# Patient Record
Sex: Male | Born: 1975 | Race: Black or African American | Hispanic: No | Marital: Single | Smoking: Current every day smoker
Health system: Southern US, Community
[De-identification: ages and names within clinical notes are randomized; demographics above are authoritative.]

## PROBLEM LIST (undated history)

## (undated) DIAGNOSIS — L0291 Cutaneous abscess, unspecified: Secondary | ICD-10-CM

## (undated) DIAGNOSIS — I10 Essential (primary) hypertension: Secondary | ICD-10-CM

## (undated) HISTORY — PX: EYE SURGERY: SHX253

---

## 2003-09-13 ENCOUNTER — Other Ambulatory Visit: Payer: Self-pay

## 2008-05-14 ENCOUNTER — Emergency Department: Payer: Self-pay | Admitting: Emergency Medicine

## 2008-05-15 ENCOUNTER — Emergency Department: Payer: Self-pay | Admitting: Emergency Medicine

## 2008-12-04 DIAGNOSIS — H2010 Chronic iridocyclitis, unspecified eye: Secondary | ICD-10-CM | POA: Insufficient documentation

## 2009-10-29 ENCOUNTER — Emergency Department: Payer: Self-pay | Admitting: Emergency Medicine

## 2010-10-12 DIAGNOSIS — B0051 Herpesviral iridocyclitis: Secondary | ICD-10-CM | POA: Insufficient documentation

## 2010-10-12 DIAGNOSIS — H25049 Posterior subcapsular polar age-related cataract, unspecified eye: Secondary | ICD-10-CM | POA: Insufficient documentation

## 2010-10-12 DIAGNOSIS — H35039 Hypertensive retinopathy, unspecified eye: Secondary | ICD-10-CM | POA: Insufficient documentation

## 2010-10-12 DIAGNOSIS — H21549 Posterior synechiae (iris), unspecified eye: Secondary | ICD-10-CM | POA: Insufficient documentation

## 2010-10-12 DIAGNOSIS — H4040X0 Glaucoma secondary to eye inflammation, unspecified eye, stage unspecified: Secondary | ICD-10-CM | POA: Insufficient documentation

## 2011-09-29 ENCOUNTER — Ambulatory Visit: Payer: Self-pay | Admitting: Urology

## 2011-09-29 LAB — CBC
Platelet: 226 10*3/uL (ref 150–440)
RBC: 4.25 10*6/uL — ABNORMAL LOW (ref 4.40–5.90)
WBC: 10.2 10*3/uL (ref 3.8–10.6)

## 2011-09-29 LAB — BASIC METABOLIC PANEL
Anion Gap: 5 — ABNORMAL LOW (ref 7–16)
BUN: 7 mg/dL (ref 7–18)
Calcium, Total: 8.8 mg/dL (ref 8.5–10.1)
Co2: 29 mmol/L (ref 21–32)
Creatinine: 0.75 mg/dL (ref 0.60–1.30)
EGFR (African American): >60

## 2011-10-05 LAB — CULTURE, BLOOD (SINGLE)

## 2012-01-02 ENCOUNTER — Observation Stay: Payer: Self-pay | Admitting: Surgery

## 2012-01-02 LAB — CBC WITH DIFFERENTIAL/PLATELET
Basophil #: 0.1 10*3/uL (ref 0.0–0.1)
Eosinophil #: 0.1 10*3/uL (ref 0.0–0.7)
Eosinophil %: 0.6 %
HGB: 13.8 g/dL (ref 13.0–18.0)
Lymphocyte %: 19.1 %
MCHC: 33.1 g/dL (ref 32.0–36.0)
MCV: 88 fL (ref 80–100)
Monocyte %: 7.1 %
Neutrophil #: 15.3 10*3/uL — ABNORMAL HIGH (ref 1.4–6.5)
Platelet: 244 10*3/uL (ref 150–440)
RDW: 15.4 % — ABNORMAL HIGH (ref 11.5–14.5)
WBC: 21 10*3/uL — ABNORMAL HIGH (ref 3.8–10.6)

## 2012-01-02 LAB — BASIC METABOLIC PANEL
Calcium, Total: 8.8 mg/dL (ref 8.5–10.1)
Chloride: 106 mmol/L (ref 98–107)
Co2: 28 mmol/L (ref 21–32)
Creatinine: 0.79 mg/dL (ref 0.60–1.30)
EGFR (African American): >60
EGFR (Non-African Amer.): >60
Potassium: 3.7 mmol/L (ref 3.5–5.1)
Sodium: 138 mmol/L (ref 136–145)

## 2013-08-11 ENCOUNTER — Emergency Department: Payer: Self-pay | Admitting: Emergency Medicine

## 2013-08-12 LAB — SEDIMENTATION RATE: Erythrocyte Sed Rate: 25 mm/hr — ABNORMAL HIGH (ref 0–15)

## 2013-08-13 ENCOUNTER — Other Ambulatory Visit: Payer: Self-pay | Admitting: Ophthalmology

## 2013-08-13 LAB — GLUCOSE, RANDOM: GLUCOSE: 96 mg/dL (ref 65–99)

## 2013-08-13 LAB — HEMOGLOBIN A1C: HEMOGLOBIN A1C: 6.3 % (ref 4.2–6.3)

## 2014-05-21 NOTE — H&P (Signed)
Subjective/Chief Complaint 2 days pain on buttock.    History of Present Illness 39 year old male with hx HTN, diabetes, glaucoma and supposed hidradenitis presents with 2 days hx of painful swelling around right buttock.  He has not any BP mediciations in some time.  BP in ER SBP 190's.    Past History see abve.    Past Medical Health Hypertension, Diabetes Mellitus    Primary Physician [place].   Past Med/Surgical Hx:  cataracts:   glaucoma:   gerd:   htn:   drain placed in R eye:   ALLERGIES:  ASA: Hives, Swelling    Other Allergies none     Medications none   Family and Social History:   Family History Non-Contributory    Social History negative tobacco, negative ETOH   Review of Systems:   Subjective/Chief Complaint see above    Medications/Allergies Reviewed Medications/Allergies reviewed   Physical Exam:   GEN thin, temp 97.9 p 114, 195/140    HEENT pale conjunctivae    NECK supple    RESP normal resp effort  clear BS    CARD regular rate    ABD denies tenderness  no hernia  soft  large superficial right sided perianal abscess    LYMPH negative neck    SKIN normal to palpation    NEURO cranial nerves intact    PSYCH A+O to time, place, person   Lab Results:  Routine Chem:  01-Dec-13 08:11    Glucose, Serum  118   BUN 8   Creatinine (comp) 0.79   Sodium, Serum 138   Potassium, Serum 3.7   Chloride, Serum 106   CO2, Serum 28   Calcium (Total), Serum 8.8   Anion Gap  4   Osmolality (calc) 275   eGFR (African American) >60   eGFR (Non-African American) >60 (eGFR values <80mL/min/1.73 m2 may be an indication of chronic kidney disease (CKD). Calculated eGFR is useful in patients with stable renal function. The eGFR calculation will not be reliable in acutely ill patients when serum creatinine is changing rapidly. It is not useful in  patients on dialysis. The eGFR calculation may not be applicable to patients at the low  and high extremes of body sizes, pregnant women, and vegetarians.)  Routine Hem:  01-Dec-13 08:11    WBC (CBC)  21.0   RBC (CBC) 4.74   Hemoglobin (CBC) 13.8   Hematocrit (CBC) 41.7   Platelet Count (CBC) 244   MCV 88   MCH 29.1   MCHC 33.1   RDW  15.4   Neutrophil % 72.7   Lymphocyte % 19.1   Monocyte % 7.1   Eosinophil % 0.6   Basophil % 0.5   Neutrophil #  15.3   Lymphocyte #  4.0   Monocyte #  1.5   Eosinophil # 0.1   Basophil # 0.1 (Result(s) reported on 02 Jan 2012 at 08:24AM.)     Assessment/Admission Diagnosis 39 year old male with large right sided perianal abscess and poorly controlled hypertension.    Plan admit, enalipril and IV hydralazine, I and D latter today once BP better controlled.   Electronic Signatures: Sherri Rad (MD)  (Signed 01-Dec-13 14:53)  Authored: CHIEF COMPLAINT and HISTORY, PAST MEDICAL/SURGIAL HISTORY, ALLERGIES, Other Allergies, HOME MEDICATIONS, OTHER MEDICATIONS, FAMILY AND SOCIAL HISTORY, REVIEW OF SYSTEMS, PHYSICAL EXAM, LABS, ASSESSMENT AND PLAN   Last Updated: 01-Dec-13 14:53 by Sherri Rad (MD)

## 2014-05-21 NOTE — Op Note (Signed)
PATIENT NAME:  Joshua Tate, Joshua Tate MR#:  [id number] DATE OF BIRTH:  02/15/1975  DATE OF PROCEDURE:  09/29/2011  PREOPERATIVE DIAGNOSIS: Left scrotal wall abscess.   POSTOPERATIVE DIAGNOSIS: Left scrotal wall abscess.   PROCEDURE: Incision and drainage of scrotal wall abscess.   SURGEON: Scott C. Lonna CobbStoioff, MD  ASSISTANT: None.   ANESTHESIA: MAC.   INDICATION: 39 year old male presented to the Emergency Department with a six day history of a "boil" in his left scrotum. He has had progressive pain and discomfort but no fever or chills. He has a history of hydradenitis suppurativa and has had prior abscesses drained in the thigh and suprapubic area. Physical examination remarkable for approximately 3 cm area of fluctuance and tenderness in the left lateral scrotum.   DESCRIPTION OF PROCEDURE: Patient was taken to the Operating Room, placed in supine position. His external genitalia were prepped and draped in the usual fashion. IV sedation was obtained by anesthesia. The skin was incised overlying the fluctuant area and a moderate amount of slightly blood-tinged purulent material was drained. Intraoperative cultures were obtained. A hemostat was inserted into the abscess cavity and no loculations were identified. The abscess pocket was extended inferiorly and a second incision was made along the inferior aspect of this pocket. The site was copiously irrigated with GU irrigant. The cavity was packed with plain packing gauze. Dressing of fluffs, scrotal support was applied. Patient was taken to PAC-U in stable condition. There were no complications. EBL was minimal.   ____________________________ Verna CzechScott C. Lonna CobbStoioff, MD scs:cms D: 09/29/2011 22:27:14 ET T: 09/30/2011 09:37:52 ET  JOB#: [id number] cc: Lorin PicketScott C. Lonna CobbStoioff, MD, <Dictator> Riki AltesSCOTT C STOIOFF MD ELECTRONICALLY SIGNED 10/08/2011 12:09

## 2014-05-21 NOTE — Op Note (Signed)
PATIENT NAME:  Joshua Tate, Joshua Tate MR#:  [id number] DATE OF BIRTH:  [birth year]  DATE OF PROCEDURE:  01/02/2012  PREOPERATIVE DIAGNOSIS: Perianal abscess.  POSTOPERATIVE DIAGNOSIS: Perianal abscess, likely secondary to hidradenitis suppurativa.  OPERATION PERFORMED:  1. Rectal and anal examination under anesthesia.  2. Anoscopy.  3. Incision and drainage of large perianal abscess.   SURGEON: Claude MangesWilliam F. Rotunda Worden, MD   ANESTHESIA: General.   PROCEDURE IN DETAIL: The patient was placed in the lithotomy position and prepped and draped in the usual sterile fashion. A digital rectal examination failed to reveal any abnormalities or induration within the anal canal. An anoscopy was performed, and there was no obvious crypt or pus or induration within the anal canal, particularly posteriorly. The patient's abscess cavity was in the posterior midline going toward the natal cleft; however, there was no evidence of pit formation or any evidence of pilonidal disease. There was no induration within the lateral aspects of the ischiorectal space. A small cruciate incision was made in the middle of the indurated skin, and a large amount of dark brown odorless pus was drained. The cavity was irrigated with copious amounts of hydrogen peroxide, and hemostasis was achieved with electrocautery. A full roll of 2-inch Kling was then placed into the cavity with just about an inch left hanging out, and 4 x 4's and mesh panties completed the procedure. The patient tolerated the procedure well. There were no complications.  ____________________________ Claude MangesWilliam F. Kathleen Tamm, MD wfm:cbb D: 01/02/2012 20:42:43 ET T: 01/03/2012 09:36:22 ET JOB#: [id number]  cc: Claude MangesWilliam F. Raeden Schippers, MD, <Dictator> Claude MangesWILLIAM F Esias Mory MD ELECTRONICALLY SIGNED 01/12/2012 7:53

## 2014-05-21 NOTE — H&P (Signed)
    Subjective/Chief Complaint left scrotal pain    History of Present Illness Presents with a 5-6 day h/o a "boil" left scrotum which has progressively enlarged and become more painful. Worse with movement, intensity severe.  Denies fever.  Past h/o skin abscesses thigh and lower abd requiring I&D. Has adult onset diabetes on no meds.    Past Medical Health Hypertension, Diabetes Mellitus   Past Med/Surgical Hx:  cataracts:   glaucoma:   gerd:   htn:   drain placed in R eye:   ALLERGIES:  ASA: Hives, Swelling  HOME MEDICATIONS: Medication Instructions Status  Cosopt ophthalmic solution   once a day  Active  cyclogyl   once a day  Active  predaforte   4 times a day  Active  enalapril 40 mg oral tablet   once a day  Active  nexium   once a day  Active   Family and Social History:   Family History Non-Contributory    Social History positive  tobacco    Place of Living Home   Review of Systems:   Subjective/Chief Complaint scrota pain    Fever/Chills No    Cough No    Sputum No    Abdominal Pain No    Diarrhea No    Constipation No    Nausea/Vomiting No    SOB/DOE No    Chest Pain No    Dysuria No    Tolerating Diet Yes    Medications/Allergies Reviewed Medications/Allergies reviewed   Physical Exam:   GEN well developed, well nourished, mid distress secondary to pain    HEENT pink conjunctivae, moist oral mucosa, Oropharynx clear    NECK supple  No masses    RESP clear BS  no use of accessory muscles    CARD regular rate  no murmur  No LE edema    ABD denies tenderness  no liver/spleen enlargement  soft    EXTR negative edema    SKIN No rashes, skin turgor good    NEURO motor/sensory function intact    PSYCH alert, A+O to time, place, person, good insight    Additional Comments GU- penis nml. Testes descended nml.  Fluctuent, tender left scrotal wall abscess laterally measuring ~ 3X5 cm.  no necrosis     Assessment/Admission Diagnosis  left scrotal wall abscess    Plan Incision and drainage- the procedure was discussed including the need for wound care/ dressing changes.  He desires to proceed   Electronic Signatures: Riki AltesStoioff, Denea Cheaney C (MD)  (Signed 28-Aug-13 12:37)  Authored: CHIEF COMPLAINT and HISTORY, PAST MEDICAL/SURGIAL HISTORY, ALLERGIES, HOME MEDICATIONS, FAMILY AND SOCIAL HISTORY, REVIEW OF SYSTEMS, PHYSICAL EXAM, ASSESSMENT AND PLAN   Last Updated: 28-Aug-13 12:37 by Riki AltesStoioff, Koralynn Greenspan C (MD)

## 2015-09-26 ENCOUNTER — Emergency Department: Payer: Medicaid Other

## 2015-09-26 ENCOUNTER — Emergency Department: Payer: Medicaid Other | Admitting: Anesthesiology

## 2015-09-26 ENCOUNTER — Encounter
Admission: EM | Discharge status: Against Medical Advice | Payer: Self-pay | Source: Home / Self Care | Attending: Emergency Medicine

## 2015-09-26 ENCOUNTER — Encounter: Payer: Self-pay | Admitting: Emergency Medicine

## 2015-09-26 ENCOUNTER — Observation Stay
Admission: EM | Admit: 2015-09-26 | Discharge: 2015-09-26 | Discharge status: Against Medical Advice | Payer: Medicaid Other | Attending: General Surgery | Admitting: General Surgery

## 2015-09-26 DIAGNOSIS — K611 Rectal abscess: Principal | ICD-10-CM | POA: Diagnosis present

## 2015-09-26 DIAGNOSIS — F17290 Nicotine dependence, other tobacco product, uncomplicated: Secondary | ICD-10-CM | POA: Diagnosis not present

## 2015-09-26 DIAGNOSIS — L0291 Cutaneous abscess, unspecified: Secondary | ICD-10-CM | POA: Diagnosis present

## 2015-09-26 DIAGNOSIS — Z79899 Other long term (current) drug therapy: Secondary | ICD-10-CM | POA: Diagnosis not present

## 2015-09-26 DIAGNOSIS — I10 Essential (primary) hypertension: Secondary | ICD-10-CM | POA: Diagnosis not present

## 2015-09-26 HISTORY — DX: Essential (primary) hypertension: I10

## 2015-09-26 HISTORY — DX: Cutaneous abscess, unspecified: L02.91

## 2015-09-26 HISTORY — PX: INCISION AND DRAINAGE ABSCESS: SHX5864

## 2015-09-26 LAB — CBC WITH DIFFERENTIAL/PLATELET
Basophils Absolute: 0.1 10*3/uL (ref 0–0.1)
Basophils Relative: 1 %
EOS ABS: 0.2 10*3/uL (ref 0–0.7)
EOS PCT: 2 %
HCT: 37.5 % — ABNORMAL LOW (ref 40.0–52.0)
Hemoglobin: 12.8 g/dL — ABNORMAL LOW (ref 13.0–18.0)
LYMPHS ABS: 2.6 10*3/uL (ref 1.0–3.6)
Lymphocytes Relative: 19 %
MCH: 30.4 pg (ref 26.0–34.0)
MCHC: 34.2 g/dL (ref 32.0–36.0)
MCV: 88.9 fL (ref 80.0–100.0)
MONOS PCT: 8 %
Monocytes Absolute: 1.2 10*3/uL — ABNORMAL HIGH (ref 0.2–1.0)
Neutro Abs: 9.9 10*3/uL — ABNORMAL HIGH (ref 1.4–6.5)
Neutrophils Relative %: 70 %
PLATELETS: 249 10*3/uL (ref 150–440)
RBC: 4.22 MIL/uL — ABNORMAL LOW (ref 4.40–5.90)
RDW: 15.3 % — ABNORMAL HIGH (ref 11.5–14.5)
WBC: 14 10*3/uL — AB (ref 3.8–10.6)

## 2015-09-26 LAB — BASIC METABOLIC PANEL
Anion gap: 8 (ref 5–15)
BUN: 8 mg/dL (ref 6–20)
CALCIUM: 8.3 mg/dL — AB (ref 8.9–10.3)
CO2: 25 mmol/L (ref 22–32)
CREATININE: 0.57 mg/dL — AB (ref 0.61–1.24)
Chloride: 107 mmol/L (ref 101–111)
GFR calc Af Amer: >60 mL/min (ref >60)
Glucose, Bld: 95 mg/dL (ref 65–99)
Potassium: 3.3 mmol/L — ABNORMAL LOW (ref 3.5–5.1)
SODIUM: 140 mmol/L (ref 135–145)

## 2015-09-26 SURGERY — INCISION AND DRAINAGE, ABSCESS
Anesthesia: General | Site: Rectum | Wound class: Contaminated

## 2015-09-26 MED ORDER — FENTANYL CITRATE (PF) 100 MCG/2ML IJ SOLN
INTRAMUSCULAR | Status: AC
  Filled 2015-09-26: qty 2

## 2015-09-26 MED ORDER — LACTATED RINGERS IV SOLN
INTRAVENOUS | Status: DC | PRN
  Administered 2015-09-26: 19:00:00 via INTRAVENOUS

## 2015-09-26 MED ORDER — ONDANSETRON 4 MG PO TBDP: 4.0000 mg | ORAL_TABLET | Freq: Four times a day (QID) | ORAL | Status: DC | PRN

## 2015-09-26 MED ORDER — VANCOMYCIN HCL 10 G IV SOLR
1250.0000 mg | Freq: Once | INTRAVENOUS | Status: DC
  Filled 2015-09-26: qty 1250

## 2015-09-26 MED ORDER — ZOLPIDEM TARTRATE 5 MG PO TABS
5.0000 mg | ORAL_TABLET | Freq: Every evening | ORAL | Status: DC | PRN
Start: 2015-09-26 — End: 2015-09-27

## 2015-09-26 MED ORDER — LIDOCAINE HCL (CARDIAC) 20 MG/ML IV SOLN
INTRAVENOUS | Status: DC | PRN
  Administered 2015-09-26: 100 mg via INTRAVENOUS

## 2015-09-26 MED ORDER — HYDROCODONE-ACETAMINOPHEN 5-325 MG PO TABS: 1.0000 | ORAL_TABLET | ORAL | Status: DC | PRN

## 2015-09-26 MED ORDER — PROMETHAZINE HCL 25 MG/ML IJ SOLN: 6.2500 mg | INTRAMUSCULAR | Status: DC | PRN

## 2015-09-26 MED ORDER — SODIUM CHLORIDE 0.9 % IV SOLN
1250.0000 mg | Freq: Three times a day (TID) | INTRAVENOUS | Status: DC
  Filled 2015-09-26 (×2): qty 1250

## 2015-09-26 MED ORDER — MEPERIDINE HCL 25 MG/ML IJ SOLN: 6.2500 mg | INTRAMUSCULAR | Status: DC | PRN

## 2015-09-26 MED ORDER — DEXAMETHASONE SODIUM PHOSPHATE 10 MG/ML IJ SOLN
INTRAMUSCULAR | Status: DC | PRN
  Administered 2015-09-26: 10 mg via INTRAVENOUS

## 2015-09-26 MED ORDER — MORPHINE SULFATE (PF) 4 MG/ML IV SOLN: 4.0000 mg | INTRAVENOUS | Status: DC | PRN

## 2015-09-26 MED ORDER — MIDAZOLAM HCL 2 MG/2ML IJ SOLN
INTRAMUSCULAR | Status: DC | PRN
Start: 2015-09-26 — End: 2015-09-26
  Administered 2015-09-26: 2 mg via INTRAVENOUS

## 2015-09-26 MED ORDER — DIPHENHYDRAMINE HCL 50 MG/ML IJ SOLN: 25.0000 mg | Freq: Four times a day (QID) | INTRAMUSCULAR | Status: DC | PRN

## 2015-09-26 MED ORDER — DIATRIZOATE MEGLUMINE & SODIUM 66-10 % PO SOLN: 15.0000 mL | Freq: Once | ORAL | Status: DC

## 2015-09-26 MED ORDER — ONDANSETRON HCL 4 MG/2ML IJ SOLN: 4.0000 mg | Freq: Four times a day (QID) | INTRAMUSCULAR | Status: DC | PRN

## 2015-09-26 MED ORDER — MORPHINE SULFATE (PF) 4 MG/ML IV SOLN
4.0000 mg | Freq: Once | INTRAVENOUS | Status: AC
Start: 2015-09-26 — End: 2015-09-26
  Administered 2015-09-26: 4 mg via INTRAVENOUS
  Filled 2015-09-26: qty 1

## 2015-09-26 MED ORDER — CEFAZOLIN SODIUM 1 G IJ SOLR
INTRAMUSCULAR | Status: DC | PRN
  Administered 2015-09-26: 2 g via INTRAMUSCULAR

## 2015-09-26 MED ORDER — MORPHINE SULFATE (PF) 4 MG/ML IV SOLN
4.0000 mg | Freq: Once | INTRAVENOUS | Status: AC
  Administered 2015-09-26: 4 mg via INTRAVENOUS
  Filled 2015-09-26: qty 1

## 2015-09-26 MED ORDER — PIPERACILLIN-TAZOBACTAM 3.375 G IVPB
3.3750 g | Freq: Three times a day (TID) | INTRAVENOUS | Status: DC
  Filled 2015-09-26 (×3): qty 50

## 2015-09-26 MED ORDER — ONDANSETRON HCL 4 MG/2ML IJ SOLN
INTRAMUSCULAR | Status: DC | PRN
  Administered 2015-09-26: 4 mg via INTRAVENOUS

## 2015-09-26 MED ORDER — FENTANYL CITRATE (PF) 100 MCG/2ML IJ SOLN
INTRAMUSCULAR | Status: DC | PRN
  Administered 2015-09-26: 50 ug via INTRAVENOUS
  Administered 2015-09-26 (×2): 25 ug via INTRAVENOUS
  Administered 2015-09-26: 100 ug via INTRAVENOUS

## 2015-09-26 MED ORDER — IOPAMIDOL (ISOVUE-300) INJECTION 61%
100.0000 mL | Freq: Once | INTRAVENOUS | Status: AC | PRN
  Administered 2015-09-26: 100 mL via INTRAVENOUS

## 2015-09-26 MED ORDER — HYDRALAZINE HCL 20 MG/ML IJ SOLN: 10.0000 mg | INTRAMUSCULAR | Status: DC | PRN

## 2015-09-26 MED ORDER — OXYCODONE HCL 5 MG PO TABS: 5.0000 mg | ORAL_TABLET | Freq: Once | ORAL | Status: DC | PRN

## 2015-09-26 MED ORDER — LACTATED RINGERS IV SOLN: INTRAVENOUS | Status: DC

## 2015-09-26 MED ORDER — FENTANYL CITRATE (PF) 100 MCG/2ML IJ SOLN
25.0000 ug | INTRAMUSCULAR | Status: DC | PRN
  Administered 2015-09-26 (×4): 25 ug via INTRAVENOUS

## 2015-09-26 MED ORDER — SODIUM CHLORIDE 0.9 % IV BOLUS (SEPSIS)
1000.0000 mL | Freq: Once | INTRAVENOUS | Status: AC
  Administered 2015-09-26: 1000 mL via INTRAVENOUS

## 2015-09-26 MED ORDER — PROPOFOL 10 MG/ML IV BOLUS
INTRAVENOUS | Status: DC | PRN
  Administered 2015-09-26: 200 mg via INTRAVENOUS

## 2015-09-26 MED ORDER — DIATRIZOATE MEGLUMINE & SODIUM 66-10 % PO SOLN
30.0000 mL | ORAL | Status: AC
Start: 2015-09-26 — End: 2015-09-26
  Administered 2015-09-26: 30 mL via ORAL

## 2015-09-26 MED ORDER — OXYCODONE HCL 5 MG/5ML PO SOLN: 5.0000 mg | Freq: Once | ORAL | Status: DC | PRN

## 2015-09-26 MED ORDER — DIPHENHYDRAMINE HCL 25 MG PO CAPS: 25.0000 mg | ORAL_CAPSULE | Freq: Four times a day (QID) | ORAL | Status: DC | PRN

## 2015-09-26 SURGICAL SUPPLY — 22 items
BLADE CLIPPER SURG (BLADE) IMPLANT
BLADE SURG 15 STRL LF DISP TIS (BLADE) ×1 IMPLANT
BLADE SURG 15 STRL SS (BLADE) ×2
BRIEF STRETCH MATERNITY 2XLG (MISCELLANEOUS) ×3 IMPLANT
CANISTER SUCT 3000ML (MISCELLANEOUS) ×3 IMPLANT
DRAIN PENROSE 1/4X12 LTX (DRAIN) ×3 IMPLANT
ELECT REM PT RETURN 9FT ADLT (ELECTROSURGICAL) ×3
ELECTRODE REM PT RTRN 9FT ADLT (ELECTROSURGICAL) ×1 IMPLANT
GLOVE BIO SURGEON STRL SZ7 (GLOVE) ×12 IMPLANT
GOWN STRL REUS W/ TWL LRG LVL3 (GOWN DISPOSABLE) ×2 IMPLANT
GOWN STRL REUS W/TWL LRG LVL3 (GOWN DISPOSABLE) ×4
NEEDLE HYPO 22GX1.5 SAFETY (NEEDLE) ×3 IMPLANT
NS IRRIG 1000ML POUR BTL (IV SOLUTION) ×3 IMPLANT
PACK BASIN MINOR ARMC (MISCELLANEOUS) ×3 IMPLANT
PAD ABD DERMACEA PRESS 5X9 (GAUZE/BANDAGES/DRESSINGS) ×3 IMPLANT
SCRUB POVIDONE IODINE 4 OZ (MISCELLANEOUS) IMPLANT
SOL PREP PVP 2OZ (MISCELLANEOUS) ×3
SOLUTION PREP PVP 2OZ (MISCELLANEOUS) ×1 IMPLANT
SUT ETHILON 3-0 FS-10 30 BLK (SUTURE) ×3
SUTURE EHLN 3-0 FS-10 30 BLK (SUTURE) ×1 IMPLANT
SWAB CULTURE AMIES ANAERIB BLU (MISCELLANEOUS) ×3 IMPLANT
SYR BULB EAR ULCER 3OZ GRN STR (SYRINGE) ×3 IMPLANT

## 2015-09-26 NOTE — Anesthesia Preprocedure Evaluation (Signed)
Anesthesia Evaluation  Patient identified by MRN, date of birth, ID band Patient awake    Reviewed: Allergy & Precautions, NPO status , Patient's Chart, lab work & pertinent test results  History of Anesthesia Complications Negative for: history of anesthetic complications  Airway Mallampati: II  TM Distance: >3 FB Neck ROM: Full    Dental  (+) Poor Dentition   Pulmonary neg sleep apnea, neg COPD, Current Smoker,    breath sounds clear to auscultation- rhonchi (-) decreased breath sounds(-) wheezing      Cardiovascular hypertension, (-) CAD and (-) Past MI  Rhythm:Regular Rate:Normal - Systolic murmurs and - Diastolic murmurs    Neuro/Psych negative neurological ROS  negative psych ROS   GI/Hepatic negative GI ROS, Neg liver ROS,   Endo/Other  negative endocrine ROSneg diabetes  Renal/GU negative Renal ROS     Musculoskeletal negative musculoskeletal ROS (+)   Abdominal (+) - obese,   Peds  Hematology negative hematology ROS (+)   Anesthesia Other Findings   Reproductive/Obstetrics                             Anesthesia Physical Anesthesia Plan  ASA: II  Anesthesia Plan: General   Post-op Pain Management:    Induction: Intravenous  Airway Management Planned: LMA  Additional Equipment:   Intra-op Plan:   Post-operative Plan:   Informed Consent: I have reviewed the patients History and Physical, chart, labs and discussed the procedure including the risks, benefits and alternatives for the proposed anesthesia with the patient or authorized representative who has indicated his/her understanding and acceptance.   Dental advisory given  Plan Discussed with: Anesthesiologist and CRNA  Anesthesia Plan Comments:         Anesthesia Quick Evaluation

## 2015-09-26 NOTE — Op Note (Signed)
   Pre-operative Diagnosis: Perirectal abscess  Post-operative Diagnosis: Same  Surgeon: Joshua Frameharles Khyrin Trevathan   Assistants: None  Anesthesia: General LMA anesthesia  ASA Class: 2  Surgeon: Joshua Frameharles Avaleen Brownley, MD FACS  Anesthesia: Gen. with endotracheal tube  Assistant: None  Procedure Details  The patient was seen again in the Holding Room. The benefits, complications, treatment options, and expected outcomes were discussed with the patient. The risks of bleeding, infection, recurrence of symptoms, failure to resolve symptoms, any of which could require further surgery were reviewed with the patient.   The patient was taken to Operating Room, identified as Joshua Tate and the procedure verified.  A Time Out was held and the above information confirmed.  Prior to the induction of general anesthesia, antibiotic prophylaxis was administered. VTE prophylaxis was in place. General endotracheal anesthesia was then administered and tolerated well. After the induction, the perineum was prepped with Betadine and draped in the sterile fashion. The patient was positioned in the high lithotomy position.  The obvious abscess was again palpated for its borders. The most inferior aspect was then incised with a 15 blade scalpel. Purulent material was immediately returned and was cultured. A counterincision was made on the superior aspect. Through these 2 incisions the abscess was decompressed without any difficulty. The entire cavity was then copiously irrigated with normal saline. Due to the size of the abscess the decision was made to place a Penrose drain that traversed the abscess exiting both stab wounds. The Penrose drain was sewn into place with a 3-0 nylon suture.  With the abscess drained patient was returned to a supine position. A sterile dressing of ABD pads and mesh underwear was placed over this. Patient was awoken from general anesthesia and transferred to the PACU in good condition. All counts  were correct at the end of the procedure. There were no immediate complications.  Findings: Large left-sided perirectal abscess   Estimated Blood Loss: 10 mL         Drains: Quarter-inch Penrose drain         Specimens: Culture for abscess          Complications: None                  Condition: Good   Joshua Frameharles Olney Monier, MD, FACS

## 2015-09-26 NOTE — Progress Notes (Signed)
Pt states he is leaving, nursing supervisor in to see pt explained risks, nursing staff explained risks to leaving, bleeding potential, risk of infection, iv removed, AMA form signed.

## 2015-09-26 NOTE — Anesthesia Procedure Notes (Signed)
Procedure Name: LMA Insertion Date/Time: 09/26/2015 7:51 PM Performed by: Junious SilkNOLES, Julieanna Geraci Pre-anesthesia Checklist: Patient identified, Patient being monitored, Timeout performed, Emergency Drugs available and Suction available Patient Re-evaluated:Patient Re-evaluated prior to inductionOxygen Delivery Method: Circle system utilized Preoxygenation: Pre-oxygenation with 100% oxygen Intubation Type: IV induction Ventilation: Mask ventilation without difficulty LMA: LMA inserted LMA Size: 4.5 Tube type: Oral Number of attempts: 1 Placement Confirmation: positive ETCO2 and breath sounds checked- equal and bilateral Tube secured with: Tape Dental Injury: Teeth and Oropharynx as per pre-operative assessment

## 2015-09-26 NOTE — ED Notes (Signed)
Dr. Everlene FarrierPabon at bedside with patient consenting him for surgery.

## 2015-09-26 NOTE — ED Triage Notes (Signed)
Pt states he has hx of abscess to rectal area and gets them every 3 months or so. Pt states sometimes he gets them drained other times he has to have surgery.  Current abscess started about 2 days ago and pt states "this is the worst one yet" Pt unable to sit without pain. Denies any hx of MRSA.  Pt states the pain is 10/10

## 2015-09-26 NOTE — ED Provider Notes (Signed)
[facility] Emergency Department Provider Note    ____________________________________________   I have reviewed the triage vital signs and the nursing notes.   HISTORY  Chief Complaint Abscess   History limited by: Not Limited   HPI Joshua Tate is a 40 y.o. male who presents to the emergency department today because of concerns for groin pain and swelling. The patient states that he started developing an abscess roughly 3 days ago. It is located in the perineum. He states he has a history of abscesses in this area and gets them roughly once every 3 months. He is usually able to treat him at home with warm compresses. He tried that without any relief. He also states that he has been having some pain in his scrotum and some difficulty with urination. He denies any fevers.   Past Medical History:  Diagnosis Date  . Abscess    to rectum  . Hypertension     There are no active problems to display for this patient.   History reviewed. No pertinent surgical history.  Prior to Admission medications   Not on File    Allergies Review of patient's allergies indicates no known allergies.  History reviewed. No pertinent family history.  Social History Social History  Substance Use Topics  . Smoking status: Current Every Day Smoker    Types: Cigars  . Smokeless tobacco: Never Used  . Alcohol use Yes    Review of Systems  Constitutional: Negative for fever. Cardiovascular: Negative for chest pain. Respiratory: Negative for shortness of breath. Gastrointestinal: Negative for abdominal pain, vomiting and diarrhea. Genitourinary: Positive for scrotal pain, perineum swelling Neurological: Negative for headaches, focal weakness or numbness.  10-point ROS otherwise negative.  ____________________________________________   PHYSICAL EXAM:  VITAL SIGNS: ED Triage Vitals  Enc Vitals Group     BP 09/26/15 0737 (!) 164/117     Pulse Rate  09/26/15 0737 97     Resp 09/26/15 0737 (!) 22     Temp 09/26/15 0737 97.6 F (36.4 C)     Temp Source 09/26/15 0737 Oral     SpO2 09/26/15 0737 98 %     Weight 09/26/15 0737 185 lb (83.9 kg)     Height 09/26/15 0737 6' (1.829 m)     Head Circumference --      Peak Flow --      Pain Score 09/26/15 0738 10   Constitutional: Alert and oriented. Well appearing and in no distress. Eyes: Conjunctivae are normal. PERRL. Normal extraocular movements. ENT   Head: Normocephalic and atraumatic.   Nose: No congestion/rhinnorhea.   Mouth/Throat: Mucous membranes are moist.   Neck: No stridor. Hematological/Lymphatic/Immunilogical: No cervical lymphadenopathy. Cardiovascular: Normal rate, regular rhythm.  No murmurs, rubs, or gallops. Respiratory: Normal respiratory effort without tachypnea nor retractions. Breath sounds are clear and equal bilaterally. No wheezes/rales/rhonchi. Gastrointestinal: Soft and nontender. No distention. There is no CVA tenderness. Genitourinary: Deferred Musculoskeletal: Normal range of motion in all extremities. No joint effusions.  No lower extremity tenderness nor edema. Neurologic:  Normal speech and language. No gross focal neurologic deficits are appreciated.  Skin:  Skin is warm, dry and intact. No rash noted. Psychiatric: Mood and affect are normal. Speech and behavior are normal. Patient exhibits appropriate insight and judgment.  ____________________________________________    LABS (pertinent positives/negatives)  Labs Reviewed  CBC WITH DIFFERENTIAL/PLATELET - Abnormal; Notable for the following:       Result Value   WBC 14.0 (*)  RBC 4.22 (*)    Hemoglobin 12.8 (*)    HCT 37.5 (*)    RDW 15.3 (*)    Neutro Abs 9.9 (*)    Monocytes Absolute 1.2 (*)    All other components within normal limits  BASIC METABOLIC PANEL - Abnormal; Notable for the following:    Potassium 3.3 (*)    Creatinine, Ser 0.57 (*)    Calcium 8.3 (*)    All  other components within normal limits     ____________________________________________   EKG  None  ____________________________________________    RADIOLOGY  CT pelvis   IMPRESSION:  Irregular low-density area within the left scrotum concerning for  abscess. Surrounding inflammatory change in the left scrotum and  extending into the left perineum, likely cellulitis.    Cardiomegaly.    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     ____________________________________________   PROCEDURES  Procedures  ____________________________________________   INITIAL IMPRESSION / ASSESSMENT AND PLAN / ED COURSE  Pertinent labs & imaging results that were available during my care of the patient were reviewed by me and considered in my medical decision making (see chart for details).  Patient presents to the emergency department today because of concerns for abscess in the perineum. Does have a large fluctuant mass there. CT scan is consistent with an abscess. Patient will be admitted by surgery.   ____________________________________________   FINAL CLINICAL IMPRESSION(S) / ED DIAGNOSES  Final diagnoses:  Abscess     Note: This dictation was prepared with Dragon dictation. Any transcriptional errors that result from this process are unintentional    Phineas Semen, MD 09/26/15 1247

## 2015-09-26 NOTE — Anesthesia Postprocedure Evaluation (Signed)
Anesthesia Post Note  Patient: Joshua HousekeeperJohn G Tate  Procedure(s) Performed: Procedure(s) (LRB): INCISION AND DRAINAGE perirectal  ABSCESS (N/A)  Patient location during evaluation: PACU Anesthesia Type: General Level of consciousness: awake and alert and oriented Pain management: pain level controlled Vital Signs Assessment: post-procedure vital signs reviewed and stable Respiratory status: spontaneous breathing, nonlabored ventilation and respiratory function stable Cardiovascular status: blood pressure returned to baseline and stable Postop Assessment: no signs of nausea or vomiting Anesthetic complications: no    Last Vitals:  Vitals:   09/26/15 2100 09/26/15 2105  BP: (!) 156/97 (!) 162/104  Pulse: 88 97  Resp: (!) 22 (!) 24  Temp:      Last Pain:  Vitals:   09/26/15 2100  TempSrc:   PainSc: 2                  Lonie Rummell

## 2015-09-26 NOTE — ED Notes (Signed)
RN to room to update patient. Patient was upset that he has been waiting so long to go to surgery. I explained to patient that the surgeon had an emergency come up and that per OR he should be the next patient to be taken. Patient was c/o dry mouth. RN gave patient mouth swabs and mouth moisturizer.

## 2015-09-26 NOTE — Transfer of Care (Signed)
Immediate Anesthesia Transfer of Care Note  Patient: Joshua HousekeeperJohn G Tate  Procedure(s) Performed: Procedure(s): INCISION AND DRAINAGE perirectal  ABSCESS (N/A)  Patient Location: PACU  Anesthesia Type:General  Level of Consciousness: sedated  Airway & Oxygen Therapy: Patient Spontanous Breathing and Patient connected to face mask oxygen  Post-op Assessment: Report given to RN and Post -op Vital signs reviewed and stable  Post vital signs: Reviewed and stable  Last Vitals:  Vitals:   09/26/15 2009 09/26/15 2010  BP:  128/88  Pulse: 95 96  Resp: (!) 26 (!) 26  Temp: 36.8 C     Last Pain:  Vitals:   09/26/15 1600  TempSrc:   PainSc: 4          Complications: No apparent anesthesia complications

## 2015-09-26 NOTE — Progress Notes (Signed)
ANTIBIOTIC CONSULT NOTE - INITIAL  Pharmacy Consult for Vancomycin, Zosyn  Indication: perirectal abscess  No Known Allergies  Patient Measurements: Height: 6' (182.9 cm) Weight: 185 lb (83.9 kg) IBW/kg (Calculated) : 77.6 Adjusted Body Weight: 80.12 kg   Vital Signs: Temp: 98.1 F (36.7 C) (08/25 2119) Temp Source: Oral (08/25 2119) BP: 158/91 (08/25 2119) Pulse Rate: 94 (08/25 2119) Intake/Output from previous day: No intake/output data recorded. Intake/Output from this shift: Total I/O In: 400 [I.V.:400] Out: 10 [Blood:10]  Labs:  Recent Labs  09/26/15 0849  WBC 14.0*  HGB 12.8*  PLT 249  CREATININE 0.57*   Estimated Creatinine Clearance: 133.4 mL/min (by C-G formula based on SCr of 0.8 mg/dL). No results for input(s): VANCOTROUGH, VANCOPEAK, VANCORANDOM, GENTTROUGH, GENTPEAK, GENTRANDOM, TOBRATROUGH, TOBRAPEAK, TOBRARND, AMIKACINPEAK, AMIKACINTROU, AMIKACIN in the last 72 hours.   Microbiology: No results found for this or any previous visit (from the past 720 hour(s)).  Medical History: Past Medical History:  Diagnosis Date  . Abscess    to rectum  . Hypertension     Medications:  Prescriptions Prior to Admission  Medication Sig Dispense Refill Last Dose  . naproxen sodium (ANAPROX) 220 MG tablet Take 220 mg by mouth 2 (two) times daily as needed.   prn at prn   Assessment: CrCl = 110 ml/min Ke = 0.096 hrs-1 T1/2 = 7.2 hrs Vd = 58.7 L   No Pseudomonas risk factors noted.   Goal of Therapy:  Vancomycin trough level 15-20 mcg/ml  Plan:  Expected duration 7 days with resolution of temperature and/or normalization of WBC   Zosyn 3.375 gm IV Q8H EI ordered to start on 8/25 @ 22:00.  Vancomycin 1250 mg IV X 1 ordered to be given on 8/25 @ 23:00. Vancomycin 1250 mg IV Q8H ordered to start on 8/26 @ 0500, 6 hrs after 1st dose (stacked dosing). This pt will reach Css by 8/27 @ 1100. Will draw 1st trough on 8/27 @ 0430, which will be very close to  Css.   Myriam Brandhorst D 09/26/2015,9:54 PM

## 2015-09-26 NOTE — Progress Notes (Signed)
  Reviewed case with Dr. Dahlia Byes. Patient with large perirectal abscess.  Met patient and discussed plan with patient who did not voice any concerns or questions  To OR for drainage.  Clayburn Pert, MD [facility]

## 2015-09-26 NOTE — Brief Op Note (Signed)
09/26/2015  8:03 PM  PATIENT:  Adonis HousekeeperJohn G Schirm  40 y.o. male  PRE-OPERATIVE DIAGNOSIS:  perirectal abscess  POST-OPERATIVE DIAGNOSIS:  perirectal abscess  PROCEDURE:  Procedure(s): INCISION AND DRAINAGE perirectal  ABSCESS (N/A)  SURGEON:  Surgeon(s) and Role:    * Diego Ronnette JuniperF Pabon, MD - Primary    * Ricarda Frameharles Cherica Heiden, MD - Co-Surgeon (only for TAVR, EVAR, and Barostim procedure)  PHYSICIAN ASSISTANT:   ASSISTANTS: none   ANESTHESIA:   general  EBL:  No intake/output data recorded.  BLOOD ADMINISTERED:none  DRAINS: Penrose drain in the left perirectal abscess   LOCAL MEDICATIONS USED:  NONE  SPECIMEN:  Source of Specimen:  culture of abscess fluid  DISPOSITION OF SPECIMEN:  PATHOLOGY  COUNTS:  YES  TOURNIQUET:  * No tourniquets in log *  DICTATION: .Dragon Dictation  PLAN OF CARE: Admit for overnight observation  PATIENT DISPOSITION:  PACU - hemodynamically stable.   Delay start of Pharmacological VTE agent (>24hrs) due to surgical blood loss or risk of bleeding: not applicable

## 2015-09-26 NOTE — Progress Notes (Signed)
  Discussed with the patient in the PACU the findings a large perirectal abscess requiring a drain replaced. Described with him the size of the abscess and that it is my medical opinion that he needs IV antibiotics for this for at least overnight prior to being discharged. Discussed that I would be by to see him later this evening.  Patient refused to stay and signed out AMA while I was evaluating the patient in the emergency department.  Ricarda Frameharles Larina Lieurance, MD [facility] General Surgeon [facility]

## 2015-09-26 NOTE — Consult Note (Signed)
Patient ID: Joshua Tate, male   DOB: 1975-04-23, 40 y.o.   MRN: [id number]  HPI Joshua Tate is a 40 y.o. male asked seen in consultation by Dr. Derrill KayGoodman. He reports severe anorectal pain as well as scrotal pain for the last 3 days and a bulging sensation. He reports some low-grade temperatures. No fevers or chills. No other constitutional symptoms. He reports also that his pain worsens when he moves around. Over the last day or so his pain is severe and constant. He has had multiple perianal abscesses and skin abscesses throughout his life and Dr. Sharlene MottsBerg from our practice did an I&D several years ago. A workup as CT scan was performed and a half personally reviewed there is evidence of perianal abscess extending into the left scrotum was significant inflammatory response. There is no evidence of necrotizing fasciitis or Fournier's gangrene WBC is elevated HPI  Past Medical History:  Diagnosis Date  . Abscess    to rectum  . Hypertension     History reviewed. No pertinent surgical history.  History reviewed. No pertinent family history.  Social History Social History  Substance Use Topics  . Smoking status: Current Every Day Smoker    Types: Cigars  . Smokeless tobacco: Never Used  . Alcohol use Yes    No Known Allergies  No current facility-administered medications for this encounter.    Current Outpatient Prescriptions  Medication Sig Dispense Refill  . naproxen sodium (ANAPROX) 220 MG tablet Take 220 mg by mouth 2 (two) times daily as needed.       Review of Systems A 10 point review of systems was asked and was negative except for the information on the HPI  Physical Exam Blood pressure (!) 175/122, pulse 81, temperature 97.6 F (36.4 C), temperature source Oral, resp. rate 16, height 6' (1.829 m), weight 83.9 kg (185 lb), SpO2 99 %. CONSTITUTIONAL: NAd, non toxic EYES: Pupils are equal, round, and reactive to light, Sclera are non-icteric. EARS, NOSE, MOUTH AND  THROAT: The oropharynx is clear. The oral mucosa is pink and moist. Hearing is intact to voice. LYMPH NODES:  Lymph nodes in the neck are normal. RESPIRATORY:  Lungs are clear. There is normal respiratory effort, with equal breath sounds bilaterally, and without pathologic use of accessory muscles. CARDIOVASCULAR: Heart is regular without murmurs, gallops, or rubs. GI: The abdomen is soft, nontender, and nondistended. There are no palpable masses. There is no hepatosplenomegaly. There are normal bowel sounds in all quadrants. GU and Rectal:  There is a large perianal abscess extending into the left scrotum. There is no evidence of subcutaneous emphysema or necrotizing infection. This is was very tender to palpation. MUSCULOSKELETAL: Normal muscle strength and tone. No cyanosis or edema.   SKIN: Turgor is good and there are no pathologic skin lesions or ulcers. NEUROLOGIC: Motor and sensation is grossly normal. Cranial nerves are grossly intact. PSYCH:  Oriented to person, place and time. Affect is normal.  Data Reviewed  I have personally reviewed the patient's imaging, laboratory findings and medical records.    Assessment/Plan Large perianal abscess extending into the left scrotum in need for I&D. Urology has refer the case to assess since it also involves the perineum and perianal area. No evidence of necrotizing infection. He needs an I&D today and exam under anesthesia. An we will also start antibiotics and IV fluids. Procedure discussed with the patient in detail, risk, benefits and possible complications including but not limited to: Leading, worsening of symptoms,  prolonged pain, prolonged wound care, re-interventions and injury to adjacent structures. He understands and wishes to proceed.   Sterling Big, MD FACS General Surgeon 09/26/2015, 1:02 PM

## 2015-09-27 NOTE — Progress Notes (Signed)
Pt encouraged to stay at least for iv antibiotics, refused.Pt left AMA.

## 2015-09-29 ENCOUNTER — Encounter: Payer: Self-pay | Admitting: Surgery

## 2015-09-29 LAB — AEROBIC CULTURE  (SUPERFICIAL SPECIMEN)

## 2015-09-29 LAB — AEROBIC CULTURE W GRAM STAIN (SUPERFICIAL SPECIMEN): Culture: NO GROWTH

## 2015-10-02 LAB — ANAEROBIC CULTURE

## 2015-11-10 ENCOUNTER — Encounter
Admission: EM | Disposition: A | Discharge status: Home / Self Care | Payer: Self-pay | Source: Home / Self Care | Attending: Internal Medicine

## 2015-11-10 ENCOUNTER — Inpatient Hospital Stay
Admission: EM | Admit: 2015-11-10 | Discharge: 2015-11-12 | DRG: 727 | Disposition: A | Discharge status: Home / Self Care | Payer: Medicaid Other | Attending: Internal Medicine | Admitting: Internal Medicine

## 2015-11-10 ENCOUNTER — Encounter: Payer: Self-pay | Admitting: Emergency Medicine

## 2015-11-10 ENCOUNTER — Observation Stay: Payer: Medicaid Other | Admitting: Anesthesiology

## 2015-11-10 ENCOUNTER — Observation Stay (HOSPITAL_BASED_OUTPATIENT_CLINIC_OR_DEPARTMENT_OTHER)
Admit: 2015-11-10 | Discharge: 2015-11-10 | Disposition: A | Discharge status: Home / Self Care | Payer: Medicaid Other | Attending: Physician Assistant | Admitting: Physician Assistant

## 2015-11-10 ENCOUNTER — Emergency Department: Payer: Medicaid Other

## 2015-11-10 DIAGNOSIS — I110 Hypertensive heart disease with heart failure: Secondary | ICD-10-CM | POA: Diagnosis present

## 2015-11-10 DIAGNOSIS — I5021 Acute systolic (congestive) heart failure: Secondary | ICD-10-CM

## 2015-11-10 DIAGNOSIS — R9431 Abnormal electrocardiogram [ECG] [EKG]: Secondary | ICD-10-CM | POA: Diagnosis present

## 2015-11-10 DIAGNOSIS — I208 Other forms of angina pectoris: Secondary | ICD-10-CM | POA: Diagnosis present

## 2015-11-10 DIAGNOSIS — Z833 Family history of diabetes mellitus: Secondary | ICD-10-CM

## 2015-11-10 DIAGNOSIS — Z7982 Long term (current) use of aspirin: Secondary | ICD-10-CM

## 2015-11-10 DIAGNOSIS — Z9889 Other specified postprocedural states: Secondary | ICD-10-CM

## 2015-11-10 DIAGNOSIS — I513 Intracardiac thrombosis, not elsewhere classified: Secondary | ICD-10-CM | POA: Diagnosis present

## 2015-11-10 DIAGNOSIS — F17290 Nicotine dependence, other tobacco product, uncomplicated: Secondary | ICD-10-CM | POA: Diagnosis present

## 2015-11-10 DIAGNOSIS — I429 Cardiomyopathy, unspecified: Secondary | ICD-10-CM | POA: Diagnosis present

## 2015-11-10 DIAGNOSIS — I10 Essential (primary) hypertension: Secondary | ICD-10-CM

## 2015-11-10 DIAGNOSIS — L732 Hidradenitis suppurativa: Secondary | ICD-10-CM | POA: Diagnosis present

## 2015-11-10 DIAGNOSIS — E1159 Type 2 diabetes mellitus with other circulatory complications: Secondary | ICD-10-CM | POA: Diagnosis not present

## 2015-11-10 DIAGNOSIS — N492 Inflammatory disorders of scrotum: Principal | ICD-10-CM | POA: Diagnosis present

## 2015-11-10 DIAGNOSIS — Z79899 Other long term (current) drug therapy: Secondary | ICD-10-CM

## 2015-11-10 DIAGNOSIS — Z8249 Family history of ischemic heart disease and other diseases of the circulatory system: Secondary | ICD-10-CM | POA: Diagnosis not present

## 2015-11-10 DIAGNOSIS — R748 Abnormal levels of other serum enzymes: Secondary | ICD-10-CM | POA: Diagnosis present

## 2015-11-10 DIAGNOSIS — E119 Type 2 diabetes mellitus without complications: Secondary | ICD-10-CM | POA: Diagnosis present

## 2015-11-10 DIAGNOSIS — L02215 Cutaneous abscess of perineum: Secondary | ICD-10-CM | POA: Diagnosis present

## 2015-11-10 DIAGNOSIS — R351 Nocturia: Secondary | ICD-10-CM | POA: Diagnosis present

## 2015-11-10 DIAGNOSIS — K611 Rectal abscess: Secondary | ICD-10-CM | POA: Diagnosis present

## 2015-11-10 DIAGNOSIS — I5023 Acute on chronic systolic (congestive) heart failure: Secondary | ICD-10-CM | POA: Diagnosis present

## 2015-11-10 DIAGNOSIS — I255 Ischemic cardiomyopathy: Secondary | ICD-10-CM | POA: Diagnosis not present

## 2015-11-10 HISTORY — PX: INCISION AND DRAINAGE ABSCESS: SHX5864

## 2015-11-10 LAB — GLUCOSE, CAPILLARY
GLUCOSE-CAPILLARY: 114 mg/dL — AB (ref 65–99)
Glucose-Capillary: 256 mg/dL — ABNORMAL HIGH (ref 65–99)

## 2015-11-10 LAB — BASIC METABOLIC PANEL
ANION GAP: 7 (ref 5–15)
BUN: 10 mg/dL (ref 6–20)
CALCIUM: 8.9 mg/dL (ref 8.9–10.3)
CO2: 27 mmol/L (ref 22–32)
Chloride: 109 mmol/L (ref 101–111)
Creatinine, Ser: 0.88 mg/dL (ref 0.61–1.24)
Glucose, Bld: 121 mg/dL — ABNORMAL HIGH (ref 65–99)
Potassium: 4 mmol/L (ref 3.5–5.1)
Sodium: 143 mmol/L (ref 135–145)

## 2015-11-10 LAB — CBC WITH DIFFERENTIAL/PLATELET
BASOS ABS: 0.1 10*3/uL (ref 0–0.1)
BASOS PCT: 0 %
Eosinophils Absolute: 0.1 10*3/uL (ref 0–0.7)
Eosinophils Relative: 0 %
HEMATOCRIT: 41 % (ref 40.0–52.0)
HEMOGLOBIN: 13.4 g/dL (ref 13.0–18.0)
Lymphocytes Relative: 13 %
Lymphs Abs: 2.6 10*3/uL (ref 1.0–3.6)
MCH: 29.1 pg (ref 26.0–34.0)
MCHC: 32.8 g/dL (ref 32.0–36.0)
MCV: 88.8 fL (ref 80.0–100.0)
Monocytes Absolute: 1.5 10*3/uL — ABNORMAL HIGH (ref 0.2–1.0)
Monocytes Relative: 7 %
NEUTROS ABS: 16.1 10*3/uL — AB (ref 1.4–6.5)
NEUTROS PCT: 80 %
Platelets: 260 10*3/uL (ref 150–440)
RBC: 4.61 MIL/uL (ref 4.40–5.90)
RDW: 15.4 % — ABNORMAL HIGH (ref 11.5–14.5)
WBC: 20.3 10*3/uL — ABNORMAL HIGH (ref 3.8–10.6)

## 2015-11-10 LAB — TROPONIN I
TROPONIN I: 0.05 ng/mL — AB (ref <0.03)
Troponin I: 0.05 ng/mL (ref <0.03)

## 2015-11-10 LAB — ECHOCARDIOGRAM COMPLETE
Height: 72 in
Weight: 2960 oz

## 2015-11-10 LAB — HEPARIN LEVEL (UNFRACTIONATED): HEPARIN UNFRACTIONATED: 0.24 [IU]/mL — AB (ref 0.30–0.70)

## 2015-11-10 SURGERY — INCISION AND DRAINAGE, ABSCESS
Anesthesia: Choice

## 2015-11-10 MED ORDER — NITROGLYCERIN 2 % TD OINT
TOPICAL_OINTMENT | TRANSDERMAL | Status: AC
  Administered 2015-11-10: 1 [in_us] via TOPICAL
  Filled 2015-11-10: qty 1

## 2015-11-10 MED ORDER — INSULIN ASPART 100 UNIT/ML ~~LOC~~ SOLN
0.0000 [IU] | Freq: Three times a day (TID) | SUBCUTANEOUS | Status: DC
  Administered 2015-11-11 – 2015-11-12 (×2): 2 [IU] via SUBCUTANEOUS
  Filled 2015-11-10 (×2): qty 2

## 2015-11-10 MED ORDER — ASPIRIN 81 MG PO CHEW
CHEWABLE_TABLET | ORAL | Status: AC
  Administered 2015-11-10: 324 mg via ORAL
  Filled 2015-11-10: qty 4

## 2015-11-10 MED ORDER — CLINDAMYCIN PHOSPHATE 600 MG/50ML IV SOLN
600.0000 mg | Freq: Once | INTRAVENOUS | Status: AC
  Administered 2015-11-10: 600 mg via INTRAVENOUS
  Filled 2015-11-10: qty 50

## 2015-11-10 MED ORDER — BUPIVACAINE HCL 0.5 % IJ SOLN
INTRAMUSCULAR | Status: DC | PRN
  Administered 2015-11-10: 5 mL

## 2015-11-10 MED ORDER — DEXAMETHASONE SODIUM PHOSPHATE 10 MG/ML IJ SOLN
INTRAMUSCULAR | Status: DC | PRN
  Administered 2015-11-10: 10 mg via INTRAVENOUS

## 2015-11-10 MED ORDER — ATORVASTATIN CALCIUM 20 MG PO TABS
40.0000 mg | ORAL_TABLET | Freq: Every day | ORAL | Status: DC
  Administered 2015-11-11: 40 mg via ORAL
  Filled 2015-11-10: qty 2

## 2015-11-10 MED ORDER — LACTATED RINGERS IV SOLN
INTRAVENOUS | Status: DC | PRN
  Administered 2015-11-10: 13:00:00 via INTRAVENOUS

## 2015-11-10 MED ORDER — PROPOFOL 10 MG/ML IV BOLUS
INTRAVENOUS | Status: DC | PRN
  Administered 2015-11-10: 30 mg via INTRAVENOUS
  Administered 2015-11-10: 170 mg via INTRAVENOUS
  Administered 2015-11-10: 50 mg via INTRAVENOUS

## 2015-11-10 MED ORDER — NITROGLYCERIN 2 % TD OINT
1.0000 [in_us] | TOPICAL_OINTMENT | Freq: Four times a day (QID) | TRANSDERMAL | Status: DC
  Administered 2015-11-10: 1 [in_us] via TOPICAL

## 2015-11-10 MED ORDER — ASPIRIN 81 MG PO CHEW
81.0000 mg | CHEWABLE_TABLET | Freq: Every day | ORAL | Status: DC
  Administered 2015-11-10 – 2015-11-12 (×3): 81 mg via ORAL
  Filled 2015-11-10 (×3): qty 1

## 2015-11-10 MED ORDER — HEPARIN BOLUS VIA INFUSION
4000.0000 [IU] | Freq: Once | INTRAVENOUS | Status: DC
  Filled 2015-11-10: qty 4000

## 2015-11-10 MED ORDER — PIPERACILLIN-TAZOBACTAM 3.375 G IVPB
3.3750 g | Freq: Three times a day (TID) | INTRAVENOUS | Status: DC
  Administered 2015-11-10 – 2015-11-12 (×5): 3.375 g via INTRAVENOUS
  Filled 2015-11-10 (×5): qty 50

## 2015-11-10 MED ORDER — PNEUMOCOCCAL VAC POLYVALENT 25 MCG/0.5ML IJ INJ: 0.5000 mL | INJECTION | INTRAMUSCULAR | Status: DC

## 2015-11-10 MED ORDER — SODIUM CHLORIDE 0.9 % IV SOLN
INTRAVENOUS | Status: DC
  Administered 2015-11-10: 17:00:00 via INTRAVENOUS

## 2015-11-10 MED ORDER — HEPARIN SODIUM (PORCINE) 5000 UNIT/ML IJ SOLN
INTRAMUSCULAR | Status: AC
  Filled 2015-11-10: qty 1

## 2015-11-10 MED ORDER — ONDANSETRON HCL 4 MG/2ML IJ SOLN
INTRAMUSCULAR | Status: DC | PRN
  Administered 2015-11-10: 4 mg via INTRAVENOUS

## 2015-11-10 MED ORDER — LIDOCAINE HCL (CARDIAC) 20 MG/ML IV SOLN
INTRAVENOUS | Status: DC | PRN
  Administered 2015-11-10: 60 mg via INTRAVENOUS

## 2015-11-10 MED ORDER — OXYCODONE-ACETAMINOPHEN 5-325 MG PO TABS
1.0000 | ORAL_TABLET | ORAL | Status: DC | PRN
  Administered 2015-11-11 – 2015-11-12 (×5): 2 via ORAL
  Filled 2015-11-10 (×5): qty 2

## 2015-11-10 MED ORDER — IOPAMIDOL (ISOVUE-300) INJECTION 61%
100.0000 mL | Freq: Once | INTRAVENOUS | Status: AC | PRN
  Administered 2015-11-10: 100 mL via INTRAVENOUS
  Filled 2015-11-10: qty 100

## 2015-11-10 MED ORDER — DIPHENHYDRAMINE HCL 50 MG/ML IJ SOLN
12.5000 mg | Freq: Four times a day (QID) | INTRAMUSCULAR | Status: DC | PRN
  Filled 2015-11-10: qty 1

## 2015-11-10 MED ORDER — LABETALOL HCL 5 MG/ML IV SOLN
INTRAVENOUS | Status: AC
  Administered 2015-11-10: 5 mg via INTRAVENOUS
  Filled 2015-11-10: qty 4

## 2015-11-10 MED ORDER — LABETALOL HCL 5 MG/ML IV SOLN
5.0000 mg | INTRAVENOUS | Status: DC | PRN
Start: 2015-11-10 — End: 2015-11-10
  Administered 2015-11-10 (×4): 5 mg via INTRAVENOUS

## 2015-11-10 MED ORDER — ACETAMINOPHEN 325 MG PO TABS: 650.0000 mg | ORAL_TABLET | ORAL | Status: DC | PRN

## 2015-11-10 MED ORDER — ASPIRIN 81 MG PO CHEW
324.0000 mg | CHEWABLE_TABLET | Freq: Once | ORAL | Status: AC
  Administered 2015-11-10: 324 mg via ORAL

## 2015-11-10 MED ORDER — HEPARIN BOLUS VIA INFUSION
1250.0000 [IU] | Freq: Once | INTRAVENOUS | Status: AC
  Administered 2015-11-11: 1250 [IU] via INTRAVENOUS
  Filled 2015-11-10: qty 1250

## 2015-11-10 MED ORDER — INSULIN ASPART 100 UNIT/ML ~~LOC~~ SOLN
0.0000 [IU] | Freq: Every day | SUBCUTANEOUS | Status: DC
  Administered 2015-11-10: 3 [IU] via SUBCUTANEOUS
  Filled 2015-11-10: qty 3

## 2015-11-10 MED ORDER — OXYCODONE-ACETAMINOPHEN 5-325 MG PO TABS
2.0000 | ORAL_TABLET | Freq: Once | ORAL | Status: AC
  Administered 2015-11-10: 2 via ORAL
  Filled 2015-11-10: qty 2

## 2015-11-10 MED ORDER — DIPHENHYDRAMINE HCL 12.5 MG/5ML PO ELIX
12.5000 mg | ORAL_SOLUTION | Freq: Four times a day (QID) | ORAL | Status: DC | PRN
  Filled 2015-11-10: qty 5

## 2015-11-10 MED ORDER — SODIUM CHLORIDE FLUSH 0.9 % IV SOLN
INTRAVENOUS | Status: AC
  Filled 2015-11-10: qty 3

## 2015-11-10 MED ORDER — MORPHINE SULFATE (PF) 2 MG/ML IV SOLN
2.0000 mg | INTRAVENOUS | Status: DC | PRN
  Administered 2015-11-12 (×2): 4 mg via INTRAVENOUS
  Filled 2015-11-10 (×2): qty 2

## 2015-11-10 MED ORDER — MIDAZOLAM HCL 2 MG/2ML IJ SOLN
INTRAMUSCULAR | Status: DC | PRN
  Administered 2015-11-10: 2 mg via INTRAVENOUS

## 2015-11-10 MED ORDER — CARVEDILOL 3.125 MG PO TABS
3.1250 mg | ORAL_TABLET | Freq: Two times a day (BID) | ORAL | Status: DC
  Administered 2015-11-11 – 2015-11-12 (×3): 3.125 mg via ORAL
  Filled 2015-11-10 (×3): qty 1

## 2015-11-10 MED ORDER — FENTANYL CITRATE (PF) 100 MCG/2ML IJ SOLN
INTRAMUSCULAR | Status: DC | PRN
  Administered 2015-11-10 (×4): 50 ug via INTRAVENOUS

## 2015-11-10 MED ORDER — LIDOCAINE-EPINEPHRINE (PF) 1 %-1:200000 IJ SOLN: 10.0000 mL | Freq: Once | INTRAMUSCULAR | Status: DC

## 2015-11-10 MED ORDER — INFLUENZA VAC SPLIT QUAD 0.5 ML IM SUSY: 0.5000 mL | PREFILLED_SYRINGE | INTRAMUSCULAR | Status: DC

## 2015-11-10 MED ORDER — FENTANYL CITRATE (PF) 100 MCG/2ML IJ SOLN: 25.0000 ug | INTRAMUSCULAR | Status: DC | PRN

## 2015-11-10 MED ORDER — ONDANSETRON HCL 4 MG/2ML IJ SOLN
4.0000 mg | INTRAMUSCULAR | Status: DC | PRN
  Administered 2015-11-11 – 2015-11-12 (×2): 4 mg via INTRAVENOUS
  Filled 2015-11-10 (×2): qty 2

## 2015-11-10 MED ORDER — HEPARIN SODIUM (PORCINE) 5000 UNIT/ML IJ SOLN: 5000.0000 [IU] | Freq: Three times a day (TID) | INTRAMUSCULAR | Status: DC

## 2015-11-10 MED ORDER — HYDRALAZINE HCL 20 MG/ML IJ SOLN
10.0000 mg | Freq: Three times a day (TID) | INTRAMUSCULAR | Status: DC | PRN
  Administered 2015-11-12 (×2): 10 mg via INTRAVENOUS
  Filled 2015-11-10: qty 1
  Filled 2015-11-10: qty 0.5
  Filled 2015-11-10: qty 1

## 2015-11-10 MED ORDER — DOCUSATE SODIUM 100 MG PO CAPS
100.0000 mg | ORAL_CAPSULE | Freq: Two times a day (BID) | ORAL | Status: DC
  Administered 2015-11-10 – 2015-11-12 (×4): 100 mg via ORAL
  Filled 2015-11-10 (×4): qty 1

## 2015-11-10 MED ORDER — HEPARIN (PORCINE) IN NACL 100-0.45 UNIT/ML-% IJ SOLN
1550.0000 [IU]/h | INTRAMUSCULAR | Status: DC
  Administered 2015-11-10: 1400 [IU]/h via INTRAVENOUS
  Administered 2015-11-11 (×2): 1550 [IU]/h via INTRAVENOUS
  Filled 2015-11-10 (×6): qty 250

## 2015-11-10 MED ORDER — CLINDAMYCIN PHOSPHATE 600 MG/50ML IV SOLN
600.0000 mg | Freq: Three times a day (TID) | INTRAVENOUS | Status: DC
  Administered 2015-11-10 – 2015-11-12 (×6): 600 mg via INTRAVENOUS
  Filled 2015-11-10 (×10): qty 50

## 2015-11-10 MED ORDER — LIDOCAINE HCL 1 % IJ SOLN
INTRAMUSCULAR | Status: DC | PRN
  Administered 2015-11-10: 5 mL

## 2015-11-10 MED ORDER — ONDANSETRON HCL 4 MG/2ML IJ SOLN: 4.0000 mg | Freq: Once | INTRAMUSCULAR | Status: DC | PRN

## 2015-11-10 SURGICAL SUPPLY — 37 items
CANISTER SUCT 1200ML W/VALVE (MISCELLANEOUS) ×3 IMPLANT
CHLORAPREP W/TINT 26ML (MISCELLANEOUS) ×3 IMPLANT
DRAIN PENROSE 1/4X12 LTX (DRAIN) ×6 IMPLANT
DRAPE LAPAROTOMY 77X122 PED (DRAPES) ×3 IMPLANT
ELECT REM PT RETURN 9FT ADLT (ELECTROSURGICAL) ×3
ELECTRODE REM PT RTRN 9FT ADLT (ELECTROSURGICAL) ×1 IMPLANT
GAUZE FLUFF 18X24 1PLY STRL (GAUZE/BANDAGES/DRESSINGS) IMPLANT
GAUZE SPONGE 4X4 12PLY STRL (GAUZE/BANDAGES/DRESSINGS) ×3 IMPLANT
GAUZE STRETCH 2X75IN STRL (MISCELLANEOUS) ×3 IMPLANT
GLOVE BIO SURGEON STRL SZ 6.5 (GLOVE) ×2 IMPLANT
GLOVE BIO SURGEON STRL SZ7 (GLOVE) ×3 IMPLANT
GLOVE BIO SURGEONS STRL SZ 6.5 (GLOVE) ×1
GOWN STRL REUS W/ TWL LRG LVL3 (GOWN DISPOSABLE) ×2 IMPLANT
GOWN STRL REUS W/TWL LRG LVL3 (GOWN DISPOSABLE) ×4
KIT RM TURNOVER STRD PROC AR (KITS) ×3 IMPLANT
LABEL OR SOLS (LABEL) IMPLANT
LIQUID BAND (GAUZE/BANDAGES/DRESSINGS) ×3 IMPLANT
NEEDLE HYPO 25X1 1.5 SAFETY (NEEDLE) ×3 IMPLANT
NS IRRIG 500ML POUR BTL (IV SOLUTION) ×3 IMPLANT
PACK BASIN MINOR ARMC (MISCELLANEOUS) ×3 IMPLANT
PAD ABD DERMACEA PRESS 5X9 (GAUZE/BANDAGES/DRESSINGS) ×3 IMPLANT
PREP PVP WINGED SPONGE (MISCELLANEOUS) ×3 IMPLANT
SOL PREP PVP 2OZ (MISCELLANEOUS)
SOLUTION PREP PVP 2OZ (MISCELLANEOUS) IMPLANT
SUPPORETR ATHLETIC LG (MISCELLANEOUS) IMPLANT
SUPPORTER ATHLETIC LG (MISCELLANEOUS)
SUT ETHILON 3-0 FS-10 30 BLK (SUTURE) ×3
SUT SILK 0 (SUTURE) ×2
SUT SILK 0 30XBRD TIE 6 (SUTURE) ×1 IMPLANT
SUT VIC AB 3-0 SH 27 (SUTURE)
SUT VIC AB 3-0 SH 27X BRD (SUTURE) IMPLANT
SUT VIC AB 4-0 SH 27 (SUTURE)
SUT VIC AB 4-0 SH 27XANBCTRL (SUTURE) IMPLANT
SUTURE EHLN 3-0 FS-10 30 BLK (SUTURE) ×1 IMPLANT
SWAB DUAL CULTURE TRANS RED ST (MISCELLANEOUS) ×3 IMPLANT
SYR BULB IRRIG 60ML STRL (SYRINGE) ×3 IMPLANT
SYRINGE 10CC LL (SYRINGE) ×3 IMPLANT

## 2015-11-10 NOTE — ED Notes (Signed)
See triage note  States he developed a possible abscess area at groin and to lower back last Thursday

## 2015-11-10 NOTE — Consult Note (Addendum)
Urology Consult  I have been asked to see the patient by Dr. Alphonzo Lemmings, for evaluation and management of scrotal abscess .  Chief Complaint: scrotal abcsess  History of Present Illness: Joshua Tate is a 40 y.o. year old with a history of recurrent perirectal and scrotal abscesses who presents to the emergency room today with drainage from his scrotum and perineum. He's had involving scrotal and perirectal tenderness for the last 3 days. This morning, we'll having a bowel movement, his abscess near his rectum began to drain spontaneously.  CT scan shows inflammatory changes involving the left perineum and a fluid collection within the right hemiscrotum although he endorses pain only left hemiscrotum.    He denies any fevers or chills. No nausea or vomiting.  He has been voiding well but notes that he does occasionally have some dysuria.  He last underwent incision and drainage with Dr. Tonita Cong on 09/26/15 but left AMA after refusing to be admitted for IV antibiotics due to childcare issues. His last scrotal I&D was approximately 2 years ago.  He is a "borderline" diabetic.  He reports that he had an A1c of 12 in the past but improved to 5ish.     Past Medical History:  Diagnosis Date  . Abscess    to rectum  . Hypertension     Past Surgical History:  Procedure Laterality Date  . EYE SURGERY    . INCISION AND DRAINAGE ABSCESS N/A 09/26/2015   Procedure: INCISION AND DRAINAGE perirectal  ABSCESS;  Surgeon: Leafy Ro, MD;  Location: [facility] ORS;  Service: General;  Laterality: N/A;    Home Medications:  No outpatient prescriptions have been marked as taking for the 11/10/15 encounter [facility] Encounter).    Allergies: No Known Allergies  No family history on file.  Social History:  reports that he has been smoking Cigars.  He has never used smokeless tobacco. He reports that he drinks alcohol. He reports that he uses drugs, including Marijuana, about 7 times per  week.  ROS: A complete review of systems was performed.  All systems are negative except for pertinent findings as noted.  Physical Exam:  Vital signs in last 24 hours: Temp:  [97.4 F (36.3 C)] 97.4 F (36.3 C) (10/09 0733) Pulse Rate:  [58] 58 (10/09 0733) Resp:  [18] 18 (10/09 0733) BP: (155)/(110) 155/110 (10/09 0733) SpO2:  [100 %] 100 % (10/09 0733) Weight:  [185 lb (83.9 kg)] 185 lb (83.9 kg) (10/09 0731) Constitutional:  Alert and oriented, No acute distress.  Accompanied by friend and 80-year-old son. Somewhat malodorous. HEENT: [place] AT, moist mucus membranes.  Trachea midline, no masses Cardiovascular: Regular rate and rhythm, no clubbing, cyanosis, or edema. Respiratory: Normal respiratory effort, lungs clear bilaterally GI: Abdomen is soft, nontender, nondistended, no abdominal masses GU: Indurated left hemiscrotum with scrotal wall thickening, at least 2 small punctate openings within the wall of the left hemiscrotum draining cloudy bloody fluid, foul smelling. Extensive induration and left perineum with drainage in the perirectal area. Skin: Evidence of scarring within the inguinal areas bilaterally consistent with hidradenitis. Lymph: Palpable bilateral inguinal adenopathy. Neurologic: Grossly intact, no focal deficits, moving all 4 extremities Psychiatric: Normal mood and affect   Laboratory Data:   Recent Labs  11/10/15 0849  WBC 20.3*  HGB 13.4  HCT 41.0    Recent Labs  11/10/15 0849  NA 143  K 4.0  CL 109  CO2 27  GLUCOSE 121*  BUN 10  CREATININE 0.88  CALCIUM 8.9   No results for input(s): LABPT, INR in the last 72 hours. No results for input(s): LABURIN in the last 72 hours. Results for orders placed or performed during the hospital encounter of 09/26/15  Anaerobic culture     Status: None   Collection Time: 09/26/15  7:45 PM  Result Value Ref Range Status   Specimen Description ABSCESS PERIRECTAL  Final   Special Requests NONE  Final    Culture   Final    FEW PEPTOSTREPTOCOCCUS SPECIES FEW FUSOBACTERIUM NECROPHORUM BETA LACTAMASE NEGATIVE Performed at [facility]    Report Status 10/02/2015 FINAL  Final  Aerobic Culture (superficial specimen)     Status: None   Collection Time: 09/26/15  7:45 PM  Result Value Ref Range Status   Specimen Description ABSCESS PERIRECTAL  Final   Special Requests NONE  Final   Gram Stain   Final    MODERATE WBC PRESENT,BOTH PMN AND MONONUCLEAR FEW GRAM POSITIVE COCCI IN PAIRS    Culture   Final    NO GROWTH 2 DAYS Performed at [facility]    Report Status 09/29/2015 FINAL  Final     Radiologic Imaging: Ct Pelvis W Contrast  Result Date: 11/10/2015 CLINICAL DATA:  Scrotal and perineal abscess. EXAM: CT PELVIS WITH CONTRAST TECHNIQUE: Multidetector CT imaging of the pelvis was performed using the standard protocol following the bolus administration of intravenous contrast. CONTRAST:  ISOVUE-300 IOPAMIDOL (ISOVUE-300) INJECTION 61% COMPARISON:  CT scan of September 26, 2015. FINDINGS: Urinary Tract: Urinary bladder is decompressed. Visualized portions of lower poles of both kidneys appear normal. No definite ureteral dilatation is noted. Bowel:  There is no evidence of bowel obstruction. Vascular/Lymphatic: Atherosclerosis of abdominal aorta is noted. 3 cm infrarenal abdominal aortic aneurysm is noted. Bilateral inguinal adenopathy is noted with 3.2 x 1.5 cm lymph node seen in right inguinal region which is enlarged compared to prior exam. Reproductive:  Prostate gland appears normal. Other: Large amount of inflammatory changes are noted in the left perineal region, although no large defined fluid collection is seen in this area. The abscess noted on prior exam appears to have significantly improved. However, inflammatory changes are seen involving the scrotum, with 2.3 cm rounded low density seen in the right scrotum concerning for possible abscess. Musculoskeletal: No  significant osseous abnormality is noted. IMPRESSION: 3 cm infrarenal abdominal aortic aneurysm. Recommend followup by ultrasound in 3 years. This recommendation follows ACR consensus guidelines: White Paper of the ACR Incidental Findings Committee II on Vascular Findings. J Am Coll Radiol 2013; 10:789-794. Bilateral inguinal adenopathy is noted, right greater than left, which most likely is inflammatory in origin. Extensive inflammatory changes are seen in the left perineal region, although the abscess noted on prior exam appears to be significantly improved. This is consistent with extensive cellulitis. There is noted an increased amount of inflammatory changes involving the scrotum compared to prior exam, with 2.3 cm low density seen in right scrotum concerning for possible abscess. This is new since prior exam. Electronically Signed   By: Lupita Raider, M.D.   On: 11/10/2015 09:59   CT scan personally reviewed today  Impression/Assessment:  40 year old male with recurrent scrotal, perineal, perirectal abscesses who presents with drainage from his left hemiscrotum consistent with recurrent abscess as well as drainage from the perirectal lesion. It appears that these 2 areas may be communicating. Given the somewhat extensive nature of the abscess, I have recommended proceeding to the operating  room for I&D, scrotal expiration under anesthesia. Will contact my general surgery colleagues to have them evaluate the patient at the time of the procedure.  Risk and benefits were discussed.  We'll plan on admitting the patient postoperatively for IV antibiotics.  Plan:  -Consent obtained -Nothing by mouth -To OR for scrotal I and D -IV abx -intraop culture -UA/UCx  11/10/2015, 11:06 AM  Vanna ScotlandAshley Katara Griner,  MD

## 2015-11-10 NOTE — Anesthesia Preprocedure Evaluation (Signed)
Anesthesia Evaluation  Patient identified by MRN, date of birth, ID band Patient awake    Reviewed: Allergy & Precautions, H&P , NPO status , Patient's Chart, lab work & pertinent test results, reviewed documented beta blocker date and time   Airway Mallampati: II  TM Distance: >3 FB Neck ROM: full    Dental  (+) Teeth Intact   Pulmonary neg pulmonary ROS, Current Smoker,    Pulmonary exam normal        Cardiovascular Exercise Tolerance: Good hypertension, negative cardio ROS Normal cardiovascular exam Rate:Normal     Neuro/Psych negative neurological ROS  negative psych ROS   GI/Hepatic negative GI ROS, Neg liver ROS,   Endo/Other  negative endocrine ROS  Renal/GU negative Renal ROS  negative genitourinary   Musculoskeletal   Abdominal   Peds  Hematology negative hematology ROS (+)   Anesthesia Other Findings   Reproductive/Obstetrics negative OB ROS                             Anesthesia Physical Anesthesia Plan  ASA: II and emergent  Anesthesia Plan: General LMA   Post-op Pain Management:    Induction:   Airway Management Planned:   Additional Equipment:   Intra-op Plan:   Post-operative Plan:   Informed Consent: I have reviewed the patients History and Physical, chart, labs and discussed the procedure including the risks, benefits and alternatives for the proposed anesthesia with the patient or authorized representative who has indicated his/her understanding and acceptance.     Plan Discussed with: CRNA  Anesthesia Plan Comments:         Anesthesia Quick Evaluation

## 2015-11-10 NOTE — Op Note (Signed)
  Procedure Date:  11/10/2015  Pre-operative Diagnosis:  Perineal and left scrotal abscess  Post-operative Diagnosis: Perineal and left scrotal abscess  Procedure:  Incision and drainage of perineal abscess  Surgeon:  Howie IllJose Luis Twyla Dais, MD  Anesthesia:  General endotracheal  Estimated Blood Loss:  5 ml  Specimens:  None  Complications:  None  Indications for Procedure:  This is a 40 y.o. male who presented to the ED with drainage from a scrotal abscess.  On exam, he was noted to also have a perineal abscess.  Urology consulted General Surgery intra-op for evaluation of the patient's perineal region and management.  Description of Procedure: The patient had already been placed in lithotomy position and was draped and prepped.  He was noted to have purulent drainage from a previous perineal wound and an area of induration inferior and posterior to it.  An incision was made in both regions revealing a small amount of purulent fluid.  This cavity tracked up toward the previous wound.  A Kelly forcep was used to go through this track and come out through the previous wound. The cavity was irrigated and a 1/4 inch penrose drain was placed through it and secured with three 0 silk ties.  The wound was irrigated and bleeding controlled with cautery.  Please refer to Dr. Delana MeyerBrandon's dictation for details on the rest of the procedure.   Howie IllJose Luis Tarisa Paola, MD

## 2015-11-10 NOTE — ED Notes (Signed)
Apple juice provided to family members.

## 2015-11-10 NOTE — ED Triage Notes (Signed)
Several abscesses to groin area for a while.

## 2015-11-10 NOTE — ED Notes (Signed)
Dr Apolinar JunesBrandon in with pt   Family remains at bedside

## 2015-11-10 NOTE — Anesthesia Procedure Notes (Signed)
Procedure Name: LMA Insertion Date/Time: 11/10/2015 12:42 PM Performed by: Michaele OfferSAVAGE, Hortence Charter Pre-anesthesia Checklist: Patient identified, Emergency Drugs available, Suction available, Patient being monitored and Timeout performed Patient Re-evaluated:Patient Re-evaluated prior to inductionOxygen Delivery Method: Circle system utilized Preoxygenation: Pre-oxygenation with 100% oxygen Intubation Type: IV induction Ventilation: Mask ventilation without difficulty LMA: LMA inserted LMA Size: 4.0 Number of attempts: 1 Placement Confirmation: positive ETCO2 and breath sounds checked- equal and bilateral Tube secured with: Tape Dental Injury: Teeth and Oropharynx as per pre-operative assessment

## 2015-11-10 NOTE — ED Provider Notes (Signed)
[facility] Emergency Department Provider Note  ____________________________________________  Time seen: Approximately 8:22 AM  I have reviewed the triage vital signs and the nursing notes.   HISTORY  Chief Complaint Abscess    HPI Joshua Tate is a 40 y.o. male, NAD, presents to the emergency department with five-day history of groin abscess.  Was seen in this emergency department in late August 2017 for similar abscesses in which he was admitted to the general surgery department. Patient states he had incision and drainage completed but he left AGAINST MEDICAL ADVICE soon after as he had no child care. States he was not on any antibiotics since that time. Patient reports that the first abscess to appear is "below my rectum" and began draining "blood with pus" this morning after a bowel movement. He reports a second abscess "behind my scrotum" that began 3 days ago and is the most painful.  Patient reports pain with walking and movements of the legs that put pressure on the swollen areas.  He states that he has been unable to sleep due the pain.  Patient states he has been taking 3 Advil at a time without relief of pain.  Patient also reports trying warm compresses and warm showers without relief of pain.  Patient reports a long history of multiple abscesses.   He denies fevers, chills, body aches. Has had no dysuria, hematuria. Denies paresthesias nor loss of bowel or bladder control. Has had no chest pain, shortness of breath   Past Medical History:  Diagnosis Date  . Abscess    to rectum  . Diabetes mellitus (HCC)   . Hypertension     Patient Active Problem List   Diagnosis Date Noted  . Scrotal abscess 11/10/2015  . Abnormal EKG 11/10/2015  . Essential hypertension 11/10/2015  . Diabetes mellitus (HCC) 11/10/2015  . Cardiomyopathy (HCC) 11/10/2015  . 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 Perirectal abscess 09/26/2015    Past Surgical History:   Procedure Laterality Date  . EYE SURGERY    . INCISION AND DRAINAGE ABSCESS N/A 09/26/2015   Procedure: INCISION AND DRAINAGE perirectal  ABSCESS;  Surgeon: Leafy Ro, MD;  Location: [facility] ORS;  Service: General;  Laterality: N/A;    Prior to Admission medications   Medication Sig Start Date End Date Taking? Authorizing Provider  naproxen sodium (ANAPROX) 220 MG tablet Take 220 mg by mouth 2 (two) times daily as needed.    Historical Provider, MD    Allergies Review of patient's allergies indicates no known allergies.  Family History  Problem Relation Age of Onset  . CAD Mother     a. MI age 23  . Hypertension Mother   . Diabetes Mellitus II Mother     Social History Social History  Substance Use Topics  . Smoking status: Current Every Day Smoker    Types: Cigars  . Smokeless tobacco: Never Used  . Alcohol use Yes     Comment: socially     Review of Systems  Constitutional: No fever/chills Cardiovascular: No chest pain. Respiratory: No shortness of breath.  Gastrointestinal: No abdominal pain.  No nausea, vomiting.  No diarrhea.  Musculoskeletal: Negative for general myalgias.  Skin: Draining skin sore about the perineum. Painful, swollen, red skin sore about the scrotum. Neurological: Negative for numbness, weakness, tingling. No saddle paresthesias nor loss of bowel or bladder control. 10-point ROS otherwise negative.  ____________________________________________   PHYSICAL EXAM:  VITAL SIGNS: ED Triage Vitals  Enc  Vitals Group     BP 11/10/15 0733 (!) 155/110     Pulse Rate 11/10/15 0733 (!) 58     Resp 11/10/15 0733 18     Temp 11/10/15 0733 97.4 F (36.3 C)     Temp Source 11/10/15 0733 Oral     SpO2 11/10/15 0733 100 %     Weight 11/10/15 0731 185 lb (83.9 kg)     Height 11/10/15 0734 6' (1.829 m)     Head Circumference --      Peak Flow --      Pain Score 11/10/15 0732 10     Pain Loc --      Pain Edu? --      Excl. in GC? --       Constitutional: Alert and oriented. Well appearing and in no acute distress. Eyes: Conjunctivae are normal. Head: Atraumatic. Hematological/Lymphatic/Immunilogical: No cervical lymphadenopathy. Cardiovascular: Good peripheral circulation. Respiratory: Normal respiratory effort without tachypnea or retractions.  Genitourinary: Significant swelling with fluctuance skin sore noted about the left scrotal region with multiple large, open pores consistent with hidradenitis. Open skin sore noted about her region with purulent drainage. Musculoskeletal: Full range of motion of bilateral upper and lower extremities without pain or difficulty. Neurologic:  Normal speech and language. No gross focal neurologic deficits are appreciated.  Skin:  Skin is warm, dry. No rash noted. Psychiatric: Mood and affect are normal. Speech and behavior are normal. Patient exhibits appropriate insight and judgement.   ____________________________________________   LABS (all labs ordered are listed, but only abnormal results are displayed)  Labs Reviewed  BASIC METABOLIC PANEL - Abnormal; Notable for the following:       Result Value   Glucose, Bld 121 (*)    All other components within normal limits  CBC WITH DIFFERENTIAL/PLATELET - Abnormal; Notable for the following:    WBC 20.3 (*)    RDW 15.4 (*)    Neutro Abs 16.1 (*)    Monocytes Absolute 1.5 (*)    All other components within normal limits  TROPONIN I - Abnormal; Notable for the following:    Troponin I 0.05 (*)    All other components within normal limits  GLUCOSE, CAPILLARY - Abnormal; Notable for the following:    Glucose-Capillary 114 (*)    All other components within normal limits  AEROBIC/ANAEROBIC CULTURE (SURGICAL/DEEP WOUND)  URINE CULTURE  TROPONIN I  TROPONIN I  URINALYSIS COMPLETEWITH MICROSCOPIC ([facility] ONLY)  HEMOGLOBIN A1C  CBC  COMPREHENSIVE METABOLIC PANEL  LIPID PANEL  HEMOGLOBIN A1C    ____________________________________________  EKG  None ____________________________________________  RADIOLOGY I, Ernestene KielJami L Hagler, personally viewed and evaluated these images (plain radiographs) as part of my medical decision making, as well as reviewing the written report by the radiologist.  Ct Pelvis W Contrast  Result Date: 11/10/2015 CLINICAL DATA:  Scrotal and perineal abscess. EXAM: CT PELVIS WITH CONTRAST TECHNIQUE: Multidetector CT imaging of the pelvis was performed using the standard protocol following the bolus administration of intravenous contrast. CONTRAST:  100mL ISOVUE-300 IOPAMIDOL (ISOVUE-300) INJECTION 61% COMPARISON:  CT scan of September 26, 2015. FINDINGS: Urinary Tract: Urinary bladder is decompressed. Visualized portions of lower poles of both kidneys appear normal. No definite ureteral dilatation is noted. Bowel:  There is no evidence of bowel obstruction. Vascular/Lymphatic: Atherosclerosis of abdominal aorta is noted. 3 cm infrarenal abdominal aortic aneurysm is noted. Bilateral inguinal adenopathy is noted with 3.2 x 1.5 cm lymph node seen in right inguinal region which is enlarged  compared to prior exam. Reproductive:  Prostate gland appears normal. Other: Large amount of inflammatory changes are noted in the left perineal region, although no large defined fluid collection is seen in this area. The abscess noted on prior exam appears to have significantly improved. However, inflammatory changes are seen involving the scrotum, with 2.3 cm rounded low density seen in the right scrotum concerning for possible abscess. Musculoskeletal: No significant osseous abnormality is noted. IMPRESSION: 3 cm infrarenal abdominal aortic aneurysm. Recommend followup by ultrasound in 3 years. This recommendation follows ACR consensus guidelines: White Paper of the ACR Incidental Findings Committee II on Vascular Findings. J Am Coll Radiol 2013; 10:789-794. Bilateral inguinal adenopathy is  noted, right greater than left, which most likely is inflammatory in origin. Extensive inflammatory changes are seen in the left perineal region, although the abscess noted on prior exam appears to be significantly improved. This is consistent with extensive cellulitis. There is noted an increased amount of inflammatory changes involving the scrotum compared to prior exam, with 2.3 cm low density seen in right scrotum concerning for possible abscess. This is new since prior exam. Electronically Signed   By: Lupita Raider, M.D.   On: 11/10/2015 09:59    ____________________________________________    PROCEDURES  Procedure(s) performed: None   Procedures   Medications  0.9 %  sodium chloride infusion (not administered)  clindamycin (CLEOCIN) IVPB 600 mg (not administered)  oxyCODONE-acetaminophen (PERCOCET/ROXICET) 5-325 MG per tablet 1-2 tablet (not administered)  acetaminophen (TYLENOL) tablet 650 mg (not administered)  morphine 2 MG/ML injection 2-4 mg (not administered)  diphenhydrAMINE (BENADRYL) injection 12.5 mg (not administered)    Or  diphenhydrAMINE (BENADRYL) 12.5 MG/5ML elixir 12.5 mg (not administered)  docusate sodium (COLACE) capsule 100 mg (not administered)  ondansetron (ZOFRAN) injection 4 mg (not administered)  piperacillin-tazobactam (ZOSYN) IVPB 3.375 g (not administered)  hydrALAZINE (APRESOLINE) injection 10 mg (not administered)  heparin ADULT infusion 100 units/mL (25000 units/216mL sodium chloride 0.45%) (1,400 Units/hr Intravenous New Bag/Given 11/10/15 1703)  sodium chloride flush 0.9 % injection (not administered)  insulin aspart (novoLOG) injection 0-9 Units (0 Units Subcutaneous Not Given 11/10/15 1709)  oxyCODONE-acetaminophen (PERCOCET/ROXICET) 5-325 MG per tablet 2 tablet (2 tablets Oral Given 11/10/15 0850)  iopamidol (ISOVUE-300) 61 % injection 100 mL (100 mLs Intravenous Contrast Given 11/10/15 0935)  clindamycin (CLEOCIN) IVPB 600 mg (0 mg  Intravenous Stopped 11/10/15 1220)  aspirin chewable tablet 324 mg (324 mg Oral Given 11/10/15 1425)     ____________________________________________   INITIAL IMPRESSION / ASSESSMENT AND PLAN / ED COURSE  Pertinent labs & imaging results that were available during my care of the patient were reviewed by me and considered in my medical decision making (see chart for details).  Clinical Course  Comment By Time  I spoke with Dr. Aleen Campi, general surgery on call, to discuss if patient would need a repeat CT Pelvis as one was completed on 09/26/15. He suggests to repeat the scan to compare to the August scan and call him with results.  Hope Pigeon, PA-C 10/09 1610  I spoke with Dr. Aleen Campi in regards to the patient's CT scan. He states that since the abscess is located more in the right scrotal region versus perineal that it would be best to consult with urology. If urology is unable to consult with the patient he may be contacted again and could see the patient in between his current surgical cases. Hope Pigeon, PA-C 10/09 1003  I spoke with Dr. Mena Goes  with urology. He will come to the emergency department to I&D scrotal abscess within the next 2 hours. He has requested lidocaine 1% with epinephrine to use for the incision and drainage. Hope Pigeon, PA-C 10/09 1040  Dr. Apolinar Junes with urology assessed the patient and deemed that he will need to be admitted for surgical incision and drainage.  Hope Pigeon, PA-C 10/09 1215    Patient's diagnosis is consistent with Abscess of scrotum. Patient was admitted to the urology department to have surgical incision and drainage of the scrotal abscess.    ____________________________________________  FINAL CLINICAL IMPRESSION(S) / ED DIAGNOSES  Final diagnoses:  Abscess of scrotum      NEW MEDICATIONS STARTED DURING THIS VISIT:  Current Discharge Medication List           Hope Pigeon, PA-C 11/10/15 1711    Jeanmarie Plant,  MD 11/11/15 [phone]

## 2015-11-10 NOTE — Consult Note (Signed)
Cardiology Consultation Note  Patient ID: Joshua Tate, MRN: [id number], DOB/AGE: [birth year] 41 y.o. Admit date: 11/10/2015   Date of Consult: 11/10/2015 Primary Physician: No PCP Per Patient Primary Cardiologist: New to [facility] Requesting Physician: Dr. Pernell Dupre, MD  Chief Complaint: Scrotal abscess Reason for Consult: Post-operative EKG changes  HPI: 40 y.o. male with h/o recurrent perirectal and scrotal abscesses, HTN, and DM who presented to [facility] ED today for evaluation of drainage from his scrotum and perineum x 3 days. During the post-operative course there was noted EKG changes along the anterolateral leads. He was asymptomatic. Cardiology to evaluate.   He apparently has seen a cardiologist in his teenage years 2/2 murmur. At that time he reports undergoing a treadmill stress test that he self reports as normal. This was 20+ years ago he reports. He has not seen a cardiologist since then. He smokes Black and Mild cigars daily as well as smokes marijuana. He rarely drinks etoh.   He presented to the ED with a 3 day history of scrotal drainage. CT showed inflammatory changes involving the left perineum and a fluid collection within the right hemiscrotum . He underwent successful scrotal I&D. While in the PACU there was noted ST elevation on telemetry. Stat 12-lead EKG showed nonspecific anterolateral st/t changes with inferior TWI. He was completely asymptomatic in the post-operative course, denying any chest pain, SOB, or palpitations. There was no nausea, vomiting, or diaphoresis.   Upon talking with Dr. Pernell Dupre, MD I was able to find out the patient had noted some increased fatigue for a couple of weeks prior to this hospital visit along with some mild chest heaviness, felt to be in the setting of known rheumatologic illness.   Stat echo showed an EF of <20% with diffuse hypokinesis and possible . Stat troponin came back at 0.05. Patient currently resting comfortably in the PACU without  complaints.     Past Medical History:  Diagnosis Date  . Abscess    to rectum  . Diabetes mellitus (HCC)   . Hypertension       Most Recent Cardiac Studies: none   Surgical History:  Past Surgical History:  Procedure Laterality Date  . EYE SURGERY    . INCISION AND DRAINAGE ABSCESS N/A 09/26/2015   Procedure: INCISION AND DRAINAGE perirectal  ABSCESS;  Surgeon: Leafy Ro, MD;  Location: [facility] ORS;  Service: General;  Laterality: N/A;     Home Meds: Prior to Admission medications   Medication Sig Start Date End Date Taking? Authorizing Provider  naproxen sodium (ANAPROX) 220 MG tablet Take 220 mg by mouth 2 (two) times daily as needed.    Historical Provider, MD    Inpatient Medications:  . [MAR Hold] lidocaine-EPINEPHrine  10 mL Infiltration Once  . nitroGLYCERIN  1 inch Topical Q6H      Allergies: No Known Allergies  Social History   Social History  . Marital status: Single    Spouse name: N/A  . Number of children: N/A  . Years of education: N/A   Occupational History  . Not on file.   Social History Main Topics  . Smoking status: Current Every Day Smoker    Types: Cigars  . Smokeless tobacco: Never Used  . Alcohol use Yes     Comment: socially  . Drug use:     Frequency: 7.0 times per week    Types: Marijuana  . Sexual activity: Not on file   Other Topics Concern  . Not on file  Social History Narrative  . No narrative on file     Family History  Problem Relation Age of Onset  . CAD Mother     a. MI age 41  . Hypertension Mother   . Diabetes Mellitus II Mother      Review of Systems: Review of Systems  Constitutional: Positive for fever and malaise/fatigue. Negative for chills, diaphoresis and weight loss.  HENT: Negative for congestion.   Eyes: Negative for discharge and redness.  Respiratory: Negative for cough, sputum production, shortness of breath and wheezing.   Cardiovascular: Negative for chest pain, palpitations, orthopnea,  claudication, leg swelling and PND.  Gastrointestinal: Negative for abdominal pain, heartburn, nausea and vomiting.  Musculoskeletal: Negative for falls and myalgias.  Skin: Negative for rash.  Neurological: Negative for dizziness, tingling, tremors, sensory change, speech change, focal weakness, loss of consciousness and weakness.  Endo/Heme/Allergies: Does not bruise/bleed easily.  Psychiatric/Behavioral: Positive for substance abuse. The patient is not nervous/anxious.   All other systems reviewed and are negative.   Labs: No results for input(s): CKTOTAL, CKMB, TROPONINI in the last 72 hours. Lab Results  Component Value Date   WBC 20.3 (H) 11/10/2015   HGB 13.4 11/10/2015   HCT 41.0 11/10/2015   MCV 88.8 11/10/2015   PLT 260 11/10/2015     Recent Labs Lab 11/10/15 0849  NA 143  K 4.0  CL 109  CO2 27  BUN 10  CREATININE 0.88  CALCIUM 8.9  GLUCOSE 121*   No results found for: CHOL, HDL, LDLCALC, TRIG No results found for: DDIMER  Radiology/Studies:  Ct Pelvis W Contrast  Result Date: 11/10/2015 CLINICAL DATA:  Scrotal and perineal abscess. EXAM: CT PELVIS WITH CONTRAST TECHNIQUE: Multidetector CT imaging of the pelvis was performed using the standard protocol following the bolus administration of intravenous contrast. CONTRAST:  100mL ISOVUE-300 IOPAMIDOL (ISOVUE-300) INJECTION 61% COMPARISON:  CT scan of September 26, 2015. FINDINGS: Urinary Tract: Urinary bladder is decompressed. Visualized portions of lower poles of both kidneys appear normal. No definite ureteral dilatation is noted. Bowel:  There is no evidence of bowel obstruction. Vascular/Lymphatic: Atherosclerosis of abdominal aorta is noted. 3 cm infrarenal abdominal aortic aneurysm is noted. Bilateral inguinal adenopathy is noted with 3.2 x 1.5 cm lymph node seen in right inguinal region which is enlarged compared to prior exam. Reproductive:  Prostate gland appears normal. Other: Large amount of inflammatory changes  are noted in the left perineal region, although no large defined fluid collection is seen in this area. The abscess noted on prior exam appears to have significantly improved. However, inflammatory changes are seen involving the scrotum, with 2.3 cm rounded low density seen in the right scrotum concerning for possible abscess. Musculoskeletal: No significant osseous abnormality is noted. IMPRESSION: 3 cm infrarenal abdominal aortic aneurysm. Recommend followup by ultrasound in 3 years. This recommendation follows ACR consensus guidelines: White Paper of the ACR Incidental Findings Committee II on Vascular Findings. J Am Coll Radiol 2013; 10:789-794. Bilateral inguinal adenopathy is noted, right greater than left, which most likely is inflammatory in origin. Extensive inflammatory changes are seen in the left perineal region, although the abscess noted on prior exam appears to be significantly improved. This is consistent with extensive cellulitis. There is noted an increased amount of inflammatory changes involving the scrotum compared to prior exam, with 2.3 cm low density seen in right scrotum concerning for possible abscess. This is new since prior exam. Electronically Signed   By: Lupita RaiderJames  Green Jr, M.D.  On: 11/10/2015 09:59    EKG: Interpreted by me showed: NSR, 84 bpm, nonspecific anterolateral st elevation possibly consistent with LVH/early repolarization,TWI leads I, II, aVL Telemetry: Interpreted by me showed: NSR, 70's bpm  Weights: Filed Weights   11/10/15 0731  Weight: 185 lb (83.9 kg)     Physical Exam: Blood pressure (!) 138/101, pulse 77, temperature 99 F (37.2 C), resp. rate 20, height 6' (1.829 m), weight 185 lb (83.9 kg), SpO2 99 %. Body mass index is 25.09 kg/m. General: Well developed, well nourished, in no acute distress. Head: Normocephalic, atraumatic, sclera non-icteric, no xanthomas, nares are without discharge.  Neck: Negative for carotid bruits. JVD not  elevated. Lungs: Clear bilaterally to auscultation without wheezes, rales, or rhonchi. Breathing is unlabored. Heart: RRR with S1 S2. II/VI systolic murmur, no rubs, or gallops appreciated. Abdomen: Soft, non-tender, non-distended with normoactive bowel sounds. No hepatomegaly. No rebound/guarding. No obvious abdominal masses. Msk:  Strength and tone appear normal for age. Extremities: No clubbing or cyanosis. No edema. Distal pedal pulses are 2+ and equal bilaterally. Neuro: Alert when spoken to and oriented X 3. No facial asymmetry. No focal deficit. Moves all extremities spontaneously. Psych:  Responds to questions appropriately with a normal affect.    Assessment and Plan:  Principal Problem:   Scrotal abscess Active Problems:   Abnormal EKG   Cardiomyopathy (HCC)   Essential hypertension   Diabetes mellitus (HCC)    1. Abnormal EKG/cardiomyopathy/elevated troponin: -Stat echo with preliminary read of 20-25% with AK of the mid-apicalanterior and anterolateral myocardium and possible LV thrombus -Stat troponin 0.05-->continue to cycle -Cardiomyopathy felt to be likely chronic -Discussed with surgery, ok for heparin gtt given the above -Currently without chest pain or SOB -Nitro paste in place, continue -Has received ASA 324 mg prior to cardiology arriving -Continue ASA -Labetalol as below -Case discussed with Dr. Kirke Corin MD over the phone -Dr. Jari Sportsman review of echo as above -Cannot rule out possible prior NICM that is just now being found on imaging  -Add fasting lipid panel and A1C for further risk stratification  -Case discussed with IM and patient to be admitted under IM service with Urology and Cardiology consulted   2. Accelerated HTN: -PRN labetalol  -Add prn hydralazine   3. Scrotal abscess: -Per Urology   4. DM: -Per IM  5. Possible LV thrombus: -Likely in the setting of his cardiomyopathy -Heparin gtt as above   Signed, Eula Listen, PA-C [facility] Pager: [phone] 11/10/2015, 3:15 PM

## 2015-11-10 NOTE — Progress Notes (Signed)
  Echocardiogram 2D Echocardiogram has been performed.  Joshua SavoyCasey N Merica Tate 11/10/2015, 3:29 PM

## 2015-11-10 NOTE — Transfer of Care (Signed)
Immediate Anesthesia Transfer of Care Note  Patient: Joshua Tate  Procedure(s) Performed: Procedure(s): INCISION AND DRAINAGE ABSCESS (N/A)  Patient Location: PACU  Anesthesia Type:General  Level of Consciousness: awake, alert , oriented and patient cooperative  Airway & Oxygen Therapy: Patient Spontanous Breathing and Patient connected to face mask oxygen  Post-op Assessment: Report given to RN, Post -op Vital signs reviewed and stable and Patient moving all extremities X 4  Post vital signs: Reviewed and stable  Last Vitals:  Vitals:   11/10/15 1129 11/10/15 1335  BP: (!) 150/98 116/84  Pulse: 88 93  Resp: 20 20  Temp:  37.2 C    Last Pain:  Vitals:   11/10/15 1219  TempSrc:   PainSc: 6          Complications: No apparent anesthesia complications

## 2015-11-10 NOTE — H&P (Signed)
[facility] [place] [facility]   PATIENT NAME: Joshua Tate    MR#:  [id number]  DATE OF BIRTH:  [birth year]  DATE OF ADMISSION:  11/10/2015  PRIMARY CARE PHYSICIAN: No PCP Per Patient   REQUESTING/REFERRING PHYSICIAN: Vanna ScotlandAshley Brandon  CHIEF COMPLAINT:   Chief Complaint  Patient presents with  . Abscess    HISTORY OF PRESENT ILLNESS: Joshua Tate  is a 40 y.o. male with a known history of  Recurrent scrotal abscesses, hypertension diabetes. Patient was seen in the ER due to recurrent scrotal abscess. He was taken to the operating room by urology with incision and drainage of the scrotal abscess with scrotal exploration. Patient tolerated the procedure without any complications. He was subsequently noticed to have EKG changes postoperatively therefore a stat cardiology consult was obtained. Cardiology saw the patient and they also did a echocardiogram of the heart. An echocardiogram of the heart showed EF less than 20% with a cardiac thrombus. Therefore they recommended admitting the patient for further evaluation and treatment. Patient currently still sedated from this procedure and anesthesia. Able to answer some questions. He currently denies any chest pain or shortness of breath.      PAST MEDICAL HISTORY:   Past Medical History:  Diagnosis Date  . Abscess    to rectum  . Diabetes mellitus (HCC)   . Hypertension     PAST SURGICAL HISTORY: Past Surgical History:  Procedure Laterality Date  . EYE SURGERY    . INCISION AND DRAINAGE ABSCESS N/A 09/26/2015   Procedure: INCISION AND DRAINAGE perirectal  ABSCESS;  Surgeon: Leafy Roiego F Pabon, MD;  Location: [facility] ORS;  Service: General;  Laterality: N/A;    SOCIAL HISTORY:  Social History  Substance Use Topics  . Smoking status: Current Every Day Smoker    Types: Cigars  . Smokeless tobacco: Never Used  . Alcohol use Yes     Comment: socially    FAMILY HISTORY:  Family History  Problem Relation Age  of Onset  . CAD Mother     a. MI age 41  . Hypertension Mother   . Diabetes Mellitus II Mother     DRUG ALLERGIES: No Known Allergies  REVIEW OF SYSTEMS:  Limited due to patient recovering from postop anesthesia  MEDICATIONS AT HOME:  Prior to Admission medications   Medication Sig Start Date End Date Taking? Authorizing Provider  naproxen sodium (ANAPROX) 220 MG tablet Take 220 mg by mouth 2 (two) times daily as needed.    Historical Provider, MD      PHYSICAL EXAMINATION:   VITAL SIGNS: Blood pressure 107/69, pulse 77, temperature 98.5 F (36.9 C), resp. rate 20, height 6' (1.829 m), weight 185 lb (83.9 kg), SpO2 98 %.  GENERAL:  40 y.o.-year-old patient lying in the bed with no acute distress.  EYES: Pupils equal, round, reactive to light and accommodation. No scleral icterus.  HEENT: Head atraumatic, normocephalic. Oropharynx and nasopharynx clear.  NECK:  Supple, no jugular venous distention. No thyroid enlargement, no tenderness.  LUNGS: Normal breath sounds bilaterally, no wheezing, rales,rhonchi or crepitation. No use of accessory muscles of respiration.  CARDIOVASCULAR: S1, S2 normal. No murmurs, rubs, or gallops.  ABDOMEN: Soft, nontender, nondistended. Bowel sounds present. No organomegaly or mass.  EXTREMITIES: No pedal edema, cyanosis, or clubbing.  NEUROLOGIC: Patient currently drowsy needed exam PSYCHIATRIC: The patient drowsy SKIN: No obvious rash, lesion, or ulcer.   LABORATORY PANEL:   CBC  Recent Labs Lab 11/10/15 0849  WBC 20.3*  HGB 13.4  HCT 41.0  PLT 260  MCV 88.8  MCH 29.1  MCHC 32.8  RDW 15.4*  LYMPHSABS 2.6  MONOABS 1.5*  EOSABS 0.1  BASOSABS 0.1   ------------------------------------------------------------------------------------------------------------------  Chemistries   Recent Labs Lab 11/10/15 0849  NA 143  K 4.0  CL 109  CO2 27  GLUCOSE 121*  BUN 10  CREATININE 0.88  CALCIUM 8.9    ------------------------------------------------------------------------------------------------------------------ estimated creatinine clearance is 121.3 mL/min (by C-G formula based on SCr of 0.88 mg/dL). ------------------------------------------------------------------------------------------------------------------ No results for input(s): TSH, T4TOTAL, T3FREE, THYROIDAB in the last 72 hours.  Invalid input(s): FREET3   Coagulation profile No results for input(s): INR, PROTIME in the last 168 hours. ------------------------------------------------------------------------------------------------------------------- No results for input(s): DDIMER in the last 72 hours. -------------------------------------------------------------------------------------------------------------------  Cardiac Enzymes  Recent Labs Lab 11/10/15 1441  TROPONINI 0.05*   ------------------------------------------------------------------------------------------------------------------ Invalid input(s): POCBNP  ---------------------------------------------------------------------------------------------------------------  Urinalysis No results found for: COLORURINE, APPEARANCEUR, LABSPEC, PHURINE, GLUCOSEU, HGBUR, BILIRUBINUR, KETONESUR, PROTEINUR, UROBILINOGEN, NITRITE, LEUKOCYTESUR   RADIOLOGY: Ct Pelvis W Contrast  Result Date: 11/10/2015 CLINICAL DATA:  Scrotal and perineal abscess. EXAM: CT PELVIS WITH CONTRAST TECHNIQUE: Multidetector CT imaging of the pelvis was performed using the standard protocol following the bolus administration of intravenous contrast. CONTRAST:  ISOVUE-300 IOPAMIDOL (ISOVUE-300) INJECTION 61% COMPARISON:  CT scan of September 26, 2015. FINDINGS: Urinary Tract: Urinary bladder is decompressed. Visualized portions of lower poles of both kidneys appear normal. No definite ureteral dilatation is noted. Bowel:  There is no evidence of bowel obstruction. Vascular/Lymphatic:  Atherosclerosis of abdominal aorta is noted. 3 cm infrarenal abdominal aortic aneurysm is noted. Bilateral inguinal adenopathy is noted with 3.2 x 1.5 cm lymph node seen in right inguinal region which is enlarged compared to prior exam. Reproductive:  Prostate gland appears normal. Other: Large amount of inflammatory changes are noted in the left perineal region, although no large defined fluid collection is seen in this area. The abscess noted on prior exam appears to have significantly improved. However, inflammatory changes are seen involving the scrotum, with 2.3 cm rounded low density seen in the right scrotum concerning for possible abscess. Musculoskeletal: No significant osseous abnormality is noted. IMPRESSION: 3 cm infrarenal abdominal aortic aneurysm. Recommend followup by ultrasound in 3 years. This recommendation follows ACR consensus guidelines: White Paper of the ACR Incidental Findings Committee II on Vascular Findings. J Am Coll Radiol 2013; 10:789-794. Bilateral inguinal adenopathy is noted, right greater than left, which most likely is inflammatory in origin. Extensive inflammatory changes are seen in the left perineal region, although the abscess noted on prior exam appears to be significantly improved. This is consistent with extensive cellulitis. There is noted an increased amount of inflammatory changes involving the scrotum compared to prior exam, with 2.3 cm low density seen in right scrotum concerning for possible abscess. This is new since prior exam. Electronically Signed   By: Lupita Raider, M.D.   On: 11/10/2015 09:59    EKG: Orders placed or performed during the hospital encounter of 11/10/15  . EKG 12-Lead  . EKG 12-Lead    IMPRESSION AND PLAN: Patient is a 40 year old noted to have postop EKG changes subsequently noticed to have new onset systolic dysfunction with a cardiac thrombus  1. Cardiac thrombus suspect due to his low EF Cardiology has Arty started him on  heparin Aspirin daily  2. 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 Accelerated hypertension continue when necessary labetalol and  hydralazine  4. Scrotal abscess therapy per urology antibiotics per urology  5. Diabetes type 2 Place him on sliding scale insulin and check a hemoglobin A1c  6. Miscellaneous heparin for DVT prophylaxis All the records are reviewed and case discussed with ED provider. Management plans discussed with the patient, family and they are in agreement.  CODE STATUS: Code Status History    Date Active Date Inactive Code Status Order ID Comments User Context   09/26/2015  9:42 PM 09/27/2015  5:40 AM Full Code [id number]  Ricarda Frame, MD Inpatient       TOTAL TIME TAKING CARE OF THIS PATIENT:55 minutes.    Auburn Bilberry M.D on 11/10/2015 at 4:40 PM  Between 7am to 6pm - Pager - [phone]  After 6pm go to [email or web address] - password [email or web address] [facility]  [facility]  Office  [phone]  CC: Primary care physician; No PCP Per Patient

## 2015-11-10 NOTE — Progress Notes (Signed)
ANTICOAGULATION CONSULT NOTE - Initial Consult  Pharmacy Consult for Heparin Drip Indication: Cardiac Thrombus/DVT prophylaxis  No Known Allergies  Patient Measurements: Height: 6' (182.9 cm) Weight: 185 lb (83.9 kg) IBW/kg (Calculated) : 77.6 Heparin Dosing Weight: 83.9 kg  Vital Signs: Temp: 97 F (36.1 C) (10/09 2000) Temp Source: Axillary (10/09 2000) BP: 135/94 (10/09 2300) Pulse Rate: 71 (10/09 2300)  Labs:  Recent Labs  11/10/15 0849 11/10/15 1441 11/10/15 2027 11/10/15 2257  HGB 13.4  --   --   --   HCT 41.0  --   --   --   PLT 260  --   --   --   HEPARINUNFRC  --   --   --  0.24*  CREATININE 0.88  --   --   --   TROPONINI  --  0.05* 0.05*  --     Estimated Creatinine Clearance: 121.3 mL/min (by C-G formula based on SCr of 0.88 mg/dL).   Medical History: Past Medical History:  Diagnosis Date  . Abscess    to rectum  . Diabetes mellitus (HCC)   . Hypertension     Medications:  Scheduled:  . aspirin  81 mg Oral Daily  . [START ON 11/11/2015] atorvastatin  40 mg Oral q1800  . [START ON 11/11/2015] carvedilol  3.125 mg Oral BID WC  . clindamycin (CLEOCIN) IV  600 mg Intravenous Q8H  . docusate sodium  100 mg Oral BID  . [START ON 11/11/2015] Influenza vac split quadrivalent PF  0.5 mL Intramuscular Tomorrow-1000  . insulin aspart  0-5 Units Subcutaneous QHS  . insulin aspart  0-9 Units Subcutaneous TID WC  . piperacillin-tazobactam (ZOSYN)  IV  3.375 g Intravenous Q8H  . [START ON 11/11/2015] pneumococcal 23 valent vaccine  0.5 mL Intramuscular Tomorrow-1000  . sodium chloride flush       Infusions:  . sodium chloride 75 mL/hr at 11/10/15 2300  . heparin 1,400 Units/hr (11/10/15 2300)    Assessment: 40 yo male, LV Thrombus detected and stat troponin came back at 0.05 during procedure for scrotal I&D.  Pharmacy consulted to begin heparin.   Goal of Therapy:  Heparin level 0.3-0.7 units/ml Monitor platelets by anticoagulation protocol: Yes    Plan:  First level results slightly low at 0.24. Will bolus 1250 units then increase to 1550 units/hr. Will recheck level in 6 hours.  Olene FlossMelissa D Tavien Chestnut, PharmD Clinical Pharmacist 11/10/2015,11:39 PM

## 2015-11-10 NOTE — Op Note (Signed)
Date of procedure: 11/10/15  Preoperative diagnosis:  1. Scrotal abscess 2. Perineal abscess 3. Probable hidradenitis   Postoperative diagnosis:  1. Same as above   Procedure: 1. Scrotal exploration 2. I&D of scrotal abscess  Surgeon: Vanna ScotlandAshley Gaetana Kawahara, MD  Anesthesia: General  Complications: None  Intraoperative findings: Purulent drainage with multiple sinus tracts involving the left hemiscrotum, left inguinal area, left perineum and left perirectal area  EBL: Minimal  Specimens: Wound culture, left hemiscrotum  Drains: Penrose drain 2  Indication: Joshua Tate is a 40 y.o. patient with multiple recurrent perineal and scrotal abscesses who returns to the emergency room today with a recurrence.  After reviewing the management options for treatment, he elected to proceed with the above surgical procedure(s). We have discussed the potential benefits and risks of the procedure, side effects of the proposed treatment, the likelihood of the patient achieving the goals of the procedure, and any potential problems that might occur during the procedure or recuperation. Informed consent has been obtained.  Description of procedure:  The patient was taken to the operating room and general anesthesia was induced.  The patient was placed in the dorsal lithotomy position, prepped and draped in the usual sterile fashion, and preoperative antibiotics were administered. A preoperative time-out was performed.   Upon evaluation, the patient was noted to have multiple draining sinus tracts involving left hemiscrotum, left inguinal area, left perineum, and an area of induration adjacent to his rectum on the left side. Intraoperatively, general surgery was called and Dr. Aleen CampiPiscoya came in to evaluate the patient as well. At this point time, please see his operative note for additional details but an incision within the perineal abscess and a tract connecting to the left perirectal abscess was created,  the tract tunneled and a Penrose drain placed. This was washed out as well.  At this point in time, a transverse incision was made over the left hemiscrotum and area of fluctuation where purulent drainage was seen. There is an area just superior and medial to this is a tract draining purulent fluid as well as well as a tract beneath and one in the left inguinal area. Upon opening the abscess cavity, finger dissection was performed posterior and inferiorly towards the perineum where a tract between the 2 was able to be defined. At this point time, the Penrose drain was connected to through the left scrotal incision to the perineal incision and tied in place using 3 silk ties. There continued to be persistent induration in the left inguinal with scarring and multiple tracts consistent with likely chronic hidradenitis. Local anesthetic was injected using 1% lidocaine and quarter percent Marcaine into the scrotal incision and a portion of the inguinal incision. A small amount of packing was placed within the scrotal incision as well. The patient was then cleaned and dried, and dressing of 4 x 4's and ABDs pad was applied. Mesh panties were also applied. He was then reversed from anesthesia and taken to the PACU in stable condition.  Plan: Patient will be admitted for IV antibiotics. We'll follow-up wound culture.    Vanna ScotlandAshley Gordy Goar, M.D.

## 2015-11-10 NOTE — Progress Notes (Signed)
ANTICOAGULATION CONSULT NOTE - Initial Consult  Pharmacy Consult for Heparin Drip Indication: Cardiac Thrombus/DVT prophylaxis  No Known Allergies  Patient Measurements: Height: 6' (182.9 cm) Weight: 185 lb (83.9 kg) IBW/kg (Calculated) : 77.6 Heparin Dosing Weight: 83.9 kg  Vital Signs: Temp: 97.7 F (36.5 C) (10/09 1652) Temp Source: Oral (10/09 1652) BP: 119/77 (10/09 1700) Pulse Rate: 74 (10/09 1700)  Labs:  Recent Labs  11/10/15 0849 11/10/15 1441  HGB 13.4  --   HCT 41.0  --   PLT 260  --   CREATININE 0.88  --   TROPONINI  --  0.05*    Estimated Creatinine Clearance: 121.3 mL/min (by C-G formula based on SCr of 0.88 mg/dL).   Medical History: Past Medical History:  Diagnosis Date  . Abscess    to rectum  . Diabetes mellitus (HCC)   . Hypertension     Medications:  Scheduled:  . clindamycin (CLEOCIN) IV  600 mg Intravenous Q8H  . docusate sodium  100 mg Oral BID  . insulin aspart  0-9 Units Subcutaneous TID WC  . piperacillin-tazobactam (ZOSYN)  IV  3.375 g Intravenous Q8H  . sodium chloride flush       Infusions:  . sodium chloride 125 mL/hr at 11/10/15 1721  . heparin 1,400 Units/hr (11/10/15 1703)    Assessment: 40 yo male, LV Thrombus detected and stat troponin came back at 0.05 during procedure for scrotal I&D.  Pharmacy consulted to begin heparin.   Goal of Therapy:  Heparin level 0.3-0.7 units/ml Monitor platelets by anticoagulation protocol: Yes   Plan:  Give 4000 units bolus x 1 Start heparin infusion at 1400 units/hr Check anti-Xa level in 6 hours and daily while on heparin Continue to monitor H&H and platelets   HL ordered for 10/9 at 23:00.   Stormy CardKatsoudas,Joseh Sjogren K, RPh Clinical Pharmacist 11/10/2015,6:02 PM

## 2015-11-11 ENCOUNTER — Encounter: Payer: Self-pay | Admitting: Urology

## 2015-11-11 DIAGNOSIS — I255 Ischemic cardiomyopathy: Secondary | ICD-10-CM

## 2015-11-11 DIAGNOSIS — I10 Essential (primary) hypertension: Secondary | ICD-10-CM

## 2015-11-11 DIAGNOSIS — E1159 Type 2 diabetes mellitus with other circulatory complications: Secondary | ICD-10-CM

## 2015-11-11 DIAGNOSIS — N492 Inflammatory disorders of scrotum: Principal | ICD-10-CM

## 2015-11-11 LAB — COMPREHENSIVE METABOLIC PANEL
ALK PHOS: 66 U/L (ref 38–126)
ALT: 8 U/L — ABNORMAL LOW (ref 17–63)
ANION GAP: 6 (ref 5–15)
AST: 15 U/L (ref 15–41)
Albumin: 3 g/dL — ABNORMAL LOW (ref 3.5–5.0)
BUN: 13 mg/dL (ref 6–20)
CALCIUM: 8.5 mg/dL — AB (ref 8.9–10.3)
CHLORIDE: 108 mmol/L (ref 101–111)
CO2: 25 mmol/L (ref 22–32)
Creatinine, Ser: 0.82 mg/dL (ref 0.61–1.24)
GFR calc non Af Amer: >60 mL/min (ref >60)
Glucose, Bld: 179 mg/dL — ABNORMAL HIGH (ref 65–99)
Potassium: 4 mmol/L (ref 3.5–5.1)
SODIUM: 139 mmol/L (ref 135–145)
Total Bilirubin: 0.5 mg/dL (ref 0.3–1.2)
Total Protein: 7.3 g/dL (ref 6.5–8.1)

## 2015-11-11 LAB — CBC
HCT: 36 % — ABNORMAL LOW (ref 40.0–52.0)
Hemoglobin: 12.1 g/dL — ABNORMAL LOW (ref 13.0–18.0)
MCH: 29.8 pg (ref 26.0–34.0)
MCHC: 33.6 g/dL (ref 32.0–36.0)
MCV: 88.8 fL (ref 80.0–100.0)
PLATELETS: 234 10*3/uL (ref 150–440)
RBC: 4.05 MIL/uL — ABNORMAL LOW (ref 4.40–5.90)
RDW: 15.8 % — ABNORMAL HIGH (ref 11.5–14.5)
WBC: 17.8 10*3/uL — ABNORMAL HIGH (ref 3.8–10.6)

## 2015-11-11 LAB — GLUCOSE, CAPILLARY
GLUCOSE-CAPILLARY: 95 mg/dL (ref 65–99)
Glucose-Capillary: 102 mg/dL — ABNORMAL HIGH (ref 65–99)
Glucose-Capillary: 167 mg/dL — ABNORMAL HIGH (ref 65–99)
Glucose-Capillary: 111 mg/dL — ABNORMAL HIGH (ref 65–99)

## 2015-11-11 LAB — LIPID PANEL
CHOLESTEROL: 104 mg/dL (ref 0–200)
HDL: 42 mg/dL (ref >40)
LDL CALC: 54 mg/dL (ref 0–99)
TRIGLYCERIDES: 38 mg/dL (ref <150)
Total CHOL/HDL Ratio: 2.5 RATIO
VLDL: 8 mg/dL (ref 0–40)

## 2015-11-11 LAB — HEPARIN LEVEL (UNFRACTIONATED)
Heparin Unfractionated: 0.38 IU/mL (ref 0.30–0.70)
Heparin Unfractionated: 0.36 IU/mL (ref 0.30–0.70)

## 2015-11-11 LAB — TROPONIN I
TROPONIN I: 0.04 ng/mL — AB (ref <0.03)
TROPONIN I: 0.04 ng/mL — AB (ref <0.03)

## 2015-11-11 LAB — MRSA PCR SCREENING: MRSA by PCR: NEGATIVE

## 2015-11-11 MED ORDER — LOSARTAN POTASSIUM 25 MG PO TABS
25.0000 mg | ORAL_TABLET | Freq: Every day | ORAL | Status: DC
  Administered 2015-11-11 – 2015-11-12 (×2): 25 mg via ORAL
  Filled 2015-11-11: qty 1
  Filled 2015-11-11: qty 2

## 2015-11-11 MED ORDER — PNEUMOCOCCAL VAC POLYVALENT 25 MCG/0.5ML IJ INJ: 0.5000 mL | INJECTION | INTRAMUSCULAR | Status: DC

## 2015-11-11 MED ORDER — INFLUENZA VAC SPLIT QUAD 0.5 ML IM SUSY: 0.5000 mL | PREFILLED_SYRINGE | INTRAMUSCULAR | Status: DC

## 2015-11-11 MED ORDER — NICOTINE 21 MG/24HR TD PT24
21.0000 mg | MEDICATED_PATCH | Freq: Every day | TRANSDERMAL | Status: DC
  Administered 2015-11-11 – 2015-11-12 (×2): 21 mg via TRANSDERMAL
  Filled 2015-11-11 (×2): qty 1

## 2015-11-11 NOTE — Progress Notes (Signed)
Disposed of patient's old guaze pads and ABD pad in perineal region. Also disposed of old underwear. Applied new diaper for absorption purposes and a new ABD pad. Old material significantly soiled with blood. Patient tolerated well. Jari FavreSteven M [facility]

## 2015-11-11 NOTE — Progress Notes (Signed)
Patient: Joshua Tate / Admit Date: 11/10/2015 / Date of Encounter: 11/11/2015, 8:23 AM   Subjective: Feels well as morning, No significant perineal at rest in bed No significant arrhythmia overnight, no chest pain or shortness of breath Echocardiogram results reviewed, cardiomyopathy noted Again he reiterates shortness of  8-10 months Used to be athlete, now difficulty walking long distances Some chest pain with heavy exertion such as having sex  Review of Systems: Review of Systems  Constitutional: Negative.   Respiratory:       Shortness of breath on exertion  Cardiovascular: Negative.   Gastrointestinal: Negative.   Musculoskeletal: Negative.   Neurological: Negative.   Psychiatric/Behavioral: Negative.   All other systems reviewed and are negative.    Objective: Telemetry:  Physical Exam: Blood pressure 125/81, pulse 73, temperature 98.3 F (36.8 C), temperature source Oral, resp. rate 19, height 6' (1.829 m), weight 185 lb (83.9 kg), SpO2 98 %. Body mass index is 25.09 kg/m. General: Well developed, well nourished, in no acute distress. Head: Normocephalic, atraumatic, sclera non-icteric, no xanthomas, nares are without discharge. Neck: Negative for carotid bruits. JVP not elevated. Lungs: Clear bilaterally to auscultation without wheezes, rales, or rhonchi. Breathing is unlabored. Heart: RRR S1 S2 without murmurs, rubs, or gallops.  Abdomen: Soft, non-tender, non-distended with normoactive bowel sounds. No rebound/guarding. Extremities: No clubbing or cyanosis. No edema. Distal pedal pulses are 2+ and equal bilaterally. Neuro: Alert and oriented X 3. Moves all extremities spontaneously. Psych:  Responds to questions appropriately with a normal affect.   Intake/Output Summary (Last 24 hours) at 11/11/15 0823 Last data filed at 11/11/15 0500  Gross per 24 hour  Intake          2255.55 ml  Output              425 ml  Net          1830.55 ml    Inpatient  Medications:  . aspirin  81 mg Oral Daily  . atorvastatin  40 mg Oral q1800  . carvedilol  3.125 mg Oral BID WC  . clindamycin (CLEOCIN) IV  600 mg Intravenous Q8H  . docusate sodium  100 mg Oral BID  . Influenza vac split quadrivalent PF  0.5 mL Intramuscular Tomorrow-1000  . insulin aspart  0-5 Units Subcutaneous QHS  . insulin aspart  0-9 Units Subcutaneous TID WC  . piperacillin-tazobactam (ZOSYN)  IV  3.375 g Intravenous Q8H  . pneumococcal 23 valent vaccine  0.5 mL Intramuscular Tomorrow-1000   Infusions:  . sodium chloride 75 mL/hr at 11/11/15 0500  . heparin 1,550 Units/hr (11/11/15 0500)    Labs:  Recent Labs  11/10/15 0849 11/11/15 0235  NA 143 139  K 4.0 4.0  CL 109 108  CO2 27 25  GLUCOSE 121* 179*  BUN 10 13  CREATININE 0.88 0.82  CALCIUM 8.9 8.5*    Recent Labs  11/11/15 0235  AST 15  ALT 8*  ALKPHOS 66  BILITOT 0.5  PROT 7.3  ALBUMIN 3.0*    Recent Labs  11/10/15 0849 11/11/15 0235  WBC 20.3* 17.8*  NEUTROABS 16.1*  --   HGB 13.4 12.1*  HCT 41.0 36.0*  MCV 88.8 88.8  PLT 260 234    Recent Labs  11/10/15 1441 11/10/15 2027 11/11/15 0235  TROPONINI 0.05* 0.05* 0.04*   Invalid input(s): POCBNP No results for input(s): HGBA1C in the last 72 hours.   Weights: Filed Weights   11/10/15 0731  Weight: 185  lb (83.9 kg)     Radiology/Studies:  Ct Pelvis W Contrast  Result Date: 11/10/2015 CLINICAL DATA:   IMPRESSION: 3 cm infrarenal abdominal aortic aneurysm. Recommend followup by ultrasound in 3 years. This recommendation follows ACR consensus guidelines: White Paper of the ACR Incidental Findings Committee II on Vascular Findings. J Am Coll Radiol 2013; 10:789-794. Bilateral inguinal adenopathy is noted, right greater than left, which most likely is inflammatory in origin. Extensive inflammatory changes are seen in the left perineal region, although the abscess noted on prior exam appears to be significantly improved. This is  consistent with extensive cellulitis. There is noted an increased amount of inflammatory changes involving the scrotum compared to prior exam, with 2.3 cm low density seen in right scrotum concerning for possible abscess. This is new since prior exam. Electronically Signed   By: Lupita RaiderJames  Green Jr, M.D.   On: 11/10/2015 09:59     Assessment and Plan  40 y.o. male  1. Abnormal EKG/cardiomyopathy/elevated troponin: Ejection fraction20-25% with AK of the mid-apicalanterior and anterolateral myocardium and possible LV thrombus -Cardiac enzymes negative Stable angina symptoms Long discussion with patient concerning  Cardiac catheterization, risk and benefit, timing of  Procedure He has indicated that he would like prior to catheterization, he would like to do the procedure in the near future He does not want a procedure that  Will delay his discharge as mother is taking care of 3 children, wife is working -Will need to make sure that at the time of his  Catheterization he would be Acceptable to start him on Plavix with no interruptions -Continue ASA, statin, Carvedilol, losartan Repeat troponin this morning  I have reviewed the risks, indications, and alternatives to cardiac catheterization, possible angioplasty, and stenting with the patient. Risks include but are not limited to bleeding, infection, vascular injury, stroke, myocardial infection, arrhythmia, kidney injury, radiation-related injury in the case of prolonged fluoroscopy use, emergency cardiac surgery, and death. The patient understands the risks of serious complication is 1-2 in 1000 with diagnostic cardiac cath and 1-2% or less with angioplasty/stenting.   2. Accelerated HTN: Medications as above, blood press  3. Scrotal abscess: -Per Urology  We will need to determine when he  Cardiac catheterization, possible PCI,start of Plavix  4. DM: -Per IM  5. Possible LV thrombus: -Likely in the setting of his cardiomyopathy -Heparin  gtt as above   Total encounter time more than 35 minutes  Greater than 50% was spent in counseling and coordination of care with the patient   Signed, Dossie Arbourim Rocklyn Mayberry, MD, Ph.D. [facility] 11/11/2015, 8:23 AM

## 2015-11-11 NOTE — Progress Notes (Signed)
[facility] [place] at [facility]   PATIENT NAME: Joshua JunesJohn Tate    MRN#:  [id number]  DATE OF BIRTH:  1975-05-14  SUBJECTIVE:  Hospital Day: 1 day Joshua Tate is a 40 y.o. male presenting with Abscess .   Overnight events: No overnight events Interval Events: No complaints his morning  REVIEW OF SYSTEMS:  CONSTITUTIONAL: No fever, fatigue or weakness.  EYES: No blurred or double vision.  EARS, NOSE, AND THROAT: No tinnitus or ear pain.  RESPIRATORY: No cough, shortness of breath, wheezing or hemoptysis.  CARDIOVASCULAR: No chest pain, orthopnea, edema.  GASTROINTESTINAL: No nausea, vomiting, diarrhea or abdominal pain.  GENITOURINARY: No dysuria, hematuria.  ENDOCRINE: No polyuria, nocturia,  HEMATOLOGY: No anemia, easy bruising or bleeding SKIN: No rash or lesion. MUSCULOSKELETAL: No joint pain or arthritis.   NEUROLOGIC: No tingling, numbness, weakness.  PSYCHIATRY: No anxiety or depression.   DRUG ALLERGIES:  No Known Allergies  VITALS:  Blood pressure (!) 142/94, pulse 86, temperature 98.2 F (36.8 C), temperature source Oral, resp. rate 20, height 6' (1.829 m), weight 83.9 kg (185 lb), SpO2 94 %.  PHYSICAL EXAMINATION:  VITAL SIGNS: Vitals:   11/11/15 1200 11/11/15 1252  BP: (!) 143/111 (!) 142/94  Pulse: 90 86  Resp: (!) 29 20  Temp:  98.2 F (36.8 C)   GENERAL:40 y.o.male currently in no acute distress.  HEAD: Normocephalic, atraumatic.  EYES: Pupils equal, round, reactive to light. Extraocular muscles intact. No scleral icterus.  MOUTH: Moist mucosal membrane. Dentition intact. No abscess noted.  EAR, NOSE, THROAT: Clear without exudates. No external lesions.  NECK: Supple. No thyromegaly. No nodules. No JVD.  PULMONARY: Clear to ascultation, without wheeze rails or rhonci. No use of accessory muscles, Good respiratory effort. good air entry bilaterally CHEST: Nontender to palpation.  CARDIOVASCULAR: S1 and S2. Regular rate and  rhythm. No murmurs, rubs, or gallops. No edema. Pedal pulses 2+ bilaterally.  GASTROINTESTINAL: Soft, nontender, nondistended. No masses. Positive bowel sounds. No hepatosplenomegaly.  MUSCULOSKELETAL: No swelling, clubbing, or edema. Range of motion full in all extremities.  NEUROLOGIC: Cranial nerves II through XII are intact. No gross focal neurological deficits. Sensation intact. Reflexes intact.  SKIN: No ulceration, lesions, rashes, or cyanosis. Skin warm and dry. Turgor intact.  PSYCHIATRIC: Mood, affect within normal limits. The patient is awake, alert and oriented x 3. Insight, judgment intact.      LABORATORY PANEL:   CBC  Recent Labs Lab 11/11/15 0235  WBC 17.8*  HGB 12.1*  HCT 36.0*  PLT 234   ------------------------------------------------------------------------------------------------------------------  Chemistries   Recent Labs Lab 11/11/15 0235  NA 139  K 4.0  CL 108  CO2 25  GLUCOSE 179*  BUN 13  CREATININE 0.82  CALCIUM 8.5*  AST 15  ALT 8*  ALKPHOS 66  BILITOT 0.5   ------------------------------------------------------------------------------------------------------------------  Cardiac Enzymes  Recent Labs Lab 11/11/15 1215  TROPONINI 0.04*   ------------------------------------------------------------------------------------------------------------------  RADIOLOGY:  Ct Pelvis W Contrast  Result Date: 11/10/2015 CLINICAL DATA:  Scrotal and perineal abscess. EXAM: CT PELVIS WITH CONTRAST TECHNIQUE: Multidetector CT imaging of the pelvis was performed using the standard protocol following the bolus administration of intravenous contrast. CONTRAST:  100mL ISOVUE-300 IOPAMIDOL (ISOVUE-300) INJECTION 61% COMPARISON:  CT scan of September 26, 2015. FINDINGS: Urinary Tract: Urinary bladder is decompressed. Visualized portions of lower poles of both kidneys appear normal. No definite ureteral dilatation is noted. Bowel:  There is no evidence of  bowel obstruction. Vascular/Lymphatic: Atherosclerosis of abdominal aorta  is noted. 3 cm infrarenal abdominal aortic aneurysm is noted. Bilateral inguinal adenopathy is noted with 3.2 x 1.5 cm lymph node seen in right inguinal region which is enlarged compared to prior exam. Reproductive:  Prostate gland appears normal. Other: Large amount of inflammatory changes are noted in the left perineal region, although no large defined fluid collection is seen in this area. The abscess noted on prior exam appears to have significantly improved. However, inflammatory changes are seen involving the scrotum, with 2.3 cm rounded low density seen in the right scrotum concerning for possible abscess. Musculoskeletal: No significant osseous abnormality is noted. IMPRESSION: 3 cm infrarenal abdominal aortic aneurysm. Recommend followup by ultrasound in 3 years. This recommendation follows ACR consensus guidelines: White Paper of the ACR Incidental Findings Committee II on Vascular Findings. J Am Coll Radiol 2013; 10:789-794. Bilateral inguinal adenopathy is noted, right greater than left, which most likely is inflammatory in origin. Extensive inflammatory changes are seen in the left perineal region, although the abscess noted on prior exam appears to be significantly improved. This is consistent with extensive cellulitis. There is noted an increased amount of inflammatory changes involving the scrotum compared to prior exam, with 2.3 cm low density seen in right scrotum concerning for possible abscess. This is new since prior exam. Electronically Signed   By: Lupita Raider, M.D.   On: 11/10/2015 09:59    EKG:   Orders placed or performed during the hospital encounter of 11/10/15  . EKG 12-Lead  . EKG 12-Lead    ASSESSMENT AND PLAN:   Joshua Tate is a 40 y.o. male presenting with Abscess . Admitted 11/10/2015 : Day #: 1 day 1. Scrotal abscess status post I&D currently on doxycycline and clindamycin per urology follow  culture data and adjust antibiotics accordingly I don't see the benefit for both of these at this time and would recommend downgrading 2. Intramural thrombus: Heparin 3. Acute on chronic systolic congestive heart failure cardiology involved recommending ischemic workup 4. Type 2 diabetes insulin sliding scale hemoglobin A1c pending  Miscellaneous: Transfer out of intensive care unit, plan for likely discharge tomorrow pending culture data-patient is refusing cardiac catheterization at this time  All the records are reviewed and case discussed with Care Management/Social Workerr. Management plans discussed with the patient, family and they are in agreement.  CODE STATUS: full TOTAL TIME TAKING CARE OF THIS PATIENT: 28 minutes.   POSSIBLE D/C IN 1-2DAYS, DEPENDING ON CLINICAL CONDITION.   Hower,  Mardi Mainland.D on 11/11/2015 at 2:25 PM  Between 7am to 6pm - Pager - [phone]  After 6pm: House Pager: - [phone]  [facility]  Office  [phone]  CC: Primary care physician; No PCP Per Patient

## 2015-11-11 NOTE — Progress Notes (Signed)
ANTICOAGULATION CONSULT NOTE - Initial Consult  Pharmacy Consult for Heparin Drip Indication: Cardiac Thrombus/DVT prophylaxis  No Known Allergies  Patient Measurements: Height: 6' (182.9 cm) Weight: 185 lb (83.9 kg) IBW/kg (Calculated) : 77.6 Heparin Dosing Weight: 83.9 kg  Vital Signs: Temp: 98.3 F (36.8 C) (10/10 0400) Temp Source: Oral (10/10 0400) BP: 125/81 (10/10 0500) Pulse Rate: 73 (10/10 0500)  Labs:  Recent Labs  11/10/15 0849 11/10/15 1441 11/10/15 2027 11/10/15 2257 11/11/15 0235 11/11/15 0625  HGB 13.4  --   --   --  12.1*  --   HCT 41.0  --   --   --  36.0*  --   PLT 260  --   --   --  234  --   HEPARINUNFRC  --   --   --  0.24*  --  0.38  CREATININE 0.88  --   --   --  0.82  --   TROPONINI  --  0.05* 0.05*  --  0.04*  --     Estimated Creatinine Clearance: 130.1 mL/min (by C-G formula based on SCr of 0.82 mg/dL).   Medical History: Past Medical History:  Diagnosis Date  . Abscess    to rectum  . Diabetes mellitus (HCC)   . Hypertension     Medications:  Scheduled:  . aspirin  81 mg Oral Daily  . atorvastatin  40 mg Oral q1800  . carvedilol  3.125 mg Oral BID WC  . clindamycin (CLEOCIN) IV  600 mg Intravenous Q8H  . docusate sodium  100 mg Oral BID  . Influenza vac split quadrivalent PF  0.5 mL Intramuscular Tomorrow-1000  . insulin aspart  0-5 Units Subcutaneous QHS  . insulin aspart  0-9 Units Subcutaneous TID WC  . piperacillin-tazobactam (ZOSYN)  IV  3.375 g Intravenous Q8H  . pneumococcal 23 valent vaccine  0.5 mL Intramuscular Tomorrow-1000   Infusions:  . sodium chloride 75 mL/hr at 11/11/15 0500  . heparin 1,550 Units/hr (11/11/15 0500)    Assessment: 40 yo male, LV Thrombus detected and stat troponin came back at 0.05 during procedure for scrotal I&D.  Pharmacy consulted to begin heparin.   Goal of Therapy:  Heparin level 0.3-0.7 units/ml Monitor platelets by anticoagulation protocol: Yes   Plan:  Heparin level  =0.38. This is the first therapeutic level. Continue drip at 1550 units/hr and check a confirmation level in 6 hours.  Olene FlossMelissa D Daanish Copes, PharmD Clinical Pharmacist 11/11/2015,6:55 AM

## 2015-11-11 NOTE — Progress Notes (Signed)
ANTICOAGULATION CONSULT NOTE - Initial Consult  Pharmacy Consult for Heparin Drip Indication: Cardiac Thrombus/DVT prophylaxis  No Known Allergies  Patient Measurements: Height: 6' (182.9 cm) Weight: 185 lb (83.9 kg) IBW/kg (Calculated) : 77.6 Heparin Dosing Weight: 83.9 kg  Vital Signs: Temp: 98.2 F (36.8 C) (10/10 1252) Temp Source: Oral (10/10 1252) BP: 142/94 (10/10 1252) Pulse Rate: 86 (10/10 1252)  Labs:  Recent Labs  11/10/15 0849  11/10/15 2027 11/10/15 2257 11/11/15 0235 11/11/15 0625 11/11/15 1215  HGB 13.4  --   --   --  12.1*  --   --   HCT 41.0  --   --   --  36.0*  --   --   PLT 260  --   --   --  234  --   --   HEPARINUNFRC  --   --   --  0.24*  --  0.38 0.36  CREATININE 0.88  --   --   --  0.82  --   --   TROPONINI  --   < > 0.05*  --  0.04*  --  0.04*  < > = values in this interval not displayed.  Estimated Creatinine Clearance: 130.1 mL/min (by C-G formula based on SCr of 0.82 mg/dL).   Medical History: Past Medical History:  Diagnosis Date  . Abscess    to rectum  . Diabetes mellitus (HCC)   . Hypertension     Medications:  Scheduled:  . aspirin  81 mg Oral Daily  . atorvastatin  40 mg Oral q1800  . carvedilol  3.125 mg Oral BID WC  . clindamycin (CLEOCIN) IV  600 mg Intravenous Q8H  . docusate sodium  100 mg Oral BID  . [START ON 11/12/2015] Influenza vac split quadrivalent PF  0.5 mL Intramuscular Tomorrow-1000  . insulin aspart  0-5 Units Subcutaneous QHS  . insulin aspart  0-9 Units Subcutaneous TID WC  . losartan  25 mg Oral Daily  . nicotine  21 mg Transdermal Daily  . piperacillin-tazobactam (ZOSYN)  IV  3.375 g Intravenous Q8H  . [START ON 11/12/2015] pneumococcal 23 valent vaccine  0.5 mL Intramuscular Tomorrow-1000   Infusions:  . heparin 1,550 Units/hr (11/11/15 1200)    Assessment: 40 yo male, LV Thrombus detected and stat troponin came back at 0.05 during procedure for scrotal I&D.  Pharmacy consulted to begin  heparin.   Goal of Therapy:  Heparin level 0.3-0.7 units/ml Monitor platelets by anticoagulation protocol: Yes   Plan:  10/10 0625 Heparin level =0.38. First therapeutic level.   10/10 1215 Heparin Level= 0.36. This is second therapeutic level. Continue drip at 1550 units/hr and recheck HL wiith am labs.   Gardner CandleSheema M Davida Falconi, PharmD Clinical Pharmacist 11/11/2015,1:25 PM

## 2015-11-11 NOTE — Progress Notes (Signed)
Urology Consult Follow Up  Subjective: Patient is resting comfortably. He states he had scrotal pain during the night.  Staff stated he requested two Percocet tablets.  Staff also states they had changed the dressing twice due to drainage.   WBC count down to 17.8 to 20.3.  Afebrile VSS, but increased respirations during the night.  Cultures are pending.   Anti-infectives: Anti-infectives    Start     Dose/Rate Route Frequency Ordered Stop   11/10/15 1900  clindamycin (CLEOCIN) IVPB 600 mg     600 mg 100 mL/hr over 30 Minutes Intravenous Every 8 hours 11/10/15 1650     11/10/15 1800  piperacillin-tazobactam (ZOSYN) IVPB 3.375 g     3.375 g 12.5 mL/hr over 240 Minutes Intravenous Every 8 hours 11/10/15 1650     11/10/15 1045  clindamycin (CLEOCIN) IVPB 600 mg     600 mg 100 mL/hr over 30 Minutes Intravenous  Once 11/10/15 1045 11/10/15 1220      Current Facility-Administered Medications  Medication Dose Route Frequency Provider Last Rate Last Dose  . 0.9 %  sodium chloride infusion   Intravenous Continuous Iran Ouch, MD 75 mL/hr at 11/11/15 0500    . acetaminophen (TYLENOL) tablet 650 mg  650 mg Oral Q4H PRN Vanna Scotland, MD      . aspirin chewable tablet 81 mg  81 mg Oral Daily Iran Ouch, MD   81 mg at 11/10/15 2050  . atorvastatin (LIPITOR) tablet 40 mg  40 mg Oral q1800 Iran Ouch, MD      . carvedilol (COREG) tablet 3.125 mg  3.125 mg Oral BID WC Iran Ouch, MD      . clindamycin (CLEOCIN) IVPB 600 mg  600 mg Intravenous Q8H Vanna Scotland, MD   600 mg at 11/11/15 0229  . diphenhydrAMINE (BENADRYL) injection 12.5 mg  12.5 mg Intravenous Q6H PRN Vanna Scotland, MD       Or  . diphenhydrAMINE (BENADRYL) 12.5 MG/5ML elixir 12.5 mg  12.5 mg Oral Q6H PRN Vanna Scotland, MD      . docusate sodium (COLACE) capsule 100 mg  100 mg Oral BID Vanna Scotland, MD   100 mg at 11/10/15 2050  . heparin ADULT infusion 100 units/mL (25000 units/231mL sodium chloride 0.45%)   1,550 Units/hr Intravenous Continuous Olene Floss, RPH 15.5 mL/hr at 11/11/15 0500 1,550 Units/hr at 11/11/15 0500  . hydrALAZINE (APRESOLINE) injection 10 mg  10 mg Intravenous Q8H PRN Sondra Barges, PA-C      . Influenza vac split quadrivalent PF (FLUARIX) injection 0.5 mL  0.5 mL Intramuscular Tomorrow-1000 Auburn Bilberry, MD      . insulin aspart (novoLOG) injection 0-5 Units  0-5 Units Subcutaneous QHS Shaune Pollack, MD   3 Units at 11/10/15 2050  . insulin aspart (novoLOG) injection 0-9 Units  0-9 Units Subcutaneous TID WC Auburn Bilberry, MD      . morphine 2 MG/ML injection 2-4 mg  2-4 mg Intravenous Q2H PRN Vanna Scotland, MD      . ondansetron [facility]) injection 4 mg  4 mg Intravenous Q4H PRN Vanna Scotland, MD      . oxyCODONE-acetaminophen (PERCOCET/ROXICET) 5-325 MG per tablet 1-2 tablet  1-2 tablet Oral Q4H PRN Vanna Scotland, MD   2 tablet at 11/11/15 0228  . piperacillin-tazobactam (ZOSYN) IVPB 3.375 g  3.375 g Intravenous Q8H Vanna Scotland, MD   3.375 g at 11/11/15 0229  . pneumococcal 23 valent vaccine (PNU-IMMUNE) injection 0.5 mL  0.5 mL Intramuscular Tomorrow-1000 Auburn BilberryShreyang Patel, MD         Objective: Vital signs in last 24 hours: Temp:  [97 F (36.1 C)-99 F (37.2 C)] 98.3 F (36.8 C) (10/10 0400) Pulse Rate:  [71-98] 73 (10/10 0500) Resp:  [13-24] 19 (10/10 0500) BP: (107-158)/(69-110) 125/81 (10/10 0500) SpO2:  [93 %-100 %] 98 % (10/10 0500)  Intake/Output from previous day: 10/09 0701 - 10/10 0700 In: 2255.6 [P.O.:240; I.V.:1815.6; IV Piggyback:200] Out: 425 [Urine:400; Blood:25] Intake/Output this shift: No intake/output data recorded.   Physical Exam Constitutional: Well nourished. Alert and oriented, No acute distress. HEENT: [place] AT, moist mucus membranes. Trachea midline, no masses. Cardiovascular: No clubbing, cyanosis, or edema. Respiratory: Normal respiratory effort, no increased work of breathing. GI: Abdomen is soft, non tender, non distended,  no abdominal masses. Liver and spleen not palpable.  No hernias appreciated.  Stool sample for occult testing is not indicated.   GU: No CVA tenderness.  No bladder fullness or masses. 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   Scrotal and perineal wounds with Penrose drain in place.  4 x 4 dressing with scant blood, but dressing recently changed.  Abscess cavity with good granulation tissue.  No fluctuance, erythema or crepitus.  . Skin: No rashes, bruises or suspicious lesions. Lymph: No cervical or inguinal adenopathy. Neurologic: Grossly intact, no focal deficits, moving all 4 extremities. Psychiatric: Normal mood and affect.  Lab Results:   Recent Labs  11/10/15 0849 11/11/15 0235  WBC 20.3* 17.8*  HGB 13.4 12.1*  HCT 41.0 36.0*  PLT 260 234   BMET  Recent Labs  11/10/15 0849 11/11/15 0235  NA 143 139  K 4.0 4.0  CL 109 108  CO2 27 25  GLUCOSE 121* 179*  BUN 10 13  CREATININE 0.88 0.82  CALCIUM 8.9 8.5*   PT/INR No results for input(s): LABPROT, INR in the last 72 hours. ABG No results for input(s): PHART, HCO3 in the last 72 hours.  Invalid input(s): PCO2, PO2  Studies/Results: Ct Pelvis W Contrast  Result Date: 11/10/2015 CLINICAL DATA:  Scrotal and perineal abscess. EXAM: CT PELVIS WITH CONTRAST TECHNIQUE: Multidetector CT imaging of the pelvis was performed using the standard protocol following the bolus administration of intravenous contrast. CONTRAST:  100mL ISOVUE-300 IOPAMIDOL (ISOVUE-300) INJECTION 61% COMPARISON:  CT scan of September 26, 2015. FINDINGS: Urinary Tract: Urinary bladder is decompressed. Visualized portions of lower poles of both kidneys appear normal. No definite ureteral dilatation is noted. Bowel:  There is no evidence of bowel obstruction. Vascular/Lymphatic: Atherosclerosis of abdominal aorta is noted. 3 cm infrarenal abdominal aortic aneurysm is noted. Bilateral inguinal adenopathy is noted with 3.2 x 1.5 cm lymph node seen in right  inguinal region which is enlarged compared to prior exam. Reproductive:  Prostate gland appears normal. Other: Large amount of inflammatory changes are noted in the left perineal region, although no large defined fluid collection is seen in this area. The abscess noted on prior exam appears to have significantly improved. However, inflammatory changes are seen involving the scrotum, with 2.3 cm rounded low density seen in the right scrotum concerning for possible abscess. Musculoskeletal: No significant osseous abnormality is noted. IMPRESSION: 3 cm infrarenal abdominal aortic aneurysm. Recommend followup by ultrasound in 3 years. This recommendation follows ACR consensus guidelines: White Paper of the ACR Incidental Findings Committee II on Vascular Findings. J Am Coll Radiol 2013; 10:789-794. Bilateral inguinal adenopathy is noted, right greater than left, which most likely is inflammatory in  origin. Extensive inflammatory changes are seen in the left perineal region, although the abscess noted on prior exam appears to be significantly improved. This is consistent with extensive cellulitis. There is noted an increased amount of inflammatory changes involving the scrotum compared to prior exam, with 2.3 cm low density seen in right scrotum concerning for possible abscess. This is new since prior exam. Electronically Signed   By: Lupita Raider, M.D.   On: 11/10/2015 09:59     s/p Procedure(s): INCISION AND DRAINAGE ABSCESS 40 y/o DM who underwent I & D of scrotal, perineal and perirectal abscess on 11/10/2015 who found to have cardiac thrombus with an EF of 20% and systolic dysfunction admitted to ICU for IV antibiotics and anticoagulation therapy.    1. Scrotal, perineal and perirectal abscess  - WBC's have declined from 20.3 to 17.8  - continue current antibiotic regimen- will adjust if necessary when culture results are available  - continue dressing changes   2. Cardiac thrombus  - cardiology  consult appreciated  3. Type 2 DM  - followed by medicine  - encouraged the patient to obtain a local PCP to help manage his diabetes    LOS: 1 day    [facility] 11/11/2015   I have seen and examined the patient, labs/ imaging reviewed and discussed his management with Michiel Cowboy.  I reviewed the PA's note and agree with the documented findings and plan of care.

## 2015-11-11 NOTE — Anesthesia Postprocedure Evaluation (Signed)
Anesthesia Post Note  Patient: Joshua Tate  Procedure(s) Performed: Procedure(s) (LRB): INCISION AND DRAINAGE ABSCESS (N/A)  Patient location during evaluation: PACU Anesthesia Type: General Level of consciousness: awake and alert Pain management: pain level controlled Vital Signs Assessment: post-procedure vital signs reviewed and stable Respiratory status: spontaneous breathing, nonlabored ventilation, respiratory function stable and patient connected to nasal cannula oxygen Cardiovascular status: blood pressure returned to baseline and stable Postop Assessment: no signs of nausea or vomiting Anesthetic complications: no    Last Vitals:  Vitals:   11/11/15 0400 11/11/15 0500  BP: 114/81 125/81  Pulse: 81 73  Resp: (!) 22 19  Temp: 36.8 C     Last Pain:  Vitals:   11/11/15 0400  TempSrc: Oral  PainSc:                  Yevette EdwardsJames G Nilay Mangrum

## 2015-11-11 NOTE — Care Management (Signed)
After I/D of scrotal abscess, patient found to have abnormal ekg findings which prompted cardiology consult.  Patient found to have EF < 20% on echo and severe cardiomyopathy.  Also found to have a cardiac thrombus and currently is on a heparin drip.  Placed in icu stepdown for closer monitoring.  Patient is asymptomatic.  Has medicaid and independent prior to this episode of illness.

## 2015-11-12 LAB — CBC
HEMATOCRIT: 34.9 % — AB (ref 40.0–52.0)
Hemoglobin: 11.8 g/dL — ABNORMAL LOW (ref 13.0–18.0)
MCH: 30.1 pg (ref 26.0–34.0)
MCHC: 33.8 g/dL (ref 32.0–36.0)
MCV: 89 fL (ref 80.0–100.0)
Platelets: 256 10*3/uL (ref 150–440)
RBC: 3.92 MIL/uL — ABNORMAL LOW (ref 4.40–5.90)
RDW: 15.5 % — AB (ref 11.5–14.5)
WBC: 14.2 10*3/uL — AB (ref 3.8–10.6)

## 2015-11-12 LAB — GLUCOSE, CAPILLARY
GLUCOSE-CAPILLARY: 179 mg/dL — AB (ref 65–99)
Glucose-Capillary: 87 mg/dL (ref 65–99)

## 2015-11-12 LAB — AEROBIC/ANAEROBIC CULTURE (SURGICAL/DEEP WOUND)

## 2015-11-12 LAB — AEROBIC/ANAEROBIC CULTURE W GRAM STAIN (SURGICAL/DEEP WOUND)

## 2015-11-12 LAB — HEMOGLOBIN A1C
Hgb A1c MFr Bld: 5.7 % — ABNORMAL HIGH (ref 4.8–5.6)
MEAN PLASMA GLUCOSE: 117 mg/dL

## 2015-11-12 LAB — HEPARIN LEVEL (UNFRACTIONATED): Heparin Unfractionated: 0.36 IU/mL (ref 0.30–0.70)

## 2015-11-12 MED ORDER — OXYCODONE-ACETAMINOPHEN 5-325 MG PO TABS: 1.0000 | ORAL_TABLET | ORAL | 0 refills | Status: DC | PRN

## 2015-11-12 MED ORDER — CARVEDILOL 3.125 MG PO TABS: 3.1250 mg | ORAL_TABLET | Freq: Two times a day (BID) | ORAL | 0 refills | Status: DC

## 2015-11-12 MED ORDER — PANTOPRAZOLE SODIUM 40 MG PO TBEC
40.0000 mg | DELAYED_RELEASE_TABLET | Freq: Every day | ORAL | Status: DC
  Administered 2015-11-12: 40 mg via ORAL
  Filled 2015-11-12: qty 1

## 2015-11-12 MED ORDER — CLINDAMYCIN HCL 300 MG PO CAPS: 300.0000 mg | ORAL_CAPSULE | Freq: Three times a day (TID) | ORAL | 0 refills | Status: AC

## 2015-11-12 MED ORDER — LOSARTAN POTASSIUM 25 MG PO TABS: 25.0000 mg | ORAL_TABLET | Freq: Every day | ORAL | 0 refills | Status: DC

## 2015-11-12 MED ORDER — ATORVASTATIN CALCIUM 40 MG PO TABS: 40.0000 mg | ORAL_TABLET | Freq: Every day | ORAL | 0 refills | Status: DC

## 2015-11-12 MED ORDER — CALCIUM CARBONATE ANTACID 500 MG PO CHEW
1.0000 | CHEWABLE_TABLET | Freq: Four times a day (QID) | ORAL | Status: DC | PRN
Start: 2015-11-12 — End: 2015-11-12

## 2015-11-12 MED ORDER — ASPIRIN 81 MG PO CHEW: 81.0000 mg | CHEWABLE_TABLET | Freq: Every day | ORAL | 0 refills | Status: DC

## 2015-11-12 NOTE — Clinical Social Work Note (Signed)
MSW was informed that patient has been trying to find a ride home and he has been unsuccessful.  MSW spoke to patient and asked him if he was able to afford a cab, he said no, MSW also asked patient if he has ever used Medicaid transportation services, and patient did not know that Medicaid can sometimes provide transportation services.  MSW gave patient phone number to set up Medicaid transportation for future appointments and provided patient a cab voucher in order to get a ride home today.  MSW to sign of, please reconsult if other social work needs arise.  Ervin KnackEric R. Hassan Rowannterhaus, MSW [phone]  Mon-Fri 8a-4:30p 11/12/2015 3:36 PM

## 2015-11-12 NOTE — Progress Notes (Signed)
ANTICOAGULATION CONSULT NOTE - Initial Consult  Pharmacy Consult for Heparin Drip Indication: Cardiac Thrombus/DVT prophylaxis  No Known Allergies  Patient Measurements: Height: 6' (182.9 cm) Weight: 185 lb (83.9 kg) IBW/kg (Calculated) : 77.6 Heparin Dosing Weight: 83.9 kg  Vital Signs: Temp: 97.9 F (36.6 C) (10/10 2001) Temp Source: Oral (10/10 2001) BP: 150/90 (10/11 0032) Pulse Rate: 89 (10/11 0032)  Labs:  Recent Labs  11/10/15 0849  11/10/15 2027  11/11/15 0235 11/11/15 0625 11/11/15 1215 11/12/15 0408  HGB 13.4  --   --   --  12.1*  --   --   --   HCT 41.0  --   --   --  36.0*  --   --   --   PLT 260  --   --   --  234  --   --   --   HEPARINUNFRC  --   --   --   < >  --  0.38 0.36 0.36  CREATININE 0.88  --   --   --  0.82  --   --   --   TROPONINI  --   < > 0.05*  --  0.04*  --  0.04*  --   < > = values in this interval not displayed.  Estimated Creatinine Clearance: 130.1 mL/min (by C-G formula based on SCr of 0.82 mg/dL).   Medical History: Past Medical History:  Diagnosis Date  . Abscess    to rectum  . Diabetes mellitus (HCC)   . Hypertension     Medications:  Scheduled:  . aspirin  81 mg Oral Daily  . atorvastatin  40 mg Oral q1800  . carvedilol  3.125 mg Oral BID WC  . clindamycin (CLEOCIN) IV  600 mg Intravenous Q8H  . docusate sodium  100 mg Oral BID  . Influenza vac split quadrivalent PF  0.5 mL Intramuscular Tomorrow-1000  . insulin aspart  0-5 Units Subcutaneous QHS  . insulin aspart  0-9 Units Subcutaneous TID WC  . losartan  25 mg Oral Daily  . nicotine  21 mg Transdermal Daily  . piperacillin-tazobactam (ZOSYN)  IV  3.375 g Intravenous Q8H  . pneumococcal 23 valent vaccine  0.5 mL Intramuscular Tomorrow-1000   Infusions:  . heparin 1,550 Units/hr (11/11/15 2359)    Assessment: 40 yo male, LV Thrombus detected and stat troponin came back at 0.05 during procedure for scrotal I&D.  Pharmacy consulted to begin heparin.   Goal  of Therapy:  Heparin level 0.3-0.7 units/ml Monitor platelets by anticoagulation protocol: Yes   Plan:  10/10 0625 Heparin level =0.38. First therapeutic level.   10/10 1215 Heparin Level= 0.36. This is second therapeutic level. Continue drip at 1550 units/hr and recheck HL wiith am labs.   10/11 0500 HL=0.36 Continue drip at 1550 and recheck tomorrow AM.  Olene FlossMelissa D Maccia, PharmD Clinical Pharmacist 11/12/2015,5:05 AM

## 2015-11-12 NOTE — Care Management (Signed)
left communication for cardiology to see if CM will need to anticipate a zoll vest.

## 2015-11-12 NOTE — Progress Notes (Addendum)
Pt. Discharged to home via wc & taxi cab. Discharge instructions and medication regimen reviewed at bedside with patient. Pt. verbalizes understanding of instructions and medication regimen. Prescription for percocet included with d/c papers, all other prescriptions sent to pharmacy- pt aware. TELE and IV discontinued per policy.

## 2015-11-12 NOTE — Discharge Summary (Signed)
[employer] [facility] - [place] at [facility]   PATIENT NAME: Joshua Tate    MR#:  [id number]  DATE OF BIRTH:  1975-03-30  DATE OF ADMISSION:  11/10/2015 ADMITTING PHYSICIAN: Vanna Scotland, MD  DATE OF DISCHARGE: 11/12/15  PRIMARY CARE PHYSICIAN: No PCP Per Patient    ADMISSION DIAGNOSIS:  Abscess of scrotum [N49.2]  DISCHARGE DIAGNOSIS:  Principal Problem:   Abscess of scrotum Active Problems:   Abnormal EKG   Essential hypertension   Diabetes mellitus (HCC)   Cardiomyopathy (HCC)   Mural thrombus of cardiac apex   SECONDARY DIAGNOSIS:   Past Medical History:  Diagnosis Date  . Abscess    to rectum  . Diabetes mellitus (HCC)   . Hypertension     HOSPITAL COURSE:  Joshua Heffern  is a 40 y.o. male admitted 11/10/2015 with chief complaint Abscess . Please see H&P performed by Vanna Scotland, MD for further information. Patient presented to the hospital for drainage of scrotal abscess, he had an I&D performed 09/26/15 but left AMA afterwards. Noted abnormal ST changes which prompted cardiology evaluation.  He was found to  Have EF 20% with apical dyskinesis and hypokineses mid to distal anterolateral and anterior walls As well as possible small LV thrombus. Placed on heparin, cardiology recommended catheterization but patient wishes to defer this for outpatient. He remains afebrile with improved white count on day of discharge  DISCHARGE CONDITIONS:   stable  CONSULTS OBTAINED:  Treatment Team:  Henrene Dodge, MD Leafy Ro, MD Jerilee Field, MD Iran Ouch, MD  DRUG ALLERGIES:  No Known Allergies  DISCHARGE MEDICATIONS:   Current Discharge Medication List    START taking these medications   Details  aspirin 81 MG chewable tablet Chew 1 tablet (81 mg total) by mouth daily. Qty: 30 tablet, Refills: 0    atorvastatin (LIPITOR) 40 MG tablet Take 1 tablet (40 mg total) by mouth daily at 6 PM. Qty: 30 tablet, Refills: 0    carvedilol  (COREG) 3.125 MG tablet Take 1 tablet (3.125 mg total) by mouth 2 (two) times daily with a meal. Qty: 60 tablet, Refills: 0    clindamycin (CLEOCIN) 300 MG capsule Take 1 capsule (300 mg total) by mouth 3 (three) times daily. Qty: 21 capsule, Refills: 0    losartan (COZAAR) 25 MG tablet Take 1 tablet (25 mg total) by mouth daily. Qty: 30 tablet, Refills: 0    oxyCODONE-acetaminophen (PERCOCET/ROXICET) 5-325 MG tablet Take 1-2 tablets by mouth every 4 (four) hours as needed for moderate pain. Qty: 30 tablet, Refills: 0      CONTINUE these medications which have NOT CHANGED   Details  naproxen sodium (ANAPROX) 220 MG tablet Take 220 mg by mouth 2 (two) times daily as needed.         DISCHARGE INSTRUCTIONS:    DIET:  Cardiac diet  DISCHARGE CONDITION:  Stable  ACTIVITY:  Activity as tolerated  OXYGEN:  Home Oxygen: No.   Oxygen Delivery: room air  DISCHARGE LOCATION:  home   If you experience worsening of your admission symptoms, develop shortness of breath, life threatening emergency, suicidal or homicidal thoughts you must seek medical attention immediately by calling 911 or calling your MD immediately  if symptoms less severe.  You Must read complete instructions/literature along with all the possible adverse reactions/side effects for all the Medicines you take and that have been prescribed to you. Take any new Medicines after you have completely understood and accpet all the  possible adverse reactions/side effects.   Please note  You were cared for by a hospitalist during your hospital stay. If you have any questions about your discharge medications or the care you received while you were in the hospital after you are discharged, you can call the unit and asked to speak with the hospitalist on call if the hospitalist that took care of you is not available. Once you are discharged, your primary care physician will handle any further medical issues. Please note that NO  REFILLS for any discharge medications will be authorized once you are discharged, as it is imperative that you return to your primary care physician (or establish a relationship with a primary care physician if you do not have one) for your aftercare needs so that they can reassess your need for medications and monitor your lab values.    On the day of Discharge:   VITAL SIGNS:  Blood pressure 136/84, pulse 88, temperature 98.5 F (36.9 C), temperature source Oral, resp. rate 19, height 6' (1.829 m), weight 83.9 kg (185 lb), SpO2 100 %.  I/O:   Intake/Output Summary (Last 24 hours) at 11/12/15 1429 Last data filed at 11/12/15 1036  Gross per 24 hour  Intake              480 ml  Output             1550 ml  Net            -1070 ml    PHYSICAL EXAMINATION:  GENERAL:  40 y.o.-year-old patient lying in the bed with no acute distress.  EYES: Pupils equal, round, reactive to light and accommodation. No scleral icterus. Extraocular muscles intact.  HEENT: Head atraumatic, normocephalic. Oropharynx and nasopharynx clear.  NECK:  Supple, no jugular venous distention. No thyroid enlargement, no tenderness.  LUNGS: Normal breath sounds bilaterally, no wheezing, rales,rhonchi or crepitation. No use of accessory muscles of respiration.  CARDIOVASCULAR: S1, S2 normal. No murmurs, rubs, or gallops.  ABDOMEN: Soft, non-tender, non-distended. Bowel sounds present. No organomegaly or mass.  EXTREMITIES: No pedal edema, cyanosis, or clubbing.  NEUROLOGIC: Cranial nerves II through XII are intact. Muscle strength 5/5 in all extremities. Sensation intact. Gait not checked.  PSYCHIATRIC: The patient is alert and oriented x 3.  SKIN: No obvious rash, lesion, or ulcer.   DATA REVIEW:   CBC  Recent Labs Lab 11/12/15 0408  WBC 14.2*  HGB 11.8*  HCT 34.9*  PLT 256    Chemistries   Recent Labs Lab 11/11/15 0235  NA 139  K 4.0  CL 108  CO2 25  GLUCOSE 179*  BUN 13  CREATININE 0.82    CALCIUM 8.5*  AST 15  ALT 8*  ALKPHOS 66  BILITOT 0.5    Cardiac Enzymes  Recent Labs Lab 11/11/15 1215  TROPONINI 0.04*    Microbiology Results  Results for orders placed or performed during the hospital encounter of 11/10/15  Aerobic/Anaerobic Culture (surgical/deep wound)     Status: None   Collection Time: 11/10/15  1:06 PM  Result Value Ref Range Status   Specimen Description ABSCESS  Final   Special Requests SCROTAL  Final   Gram Stain   Final    MODERATE WBC PRESENT, PREDOMINANTLY PMN FEW GRAM POSITIVE COCCI IN CHAINS FEW GRAM POSITIVE RODS FEW GRAM NEGATIVE RODS [facility]    Culture MULTIPLE ORGANISMS PRESENT, NONE PREDOMINANT  Final   Report Status 11/12/2015 FINAL  Final  MRSA  PCR Screening     Status: None   Collection Time: 11/10/15  5:04 PM  Result Value Ref Range Status   MRSA by PCR NEGATIVE NEGATIVE Final    Comment:        The GeneXpert MRSA Assay (FDA approved for NASAL specimens only), is one component of a comprehensive MRSA colonization surveillance program. It is not intended to diagnose MRSA infection nor to guide or monitor treatment for MRSA infections.     RADIOLOGY:  No results found.   Management plans discussed with the patient, family and they are in agreement.  CODE STATUS:     Code Status Orders        Start     Ordered   11/10/15 1651  Full code  Continuous     11/10/15 1650    Code Status History    Date Active Date Inactive Code Status Order ID Comments User Context   09/26/2015  9:42 PM 09/27/2015  5:40 AM Full Code [id number]  Ricarda Frame, MD Inpatient      TOTAL TIME TAKING CARE OF THIS PATIENT: 33 minutes.    Hower,  Mardi Mainland.D on 11/12/2015 at 2:29 PM  Between 7am to 6pm - Pager - [phone]  After 6pm go to [email or web address] - Scientist, research (life sciences) [facility]  Office  [phone]  CC: Primary care physician; No PCP Per Patient

## 2015-11-12 NOTE — Care Management (Signed)
Patient for discharge home today. He denies needing any home health services to assist with wound care.  "I have done this many times before."  Will provide patient with a few abd pads. Cardiology has cleared for discharge.   Attending reports that cardiology has stated that patient does not qualify for life vest at this point in time.

## 2015-11-12 NOTE — Progress Notes (Signed)
Urology Consult Follow Up  Subjective: Patient is resting comfortably.  He states he had scrotal pain during the night and nocturia.  He states the nocturia was present before the admission and has been his baseline for several months.  WBC count continuing a downward trend to 14.2.  Afebrile with HTN through the night.  Cardiology recommending heart catheterization, but he declined due to child care issues.  Cultures are pending.   Anti-infectives: Anti-infectives    Start     Dose/Rate Route Frequency Ordered Stop   11/10/15 1900  clindamycin (CLEOCIN) IVPB 600 mg     600 mg 100 mL/hr over 30 Minutes Intravenous Every 8 hours 11/10/15 1650     11/10/15 1800  piperacillin-tazobactam (ZOSYN) IVPB 3.375 g     3.375 g 12.5 mL/hr over 240 Minutes Intravenous Every 8 hours 11/10/15 1650     11/10/15 1045  clindamycin (CLEOCIN) IVPB 600 mg     600 mg 100 mL/hr over 30 Minutes Intravenous  Once 11/10/15 1045 11/10/15 1220      Current Facility-Administered Medications  Medication Dose Route Frequency Provider Last Rate Last Dose  . acetaminophen (TYLENOL) tablet 650 mg  650 mg Oral Q4H PRN Vanna ScotlandAshley Brandon, MD      . aspirin chewable tablet 81 mg  81 mg Oral Daily Iran OuchMuhammad A Arida, MD   81 mg at 11/11/15 1028  . atorvastatin (LIPITOR) tablet 40 mg  40 mg Oral q1800 Iran OuchMuhammad A Arida, MD   40 mg at 11/11/15 1758  . carvedilol (COREG) tablet 3.125 mg  3.125 mg Oral BID WC Iran OuchMuhammad A Arida, MD   3.125 mg at 11/11/15 1649  . clindamycin (CLEOCIN) IVPB 600 mg  600 mg Intravenous Q8H Vanna ScotlandAshley Brandon, MD   600 mg at 11/12/15 0227  . diphenhydrAMINE (BENADRYL) injection 12.5 mg  12.5 mg Intravenous Q6H PRN Vanna ScotlandAshley Brandon, MD       Or  . diphenhydrAMINE (BENADRYL) 12.5 MG/5ML elixir 12.5 mg  12.5 mg Oral Q6H PRN Vanna ScotlandAshley Brandon, MD      . docusate sodium (COLACE) capsule 100 mg  100 mg Oral BID Vanna ScotlandAshley Brandon, MD   100 mg at 11/11/15 2114  . heparin ADULT infusion 100 units/mL (25000 units/22450mL sodium  chloride 0.45%)  1,550 Units/hr Intravenous Continuous Olene FlossMelissa D Maccia, RPH 15.5 mL/hr at 11/11/15 2359 1,550 Units/hr at 11/11/15 2359  . hydrALAZINE (APRESOLINE) injection 10 mg  10 mg Intravenous Q8H PRN Sondra Bargesyan M Dunn, PA-C   10 mg at 11/12/15 0011  . Influenza vac split quadrivalent PF (FLUARIX) injection 0.5 mL  0.5 mL Intramuscular Tomorrow-1000 Auburn BilberryShreyang Patel, MD      . insulin aspart (novoLOG) injection 0-5 Units  0-5 Units Subcutaneous QHS Shaune PollackQing Chen, MD   3 Units at 11/10/15 2050  . insulin aspart (novoLOG) injection 0-9 Units  0-9 Units Subcutaneous TID WC Auburn BilberryShreyang Patel, MD   2 Units at 11/11/15 1126  . losartan (COZAAR) tablet 25 mg  25 mg Oral Daily Antonieta Ibaimothy J Gollan, MD   25 mg at 11/11/15 1028  . morphine 2 MG/ML injection 2-4 mg  2-4 mg Intravenous Q2H PRN Vanna ScotlandAshley Brandon, MD   4 mg at 11/12/15 0752  . nicotine (NICODERM CQ - dosed in mg/24 hours) patch 21 mg  21 mg Transdermal Daily Wyatt Hasteavid K Hower, MD   21 mg at 11/11/15 1128  . ondansetron (ZOFRAN) injection 4 mg  4 mg Intravenous Q4H PRN Vanna ScotlandAshley Brandon, MD   4 mg at 11/11/15 2359  .  oxyCODONE-acetaminophen (PERCOCET/ROXICET) 5-325 MG per tablet 1-2 tablet  1-2 tablet Oral Q4H PRN Vanna Scotland, MD   2 tablet at 11/12/15 0531  . piperacillin-tazobactam (ZOSYN) IVPB 3.375 g  3.375 g Intravenous Q8H Vanna Scotland, MD   3.375 g at 11/12/15 0227  . pneumococcal 23 valent vaccine (PNU-IMMUNE) injection 0.5 mL  0.5 mL Intramuscular Tomorrow-1000 Auburn Bilberry, MD         Objective: Vital signs in last 24 hours: Temp:  [97.7 F (36.5 C)-98.2 F (36.8 C)] 97.8 F (36.6 C) (10/11 0535) Pulse Rate:  [82-99] 86 (10/11 0535) Resp:  [16-29] 18 (10/11 0535) BP: (132-159)/(90-111) 155/103 (10/11 0535) SpO2:  [71 %-100 %] 93 % (10/11 0535)  Intake/Output from previous day: 10/10 0701 - 10/11 0700 In: 1213.5 [P.O.:480; I.V.:633.5; IV Piggyback:100] Out: 1550 [Urine:1550] Intake/Output this shift: No intake/output data  recorded.   Physical Exam Constitutional: Well nourished. Alert and oriented, No acute distress. HEENT: [place] AT, moist mucus membranes. Trachea midline, no masses. Cardiovascular: No clubbing, cyanosis, or edema. Respiratory: Normal respiratory effort, no increased work of breathing. GI: Abdomen is soft, non tender, non distended, no abdominal masses. Liver and spleen not palpable.  No hernias appreciated.  Stool sample for occult testing is not indicated.   GU: No CVA tenderness.  No bladder fullness or masses. 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   Scrotal and perineal wounds with Penrose drain in place.  Area cleansed with Betadine.  ABD pads applied.   Mesh underwear are applied.  Abscess cavity with good granulation tissue.  No fluctuance, erythema or crepitus.  . Skin: No rashes, bruises or suspicious lesions. Lymph: No cervical or inguinal adenopathy. Neurologic: Grossly intact, no focal deficits, moving all 4 extremities. Psychiatric: Normal mood and affect.  Lab Results:   Recent Labs  11/11/15 0235 11/12/15 0408  WBC 17.8* 14.2*  HGB 12.1* 11.8*  HCT 36.0* 34.9*  PLT 234 256   BMET  Recent Labs  11/10/15 0849 11/11/15 0235  NA 143 139  K 4.0 4.0  CL 109 108  CO2 27 25  GLUCOSE 121* 179*  BUN 10 13  CREATININE 0.88 0.82  CALCIUM 8.9 8.5*   PT/INR No results for input(s): LABPROT, INR in the last 72 hours. ABG No results for input(s): PHART, HCO3 in the last 72 hours.  Invalid input(s): PCO2, PO2  Studies/Results: Ct Pelvis W Contrast  Result Date: 11/10/2015 CLINICAL DATA:  Scrotal and perineal abscess. EXAM: CT PELVIS WITH CONTRAST TECHNIQUE: Multidetector CT imaging of the pelvis was performed using the standard protocol following the bolus administration of intravenous contrast. CONTRAST:  ISOVUE-300 IOPAMIDOL (ISOVUE-300) INJECTION 61% COMPARISON:  CT scan of September 26, 2015. FINDINGS: Urinary Tract: Urinary bladder is decompressed.  Visualized portions of lower poles of both kidneys appear normal. No definite ureteral dilatation is noted. Bowel:  There is no evidence of bowel obstruction. Vascular/Lymphatic: Atherosclerosis of abdominal aorta is noted. 3 cm infrarenal abdominal aortic aneurysm is noted. Bilateral inguinal adenopathy is noted with 3.2 x 1.5 cm lymph node seen in right inguinal region which is enlarged compared to prior exam. Reproductive:  Prostate gland appears normal. Other: Large amount of inflammatory changes are noted in the left perineal region, although no large defined fluid collection is seen in this area. The abscess noted on prior exam appears to have significantly improved. However, inflammatory changes are seen involving the scrotum, with 2.3 cm rounded low density seen in the right scrotum concerning for possible abscess. Musculoskeletal:  No significant osseous abnormality is noted. IMPRESSION: 3 cm infrarenal abdominal aortic aneurysm. Recommend followup by ultrasound in 3 years. This recommendation follows ACR consensus guidelines: White Paper of the ACR Incidental Findings Committee II on Vascular Findings. J Am Coll Radiol 2013; 10:789-794. Bilateral inguinal adenopathy is noted, right greater than left, which most likely is inflammatory in origin. Extensive inflammatory changes are seen in the left perineal region, although the abscess noted on prior exam appears to be significantly improved. This is consistent with extensive cellulitis. There is noted an increased amount of inflammatory changes involving the scrotum compared to prior exam, with 2.3 cm low density seen in right scrotum concerning for possible abscess. This is new since prior exam. Electronically Signed   By: Lupita Raider, M.D.   On: 11/10/2015 09:59     s/p Procedure(s): INCISION AND DRAINAGE ABSCESS 40 y/o DM who underwent I & D of scrotal, perineal and perirectal abscess on 11/10/2015 who found to have cardiac thrombus with an EF of  20% and systolic dysfunction admitted to ICU for IV antibiotics and anticoagulation therapy.    1. Scrotal, perineal and perirectal abscess  - WBC's have declined to 14.2  - will discontinue Zosyn, continue with Clindamycin- will adjust if necessary when culture results are available  - continue dressing changes   2. Cardiac thrombus  - heart cath is recommended, but patient would like to pursue at a later date due to child care issues  - cardiology consult appreciated  3. Type 2 DM  - followed by medicine  - encouraged the patient to obtain a local PCP to help manage his diabetes  4. Nocturia  - will address on an outpatient basis    LOS: 2 days    [facility] 11/12/2015   I have seen and examined the patient, labs/ imaging reviewed and discussed his management with Michiel Cowboy.  I reviewed the PA's note and agree with the documented findings and plan of care.

## 2015-11-12 NOTE — Progress Notes (Addendum)
Anticipate discharge today: --Questions remaining --   1. Blood thinner for possible apical thrombus on dc? 2. Life vest? 3. Further cardiac medications?  Will discuss with cardiology  Patient states able to care for wounds himself at home

## 2015-11-12 NOTE — Progress Notes (Signed)
2 Days Post-Op   Subjective:  Patient reports his area of abscess feels better today than last time. No other complaints other than wanting to go home.  Vital signs in last 24 hours: Temp:  [97.8 F (36.6 C)-98.5 F (36.9 C)] 98.5 F (36.9 C) (10/11 0812) Pulse Rate:  [84-99] 84 (10/11 0812) Resp:  [16-29] 18 (10/11 0535) BP: (141-159)/(90-111) 159/103 (10/11 0812) SpO2:  [93 %-100 %] 98 % (10/11 0812) Last BM Date: 11/09/15  Intake/Output from previous day: 10/10 0701 - 10/11 0700 In: 1213.5 [P.O.:480; I.V.:633.5; IV Piggyback:100] Out: 1550 [Urine:1550]  GI: soft, non-tender; bowel sounds normal; no masses,  no organomegaly  Skin: Dressing in place to perineum and scrotal abscess site. Recently redressed the time of my exam. No evidence of spreading erythema or active purulence.  Lab Results:  CBC  Recent Labs  11/11/15 0235 11/12/15 0408  WBC 17.8* 14.2*  HGB 12.1* 11.8*  HCT 36.0* 34.9*  PLT 234 256   CMP     Component Value Date/Time   NA 139 11/11/2015 0235   NA 138 01/02/2012 0811   K 4.0 11/11/2015 0235   K 3.7 01/02/2012 0811   CL 108 11/11/2015 0235   CL 106 01/02/2012 0811   CO2 25 11/11/2015 0235   CO2 28 01/02/2012 0811   GLUCOSE 179 (H) 11/11/2015 0235   GLUCOSE 96 08/13/2013 1339   BUN 13 11/11/2015 0235   BUN 8 01/02/2012 0811   CREATININE 0.82 11/11/2015 0235   CREATININE 0.79 01/02/2012 0811   CALCIUM 8.5 (L) 11/11/2015 0235   CALCIUM 8.8 01/02/2012 0811   PROT 7.3 11/11/2015 0235   ALBUMIN 3.0 (L) 11/11/2015 0235   AST 15 11/11/2015 0235   ALT 8 (L) 11/11/2015 0235   ALKPHOS 66 11/11/2015 0235   BILITOT 0.5 11/11/2015 0235   GFRNONAA >60 11/11/2015 0235   GFRNONAA >60 01/02/2012 0811   GFRAA >60 11/11/2015 0235   GFRAA >60 01/02/2012 0811   PT/INR No results for input(s): LABPROT, INR in the last 72 hours.  Studies/Results: Ct Pelvis W Contrast  Result Date: 11/10/2015 CLINICAL DATA:  Scrotal and perineal abscess. EXAM: CT  PELVIS WITH CONTRAST TECHNIQUE: Multidetector CT imaging of the pelvis was performed using the standard protocol following the bolus administration of intravenous contrast. CONTRAST:  100mL ISOVUE-300 IOPAMIDOL (ISOVUE-300) INJECTION 61% COMPARISON:  CT scan of September 26, 2015. FINDINGS: Urinary Tract: Urinary bladder is decompressed. Visualized portions of lower poles of both kidneys appear normal. No definite ureteral dilatation is noted. Bowel:  There is no evidence of bowel obstruction. Vascular/Lymphatic: Atherosclerosis of abdominal aorta is noted. 3 cm infrarenal abdominal aortic aneurysm is noted. Bilateral inguinal adenopathy is noted with 3.2 x 1.5 cm lymph node seen in right inguinal region which is enlarged compared to prior exam. Reproductive:  Prostate gland appears normal. Other: Large amount of inflammatory changes are noted in the left perineal region, although no large defined fluid collection is seen in this area. The abscess noted on prior exam appears to have significantly improved. However, inflammatory changes are seen involving the scrotum, with 2.3 cm rounded low density seen in the right scrotum concerning for possible abscess. Musculoskeletal: No significant osseous abnormality is noted. IMPRESSION: 3 cm infrarenal abdominal aortic aneurysm. Recommend followup by ultrasound in 3 years. This recommendation follows ACR consensus guidelines: White Paper of the ACR Incidental Findings Committee II on Vascular Findings. J Am Coll Radiol 2013; 10:789-794. Bilateral inguinal adenopathy is noted, right greater than left, which  most likely is inflammatory in origin. Extensive inflammatory changes are seen in the left perineal region, although the abscess noted on prior exam appears to be significantly improved. This is consistent with extensive cellulitis. There is noted an increased amount of inflammatory changes involving the scrotum compared to prior exam, with 2.3 cm low density seen in right  scrotum concerning for possible abscess. This is new since prior exam. Electronically Signed   By: Lupita Raider, M.D.   On: 11/10/2015 09:59    Assessment/Plan: 40 year old male status post I&D of perineal abscess. Tolerated procedure well. Discussed the need for continued antibiotics and wound care with the patient. General Surgery will continue to follow.   Ricarda Frame, MD FACS General Surgeon  11/12/2015

## 2015-11-12 NOTE — Progress Notes (Signed)
Patient: Joshua HousekeeperJohn G Tate / Admit Date: 11/10/2015 / Date of Encounter: 11/12/2015, 8:06 AM   Subjective: No acute issues overnight. No chest pain or SOB. Some palpitations. Remains on heparin gtt for possible LV thrombus. Awaiting final culture results from scrotal abscess. Does not want to have cardiac cath this admission. Wants to go home first then come back for outpatient R/LHC.   Review of Systems: Review of Systems  Constitutional: Positive for malaise/fatigue. Negative for chills, diaphoresis, fever and weight loss.  HENT: Negative for congestion.   Eyes: Negative for discharge and redness.  Respiratory: Negative for cough, sputum production, shortness of breath and wheezing.   Cardiovascular: Negative for chest pain, palpitations, orthopnea, claudication, leg swelling and PND.  Gastrointestinal: Negative for abdominal pain, heartburn, nausea and vomiting.  Musculoskeletal: Negative for falls and myalgias.  Skin: Negative for rash.  Neurological: Positive for weakness. Negative for dizziness, tingling, tremors, sensory change, speech change, focal weakness and loss of consciousness.  Endo/Heme/Allergies: Does not bruise/bleed easily.  Psychiatric/Behavioral: Negative for substance abuse. The patient is not nervous/anxious.   All other systems reviewed and are negative.   Objective: Telemetry: NSR, 80's PVC's Physical Exam: Blood pressure (!) 155/103, pulse 86, temperature 97.8 F (36.6 C), temperature source Oral, resp. rate 18, height 6' (1.829 m), weight 185 lb (83.9 kg), SpO2 93 %. Body mass index is 25.09 kg/m. General: Well developed, well nourished, in no acute distress. Head: Normocephalic, atraumatic, sclera non-icteric, no xanthomas, nares are without discharge. Neck: Negative for carotid bruits. JVP not elevated. Lungs: Clear bilaterally to auscultation without wheezes, rales, or rhonchi. Breathing is unlabored. Heart: RRR S1 S2, II/VI systolic murmur, no rubs, or  gallops.  Abdomen: Soft, non-tender, non-distended with normoactive bowel sounds. No rebound/guarding. Extremities: No clubbing or cyanosis. No edema. Distal pedal pulses are 2+ and equal bilaterally. Neuro: Alert and oriented X 3. Moves all extremities spontaneously. Psych:  Responds to questions appropriately with a normal affect.   Intake/Output Summary (Last 24 hours) at 11/12/15 0806 Last data filed at 11/12/15 0534  Gross per 24 hour  Intake            988.5 ml  Output             1550 ml  Net           -561.5 ml    Inpatient Medications:  . aspirin  81 mg Oral Daily  . atorvastatin  40 mg Oral q1800  . carvedilol  3.125 mg Oral BID WC  . clindamycin (CLEOCIN) IV  600 mg Intravenous Q8H  . docusate sodium  100 mg Oral BID  . Influenza vac split quadrivalent PF  0.5 mL Intramuscular Tomorrow-1000  . insulin aspart  0-5 Units Subcutaneous QHS  . insulin aspart  0-9 Units Subcutaneous TID WC  . losartan  25 mg Oral Daily  . nicotine  21 mg Transdermal Daily  . piperacillin-tazobactam (ZOSYN)  IV  3.375 g Intravenous Q8H  . pneumococcal 23 valent vaccine  0.5 mL Intramuscular Tomorrow-1000   Infusions:  . heparin 1,550 Units/hr (11/11/15 2359)    Labs:  Recent Labs  11/10/15 0849 11/11/15 0235  NA 143 139  K 4.0 4.0  CL 109 108  CO2 27 25  GLUCOSE 121* 179*  BUN 10 13  CREATININE 0.88 0.82  CALCIUM 8.9 8.5*    Recent Labs  11/11/15 0235  AST 15  ALT 8*  ALKPHOS 66  BILITOT 0.5  PROT 7.3  ALBUMIN  3.0*    Recent Labs  11/10/15 0849 11/11/15 0235 11/12/15 0408  WBC 20.3* 17.8* 14.2*  NEUTROABS 16.1*  --   --   HGB 13.4 12.1* 11.8*  HCT 41.0 36.0* 34.9*  MCV 88.8 88.8 89.0  PLT 260 234 256    Recent Labs  11/10/15 1441 11/10/15 2027 11/11/15 0235 11/11/15 1215  TROPONINI 0.05* 0.05* 0.04* 0.04*   Invalid input(s): POCBNP  Recent Labs  11/11/15 0235  HGBA1C 5.7*     Weights: Filed Weights   11/10/15 0731  Weight: 185 lb (83.9  kg)     Radiology/Studies:  Ct Pelvis W Contrast  Result Date: 11/10/2015 CLINICAL DATA:  Scrotal and perineal abscess. EXAM: CT PELVIS WITH CONTRAST TECHNIQUE: Multidetector CT imaging of the pelvis was performed using the standard protocol following the bolus administration of intravenous contrast. CONTRAST:  ISOVUE-300 IOPAMIDOL (ISOVUE-300) INJECTION 61% COMPARISON:  CT scan of September 26, 2015. FINDINGS: Urinary Tract: Urinary bladder is decompressed. Visualized portions of lower poles of both kidneys appear normal. No definite ureteral dilatation is noted. Bowel:  There is no evidence of bowel obstruction. Vascular/Lymphatic: Atherosclerosis of abdominal aorta is noted. 3 cm infrarenal abdominal aortic aneurysm is noted. Bilateral inguinal adenopathy is noted with 3.2 x 1.5 cm lymph node seen in right inguinal region which is enlarged compared to prior exam. Reproductive:  Prostate gland appears normal. Other: Large amount of inflammatory changes are noted in the left perineal region, although no large defined fluid collection is seen in this area. The abscess noted on prior exam appears to have significantly improved. However, inflammatory changes are seen involving the scrotum, with 2.3 cm rounded low density seen in the right scrotum concerning for possible abscess. Musculoskeletal: No significant osseous abnormality is noted. IMPRESSION: 3 cm infrarenal abdominal aortic aneurysm. Recommend followup by ultrasound in 3 years. This recommendation follows ACR consensus guidelines: White Paper of the ACR Incidental Findings Committee II on Vascular Findings. J Am Coll Radiol 2013; 10:789-794. Bilateral inguinal adenopathy is noted, right greater than left, which most likely is inflammatory in origin. Extensive inflammatory changes are seen in the left perineal region, although the abscess noted on prior exam appears to be significantly improved. This is consistent with extensive cellulitis. There is  noted an increased amount of inflammatory changes involving the scrotum compared to prior exam, with 2.3 cm low density seen in right scrotum concerning for possible abscess. This is new since prior exam. Electronically Signed   By: Lupita Raider, M.D.   On: 11/10/2015 09:59     Assessment and Plan  Principal Problem:   Abscess of scrotum Active Problems:   Abnormal EKG   Cardiomyopathy (HCC)   Essential hypertension   Diabetes mellitus (HCC)   Mural thrombus of cardiac apex    1. Abnormal EKG/cardiomyopathy/elevated troponin:  -EF 20-25% with AK of the mid-apicalanterior and anterolateral myocardium and possible LV thrombus -Stable angina symptoms -He does not want cardiac cath during this admission. Wants to go home first to "tend to some affairs" -States he will come back for outpatient cardiac cath -Continue ASA, statin, beta blocker, and losartan -Consider changing to [facility] -If EF remains reduced after ischemia work up and optimal medications he will need EP evaluation for possible ICD placement  2. HTN: -Continue current medications  3. Possible LV thrombus: -Continue heparin gtt for now  4. Scrotal abscess: -Per urology/surgery   Signed, Eula Listen, PA-C [facility] Pager: [phone] 11/12/2015, 8:06 AM

## 2015-11-12 NOTE — Progress Notes (Signed)
   Our office will contact patient regarding outpatient cardiac cath as he declined this while inpatient. Given his cardiomyopathy has not been fully evaluated at this time he will not be discharged on with a LifeVest or full-dose anticoagulation given soon impending cardiac cath. ASA ok. Follow up with cardiology s/p cardiac cath as above in an effort to not delay his evaluation. Discussed with Dr. Mariah MillingGollan.

## 2015-11-13 ENCOUNTER — Telehealth: Payer: Self-pay

## 2015-11-13 NOTE — Telephone Encounter (Signed)
-----   Message from Sondra Bargesyan M Dunn, PA-C sent at 11/12/2015  2:23 PM EDT ----- Elvina SidleHey, this guy is being discharged from [facility] today. He did not want an inpatient cath. Spoke with Dr. Mariah MillingGollan about him with the plan being for us to cath him whenever it suits him. Can you call him and get this tee'd up?  Thanks,  Fiservyan

## 2015-11-13 NOTE — Telephone Encounter (Signed)
I think I am office almost all week next week. Perhaps see if Dr. Kirke CorinArida could do cath on Monday? Or other day

## 2015-11-13 NOTE — Telephone Encounter (Signed)
Spoke w/ pt.  He would like to see if he can get his cath done before his ov on 11/27/15. Advised him that I will find out what days work for Dr. Mariah MillingGollan and call him back w/ possible date(s).

## 2015-11-14 MED ORDER — ATORVASTATIN CALCIUM 40 MG PO TABS: 40.0000 mg | ORAL_TABLET | Freq: Every day | ORAL | 0 refills | Status: DC

## 2015-11-14 NOTE — Telephone Encounter (Signed)
Per Dr. Kirke CorinArida, ok to sched cath on Monday @ 11:30. Spoke w/ pt.  He is verbalizes understanding [facility]. Reviewed pre-cath instructions and pt repeats back to me. Pt was recently d/c'd from hospital, most recent labs 11/11/15.

## 2015-11-17 ENCOUNTER — Ambulatory Visit
Admission: RE | Admit: 2015-11-17 | Discharge: 2015-11-17 | Disposition: A | Discharge status: Home / Self Care | Payer: Medicaid Other | Source: Ambulatory Visit | Attending: Cardiovascular Disease | Admitting: Cardiovascular Disease

## 2015-11-17 ENCOUNTER — Encounter
Admission: RE | Disposition: A | Discharge status: Home / Self Care | Payer: Self-pay | Source: Ambulatory Visit | Attending: Cardiovascular Disease

## 2015-11-17 ENCOUNTER — Other Ambulatory Visit: Payer: Self-pay

## 2015-11-17 DIAGNOSIS — I255 Ischemic cardiomyopathy: Secondary | ICD-10-CM | POA: Insufficient documentation

## 2015-11-17 DIAGNOSIS — R778 Other specified abnormalities of plasma proteins: Secondary | ICD-10-CM | POA: Diagnosis not present

## 2015-11-17 DIAGNOSIS — I200 Unstable angina: Secondary | ICD-10-CM

## 2015-11-17 DIAGNOSIS — N492 Inflammatory disorders of scrotum: Secondary | ICD-10-CM | POA: Insufficient documentation

## 2015-11-17 DIAGNOSIS — F17290 Nicotine dependence, other tobacco product, uncomplicated: Secondary | ICD-10-CM | POA: Diagnosis not present

## 2015-11-17 DIAGNOSIS — E119 Type 2 diabetes mellitus without complications: Secondary | ICD-10-CM | POA: Insufficient documentation

## 2015-11-17 DIAGNOSIS — Z8249 Family history of ischemic heart disease and other diseases of the circulatory system: Secondary | ICD-10-CM | POA: Diagnosis not present

## 2015-11-17 DIAGNOSIS — R9431 Abnormal electrocardiogram [ECG] [EKG]: Secondary | ICD-10-CM | POA: Diagnosis present

## 2015-11-17 DIAGNOSIS — I25110 Atherosclerotic heart disease of native coronary artery with unstable angina pectoris: Secondary | ICD-10-CM | POA: Diagnosis not present

## 2015-11-17 DIAGNOSIS — I10 Essential (primary) hypertension: Secondary | ICD-10-CM | POA: Insufficient documentation

## 2015-11-17 HISTORY — PX: CARDIAC CATHETERIZATION: SHX172

## 2015-11-17 SURGERY — LEFT HEART CATH AND CORONARY ANGIOGRAPHY
Anesthesia: Moderate Sedation | Laterality: Left

## 2015-11-17 MED ORDER — HEPARIN (PORCINE) IN NACL 2-0.9 UNIT/ML-% IJ SOLN
INTRAMUSCULAR | Status: AC
  Filled 2015-11-17: qty 1000

## 2015-11-17 MED ORDER — MIDAZOLAM HCL 2 MG/2ML IJ SOLN
INTRAMUSCULAR | Status: AC
  Filled 2015-11-17: qty 2

## 2015-11-17 MED ORDER — SODIUM CHLORIDE 0.9 % IV SOLN: 250.0000 mL | INTRAVENOUS | Status: DC | PRN

## 2015-11-17 MED ORDER — FENTANYL CITRATE (PF) 100 MCG/2ML IJ SOLN
INTRAMUSCULAR | Status: AC
  Filled 2015-11-17: qty 2

## 2015-11-17 MED ORDER — HEPARIN SODIUM (PORCINE) 1000 UNIT/ML IJ SOLN
INTRAMUSCULAR | Status: DC | PRN
  Administered 2015-11-17: 4000 [IU] via INTRAVENOUS

## 2015-11-17 MED ORDER — HEPARIN SODIUM (PORCINE) 1000 UNIT/ML IJ SOLN
INTRAMUSCULAR | Status: AC
  Filled 2015-11-17: qty 1

## 2015-11-17 MED ORDER — SODIUM CHLORIDE 0.9% FLUSH: 3.0000 mL | INTRAVENOUS | Status: DC | PRN

## 2015-11-17 MED ORDER — VERAPAMIL HCL 2.5 MG/ML IV SOLN
INTRAVENOUS | Status: AC
  Filled 2015-11-17: qty 2

## 2015-11-17 MED ORDER — CARVEDILOL 3.125 MG PO TABS: 6.2500 mg | ORAL_TABLET | Freq: Two times a day (BID) | ORAL | 0 refills | Status: DC

## 2015-11-17 MED ORDER — SODIUM CHLORIDE 0.9% FLUSH: 3.0000 mL | Freq: Two times a day (BID) | INTRAVENOUS | Status: DC

## 2015-11-17 MED ORDER — SODIUM CHLORIDE FLUSH 0.9 % IV SOLN
INTRAVENOUS | Status: AC
  Filled 2015-11-17: qty 10

## 2015-11-17 MED ORDER — MIDAZOLAM HCL 2 MG/2ML IJ SOLN
INTRAMUSCULAR | Status: DC | PRN
Start: 2015-11-17 — End: 2015-11-17
  Administered 2015-11-17: 1 mg via INTRAVENOUS

## 2015-11-17 MED ORDER — LOSARTAN POTASSIUM 25 MG PO TABS: 25.0000 mg | ORAL_TABLET | Freq: Every day | ORAL | 6 refills | Status: DC

## 2015-11-17 MED ORDER — FENTANYL CITRATE (PF) 100 MCG/2ML IJ SOLN
INTRAMUSCULAR | Status: DC | PRN
  Administered 2015-11-17: 25 ug via INTRAVENOUS

## 2015-11-17 MED ORDER — VERAPAMIL HCL 2.5 MG/ML IV SOLN
INTRAVENOUS | Status: DC | PRN
  Administered 2015-11-17: 2.5 mg via INTRA_ARTERIAL

## 2015-11-17 MED ORDER — ASPIRIN 81 MG PO CHEW: 81.0000 mg | CHEWABLE_TABLET | ORAL | Status: DC

## 2015-11-17 MED ORDER — SODIUM CHLORIDE 0.9 % IV SOLN: INTRAVENOUS | Status: DC

## 2015-11-17 MED ORDER — SODIUM CHLORIDE 0.9% FLUSH
3.0000 mL | Freq: Two times a day (BID) | INTRAVENOUS | Status: DC
Start: 2015-11-17 — End: 2015-11-17

## 2015-11-17 MED ORDER — IOPAMIDOL (ISOVUE-300) INJECTION 61%
INTRAVENOUS | Status: DC | PRN
  Administered 2015-11-17: 70 mL via INTRA_ARTERIAL

## 2015-11-17 MED ORDER — FUROSEMIDE 20 MG PO TABS: 20.0000 mg | ORAL_TABLET | Freq: Every day | ORAL | 3 refills | Status: DC

## 2015-11-17 MED ORDER — SODIUM CHLORIDE 0.9 % IV SOLN
250.0000 mL | INTRAVENOUS | Status: DC | PRN
Start: 2015-11-17 — End: 2015-11-17

## 2015-11-17 SURGICAL SUPPLY — 6 items
CATH 5F 110X4 TIG (CATHETERS) ×3 IMPLANT
DEVICE RAD TR BAND REGULAR (VASCULAR PRODUCTS) ×3 IMPLANT
GLIDESHEATH SLEND SS 6F .021 (SHEATH) ×3 IMPLANT
KIT MANI 3VAL PERCEP (MISCELLANEOUS) ×3 IMPLANT
PACK CARDIAC CATH (CUSTOM PROCEDURE TRAY) ×3 IMPLANT
WIRE SAFE-T 1.5MM-J .035X260CM (WIRE) ×3 IMPLANT

## 2015-11-17 NOTE — Interval H&P Note (Signed)
Cath Lab Visit (complete for each Cath Lab visit)  Clinical Evaluation Leading to the Procedure:   ACS: Yes.    Non-ACS:    Anginal Classification: CCS IV  Anti-ischemic medical therapy: Minimal Therapy (1 class of medications)  Non-Invasive Test Results: No non-invasive testing performed  Prior CABG: No previous CABG      History and Physical Interval Note:  11/17/2015 12:00 PM  Joshua Tate  has presented today for surgery, with the diagnosis of LT Cath    Cardiomyopathy  The various methods of treatment have been discussed with the patient and family. After consideration of risks, benefits and other options for treatment, the patient has consented to  Procedure(s): Left Heart Cath and Coronary Angiography (Left) as a surgical intervention .  The patient's history has been reviewed, patient examined, no change in status, stable for surgery.  I have reviewed the patient's chart and labs.  Questions were answered to the patient's satisfaction.     Lorine BearsMuhammad Aleksander Tate

## 2015-11-17 NOTE — Progress Notes (Signed)
Spoke with Dr. Kirke CorinArida regarding patient's BP. MD is aware and declines any orders at this time.

## 2015-11-17 NOTE — H&P (View-Only) (Signed)
Cardiology Consultation Note  Patient ID: Joshua Tate, MRN: [id number], DOB/AGE: [birth year] 41 y.o. Admit date: 11/10/2015   Date of Consult: 11/10/2015 Primary Physician: No PCP Per Patient Primary Cardiologist: New to [facility] Requesting Physician: Dr. Pernell Dupre, MD  Chief Complaint: Scrotal abscess Reason for Consult: Post-operative EKG changes  HPI: 40 y.o. male with h/o recurrent perirectal and scrotal abscesses, HTN, and DM who presented to [facility] ED today for evaluation of drainage from his scrotum and perineum x 3 days. During the post-operative course there was noted EKG changes along the anterolateral leads. He was asymptomatic. Cardiology to evaluate.   He apparently has seen a cardiologist in his teenage years 2/2 murmur. At that time he reports undergoing a treadmill stress test that he self reports as normal. This was 20+ years ago he reports. He has not seen a cardiologist since then. He smokes Black and Mild cigars daily as well as smokes marijuana. He rarely drinks etoh.   He presented to the ED with a 3 day history of scrotal drainage. CT showed inflammatory changes involving the left perineum and a fluid collection within the right hemiscrotum . He underwent successful scrotal I&D. While in the PACU there was noted ST elevation on telemetry. Stat 12-lead EKG showed nonspecific anterolateral st/t changes with inferior TWI. He was completely asymptomatic in the post-operative course, denying any chest pain, SOB, or palpitations. There was no nausea, vomiting, or diaphoresis.   Upon talking with Dr. Pernell Dupre, MD I was able to find out the patient had noted some increased fatigue for a couple of weeks prior to this hospital visit along with some mild chest heaviness, felt to be in the setting of known rheumatologic illness.   Stat echo showed an EF of <20% with diffuse hypokinesis and possible . Stat troponin came back at 0.05. Patient currently resting comfortably in the PACU without  complaints.     Past Medical History:  Diagnosis Date  . Abscess    to rectum  . Diabetes mellitus (HCC)   . Hypertension       Most Recent Cardiac Studies: none   Surgical History:  Past Surgical History:  Procedure Laterality Date  . EYE SURGERY    . INCISION AND DRAINAGE ABSCESS N/A 09/26/2015   Procedure: INCISION AND DRAINAGE perirectal  ABSCESS;  Surgeon: Leafy Ro, MD;  Location: [facility] ORS;  Service: General;  Laterality: N/A;     Home Meds: Prior to Admission medications   Medication Sig Start Date End Date Taking? Authorizing Provider  naproxen sodium (ANAPROX) 220 MG tablet Take 220 mg by mouth 2 (two) times daily as needed.    Historical Provider, MD    Inpatient Medications:  . [MAR Hold] lidocaine-EPINEPHrine  10 mL Infiltration Once  . nitroGLYCERIN  1 inch Topical Q6H      Allergies: No Known Allergies  Social History   Social History  . Marital status: Single    Spouse name: N/A  . Number of children: N/A  . Years of education: N/A   Occupational History  . Not on file.   Social History Main Topics  . Smoking status: Current Every Day Smoker    Types: Cigars  . Smokeless tobacco: Never Used  . Alcohol use Yes     Comment: socially  . Drug use:     Frequency: 7.0 times per week    Types: Marijuana  . Sexual activity: Not on file   Other Topics Concern  . Not on file  Social History Narrative  . No narrative on file     Family History  Problem Relation Age of Onset  . CAD Mother     a. MI age 41  . Hypertension Mother   . Diabetes Mellitus II Mother      Review of Systems: Review of Systems  Constitutional: Positive for fever and malaise/fatigue. Negative for chills, diaphoresis and weight loss.  HENT: Negative for congestion.   Eyes: Negative for discharge and redness.  Respiratory: Negative for cough, sputum production, shortness of breath and wheezing.   Cardiovascular: Negative for chest pain, palpitations, orthopnea,  claudication, leg swelling and PND.  Gastrointestinal: Negative for abdominal pain, heartburn, nausea and vomiting.  Musculoskeletal: Negative for falls and myalgias.  Skin: Negative for rash.  Neurological: Negative for dizziness, tingling, tremors, sensory change, speech change, focal weakness, loss of consciousness and weakness.  Endo/Heme/Allergies: Does not bruise/bleed easily.  Psychiatric/Behavioral: Positive for substance abuse. The patient is not nervous/anxious.   All other systems reviewed and are negative.   Labs: No results for input(s): CKTOTAL, CKMB, TROPONINI in the last 72 hours. Lab Results  Component Value Date   WBC 20.3 (H) 11/10/2015   HGB 13.4 11/10/2015   HCT 41.0 11/10/2015   MCV 88.8 11/10/2015   PLT 260 11/10/2015     Recent Labs Lab 11/10/15 0849  NA 143  K 4.0  CL 109  CO2 27  BUN 10  CREATININE 0.88  CALCIUM 8.9  GLUCOSE 121*   No results found for: CHOL, HDL, LDLCALC, TRIG No results found for: DDIMER  Radiology/Studies:  Ct Pelvis W Contrast  Result Date: 11/10/2015 CLINICAL DATA:  Scrotal and perineal abscess. EXAM: CT PELVIS WITH CONTRAST TECHNIQUE: Multidetector CT imaging of the pelvis was performed using the standard protocol following the bolus administration of intravenous contrast. CONTRAST:  100mL ISOVUE-300 IOPAMIDOL (ISOVUE-300) INJECTION 61% COMPARISON:  CT scan of September 26, 2015. FINDINGS: Urinary Tract: Urinary bladder is decompressed. Visualized portions of lower poles of both kidneys appear normal. No definite ureteral dilatation is noted. Bowel:  There is no evidence of bowel obstruction. Vascular/Lymphatic: Atherosclerosis of abdominal aorta is noted. 3 cm infrarenal abdominal aortic aneurysm is noted. Bilateral inguinal adenopathy is noted with 3.2 x 1.5 cm lymph node seen in right inguinal region which is enlarged compared to prior exam. Reproductive:  Prostate gland appears normal. Other: Large amount of inflammatory changes  are noted in the left perineal region, although no large defined fluid collection is seen in this area. The abscess noted on prior exam appears to have significantly improved. However, inflammatory changes are seen involving the scrotum, with 2.3 cm rounded low density seen in the right scrotum concerning for possible abscess. Musculoskeletal: No significant osseous abnormality is noted. IMPRESSION: 3 cm infrarenal abdominal aortic aneurysm. Recommend followup by ultrasound in 3 years. This recommendation follows ACR consensus guidelines: White Paper of the ACR Incidental Findings Committee II on Vascular Findings. J Am Coll Radiol 2013; 10:789-794. Bilateral inguinal adenopathy is noted, right greater than left, which most likely is inflammatory in origin. Extensive inflammatory changes are seen in the left perineal region, although the abscess noted on prior exam appears to be significantly improved. This is consistent with extensive cellulitis. There is noted an increased amount of inflammatory changes involving the scrotum compared to prior exam, with 2.3 cm low density seen in right scrotum concerning for possible abscess. This is new since prior exam. Electronically Signed   By: Lupita RaiderJames  Green Jr, M.D.  On: 11/10/2015 09:59    EKG: Interpreted by me showed: NSR, 84 bpm, nonspecific anterolateral st elevation possibly consistent with LVH/early repolarization,TWI leads I, II, aVL Telemetry: Interpreted by me showed: NSR, 70's bpm  Weights: Filed Weights   11/10/15 0731  Weight: 185 lb (83.9 kg)     Physical Exam: Blood pressure (!) 138/101, pulse 77, temperature 99 F (37.2 C), resp. rate 20, height 6' (1.829 m), weight 185 lb (83.9 kg), SpO2 99 %. Body mass index is 25.09 kg/m. General: Well developed, well nourished, in no acute distress. Head: Normocephalic, atraumatic, sclera non-icteric, no xanthomas, nares are without discharge.  Neck: Negative for carotid bruits. JVD not  elevated. Lungs: Clear bilaterally to auscultation without wheezes, rales, or rhonchi. Breathing is unlabored. Heart: RRR with S1 S2. II/VI systolic murmur, no rubs, or gallops appreciated. Abdomen: Soft, non-tender, non-distended with normoactive bowel sounds. No hepatomegaly. No rebound/guarding. No obvious abdominal masses. Msk:  Strength and tone appear normal for age. Extremities: No clubbing or cyanosis. No edema. Distal pedal pulses are 2+ and equal bilaterally. Neuro: Alert when spoken to and oriented X 3. No facial asymmetry. No focal deficit. Moves all extremities spontaneously. Psych:  Responds to questions appropriately with a normal affect.    Assessment and Plan:  Principal Problem:   Scrotal abscess Active Problems:   Abnormal EKG   Cardiomyopathy (HCC)   Essential hypertension   Diabetes mellitus (HCC)    1. Abnormal EKG/cardiomyopathy/elevated troponin: -Stat echo with preliminary read of 20-25% with AK of the mid-apicalanterior and anterolateral myocardium and possible LV thrombus -Stat troponin 0.05-->continue to cycle -Cardiomyopathy felt to be likely chronic -Discussed with surgery, ok for heparin gtt given the above -Currently without chest pain or SOB -Nitro paste in place, continue -Has received ASA 324 mg prior to cardiology arriving -Continue ASA -Labetalol as below -Case discussed with Dr. Kirke Corin MD over the phone -Dr. Jari Sportsman review of echo as above -Cannot rule out possible prior NICM that is just now being found on imaging  -Add fasting lipid panel and A1C for further risk stratification  -Case discussed with IM and patient to be admitted under IM service with Urology and Cardiology consulted   2. Accelerated HTN: -PRN labetalol  -Add prn hydralazine   3. Scrotal abscess: -Per Urology   4. DM: -Per IM  5. Possible LV thrombus: -Likely in the setting of his cardiomyopathy -Heparin gtt as above   Signed, Eula Listen, PA-C [facility] Pager: [phone] 11/10/2015, 3:15 PM

## 2015-11-18 ENCOUNTER — Telehealth: Payer: Self-pay

## 2015-11-18 NOTE — Telephone Encounter (Signed)
Spoke with Dariece with [employer] regarding a Prior authorization that is phoned in for Losartan Potassium to [employer] at [phone]. PA is pending for up to 24 hours with PA # of [id number].

## 2015-11-20 ENCOUNTER — Ambulatory Visit: Payer: Self-pay | Admitting: Urology

## 2015-11-21 NOTE — Telephone Encounter (Signed)
Spoke with Joshua Tate regarding the PA for Losartan Potassium which has been approved for 1 year from 11-13-2015 to 11-12-2016. Patient is aware the Losartan Potassium is approved.  [name]

## 2015-11-24 ENCOUNTER — Encounter: Payer: Self-pay | Admitting: Urology

## 2015-11-24 ENCOUNTER — Ambulatory Visit (INDEPENDENT_AMBULATORY_CARE_PROVIDER_SITE_OTHER): Payer: Medicaid Other | Admitting: Urology

## 2015-11-24 VITALS — BP 158/106 | HR 88 | Ht 72.0 in | Wt 178.2 lb

## 2015-11-24 DIAGNOSIS — N492 Inflammatory disorders of scrotum: Secondary | ICD-10-CM

## 2015-11-24 DIAGNOSIS — L02215 Cutaneous abscess of perineum: Secondary | ICD-10-CM

## 2015-11-24 MED ORDER — CLINDAMYCIN HCL 300 MG PO CAPS: 300.0000 mg | ORAL_CAPSULE | Freq: Three times a day (TID) | ORAL | 0 refills | Status: DC

## 2015-11-24 MED ORDER — OXYCODONE-ACETAMINOPHEN 10-325 MG PO TABS: 1.0000 | ORAL_TABLET | ORAL | 0 refills | Status: DC | PRN

## 2015-11-24 NOTE — Progress Notes (Signed)
11/24/2015 5:04 PM   Joshua Tate Joshua Tate 11/11/1975 [id number]  Referring provider: No referring provider defined for this encounter.  Chief Complaint  Patient presents with  . New Patient (Initial Visit)    referred for 1 week wound check by Dr. Apolinar Junes whom did scrotal abscess surgery    HPI: Patient is a 40 year old African American male who is s/p I & D of a scrotal and perineal abscess on 11/10/2015 by Dr. Apolinar Junes and Dr. Aleen Campi.  He presents today for a follow up visit and a rechecking of his wounds.  He states that he has had some more boils open and drain around his anus over the last few days.  He also found the scrotal penrose drain irritating, so he cut the sutures and removed the tube.    He has not had fevers, chills, nausea and vomiting.    He did undergo heart cath on 11/17/2015 and the findings were for    Dist LAD lesion, 100 %stenosed.  Mid LAD to Dist LAD lesion, 30 %stenosed.  Dist Cx lesion, 40 %stenosed.  Ost 2nd Diag to 2nd Diag lesion, 100 %stenosed.  Ost 1st Mrg to 1st Mrg lesion, 70 %stenosed.  LV end diastolic pressure is moderately elevated.   1. Severe one-vessel coronary artery disease with occluded distal LAD with right-to-left collaterals from the right coronary artery. Also occluded second diagonal with faint collaterals. Borderline disease in OM1.  2. Moderately elevated left ventricular end-diastolic pressure. Left ventricular angiography was not diagnostic and I elected not to do power injection given suspected LV thrombus.     PMH: Past Medical History:  Diagnosis Date  . Abscess    to rectum  . Diabetes mellitus (HCC)   . Hypertension     Surgical History: Past Surgical History:  Procedure Laterality Date  . CARDIAC CATHETERIZATION Left 11/17/2015   Procedure: Left Heart Cath and Coronary Angiography;  Surgeon: Iran Ouch, MD;  Location: [facility] INVASIVE CV LAB;  Service: Cardiovascular;  Laterality: Left;  . EYE  SURGERY    . INCISION AND DRAINAGE ABSCESS N/A 09/26/2015   Procedure: INCISION AND DRAINAGE perirectal  ABSCESS;  Surgeon: Leafy Ro, MD;  Location: [facility] ORS;  Service: General;  Laterality: N/A;  . INCISION AND DRAINAGE ABSCESS N/A 11/10/2015   Procedure: INCISION AND DRAINAGE ABSCESS;  Surgeon: Vanna Scotland, MD;  Location: [facility] ORS;  Service: Urology;  Laterality: N/A;    Home Medications:    Medication List       Accurate as of 11/24/15  5:04 PM. Always use your most recent med list.          aspirin 81 MG chewable tablet Chew 1 tablet (81 mg total) by mouth daily.   atorvastatin 40 MG tablet Commonly known as:  LIPITOR Take 1 tablet (40 mg total) by mouth daily at 6 PM.   carvedilol 3.125 MG tablet Commonly known as:  COREG Take 2 tablets (6.25 mg total) by mouth 2 (two) times daily with a meal.   clindamycin 300 MG capsule Commonly known as:  CLEOCIN Take 1 capsule (300 mg total) by mouth 3 (three) times daily.   furosemide 20 MG tablet Commonly known as:  LASIX Take 1 tablet (20 mg total) by mouth daily.   losartan 25 MG tablet Commonly known as:  COZAAR Take 1 tablet (25 mg total) by mouth daily.   naproxen sodium 220 MG tablet Commonly known as:  ANAPROX Take 220 mg by mouth  2 (two) times daily as needed.   oxyCODONE-acetaminophen 5-325 MG tablet Commonly known as:  PERCOCET/ROXICET Take 1-2 tablets by mouth every 4 (four) hours as needed for moderate pain.       Allergies:  Allergies  Allergen Reactions  . Aspirin     Other reaction(s): Other (See Comments)  . Latex Rash    Family History: Family History  Problem Relation Age of Onset  . CAD Mother     a. MI age 80  . Hypertension Mother   . Diabetes Mellitus II Mother   . Lung cancer Maternal Grandmother   . Hematuria Neg Hx     Social History:  reports that he has been smoking Cigars.  He has never used smokeless tobacco. He reports that he drinks alcohol. He reports that he uses  drugs, including Marijuana, about 7 times per week.  ROS: UROLOGY Frequent Urination?: Yes Hard to postpone urination?: Yes Burning/pain with urination?: No Get up at night to urinate?: Yes Leakage of urine?: No Urine stream starts and stops?: Yes Trouble starting stream?: Yes Do you have to strain to urinate?: No Blood in urine?: Yes Urinary tract infection?: Yes Sexually transmitted disease?: Yes Injury to kidneys or bladder?: No Painful intercourse?: No Weak stream?: No Erection problems?: Yes Penile pain?: No  Gastrointestinal Nausea?: No Vomiting?: No Indigestion/heartburn?: Yes Diarrhea?: Yes Constipation?: No  Constitutional Fever: Yes Night sweats?: Yes Weight loss?: Yes Fatigue?: Yes  Skin Skin rash/lesions?: Yes Itching?: Yes  Eyes Blurred vision?: Yes Double vision?: No  Ears/Nose/Throat Sore throat?: No Sinus problems?: Yes  Hematologic/Lymphatic Swollen glands?: No Easy bruising?: Yes  Cardiovascular Leg swelling?: No Chest pain?: Yes  Respiratory Cough?: Yes Shortness of breath?: Yes  Endocrine Excessive thirst?: Yes  Musculoskeletal Back pain?: Yes Joint pain?: Yes  Neurological Headaches?: Yes Dizziness?: Yes  Psychologic Depression?: Yes Anxiety?: Yes  Physical Exam: BP (!) 158/106   Pulse 88   Ht 6' (1.829 m)   Wt 178 lb 3.2 oz (80.8 kg)   BMI 24.17 kg/m   Constitutional: Well nourished. Alert and oriented, No acute distress. HEENT: [place] AT, moist mucus membranes. Trachea midline, no masses. Cardiovascular: No clubbing, cyanosis, or edema. Respiratory: Normal respiratory effort, no increased work of breathing. GI: Abdomen is soft, non tender, non distended, no abdominal masses. Liver and spleen not palpable.  No hernias appreciated.  Stool sample for occult testing is not indicated.   GU: No CVA tenderness.  No bladder fullness or masses.  Patient with circumcised phallus.   Urethral meatus is patent.  No penile  discharge. No penile lesions or rashes. Scrotum wound with granulation tissue.  No erythema.  No fluctuance or crepitus noted.  No pus expressed with palpation.   Testicles are located scrotally bilaterally. No masses are appreciated in the testicles. Left and right epididymis are normal. Rectal: Patient with  normal sphincter tone. Anus and perineum without scarring or rashes. No rectal masses are appreciated. No fluctuance or crepitus noted in the rectum.  Penrose tube in place.  A scant amount of pus is expressed from an area above the Penrose drain.  Areas of tenderness around the left perianal area without fluctuance or crepitus.  No purulent drainage.  No erythema.  Prostate is approximately 45 grams, no nodules are appreciated. Seminal vesicles are normal. Skin: No rashes, bruises or suspicious lesions. Lymph: No cervical or inguinal adenopathy. Neurologic: Grossly intact, no focal deficits, moving all 4 extremities. Psychiatric: Normal mood and affect.  Laboratory Data: Lab  Results  Component Value Date   WBC 14.2 (H) 11/12/2015   HGB 11.8 (L) 11/12/2015   HCT 34.9 (L) 11/12/2015   MCV 89.0 11/12/2015   PLT 256 11/12/2015    Lab Results  Component Value Date   CREATININE 0.82 11/11/2015    Lab Results  Component Value Date   HGBA1C 5.7 (H) 11/11/2015       Component Value Date/Time   CHOL 104 11/11/2015 0235   HDL 42 11/11/2015 0235   CHOLHDL 2.5 11/11/2015 0235   VLDL 8 11/11/2015 0235   LDLCALC 54 11/11/2015 0235    Lab Results  Component Value Date   AST 15 11/11/2015   Lab Results  Component Value Date   ALT 8 (L) 11/11/2015     Assessment & Plan:    1. Scrotal abscess  - wound with granulation tissue  - no purulent drainage, erythema, fluctuance or crepitus  - RTC in one week for wound recheck  - Discussed the warning signs with the patient and advised him to seek care in the ED if he experiences uncontrollable pain, swelling, fevers or chills  2.  Perianal abscess  - no fluctuance, crepitus  - spoke with Dr. Aleen CampiPiscoya - recommends follow up in his office for wound recheck, possible I & D and/or removal of Penrose drain   Return in about 1 week (around 12/01/2015).  These notes generated with voice recognition software. I apologize for typographical errors.  Michiel CowboySHANNON Armanie Ullmer, PA-C  [facility] [address], [address] [place], [place] [address] [phone]

## 2015-11-27 ENCOUNTER — Encounter: Payer: Self-pay | Admitting: Cardiovascular Disease

## 2015-11-27 ENCOUNTER — Ambulatory Visit (INDEPENDENT_AMBULATORY_CARE_PROVIDER_SITE_OTHER): Payer: Medicaid Other | Admitting: Cardiovascular Disease

## 2015-11-27 VITALS — BP 148/102 | HR 81 | Ht 72.0 in | Wt 177.2 lb

## 2015-11-27 DIAGNOSIS — I255 Ischemic cardiomyopathy: Secondary | ICD-10-CM | POA: Diagnosis not present

## 2015-11-27 DIAGNOSIS — N492 Inflammatory disorders of scrotum: Secondary | ICD-10-CM | POA: Diagnosis not present

## 2015-11-27 DIAGNOSIS — I10 Essential (primary) hypertension: Secondary | ICD-10-CM

## 2015-11-27 DIAGNOSIS — E1159 Type 2 diabetes mellitus with other circulatory complications: Secondary | ICD-10-CM | POA: Diagnosis not present

## 2015-11-27 DIAGNOSIS — K611 Rectal abscess: Secondary | ICD-10-CM

## 2015-11-27 MED ORDER — CARVEDILOL 6.25 MG PO TABS: 6.2500 mg | ORAL_TABLET | Freq: Two times a day (BID) | ORAL | 11 refills | Status: DC

## 2015-11-27 MED ORDER — FUROSEMIDE 20 MG PO TABS: 20.0000 mg | ORAL_TABLET | Freq: Two times a day (BID) | ORAL | 6 refills | Status: DC | PRN

## 2015-11-27 MED ORDER — LOSARTAN POTASSIUM 50 MG PO TABS: 50.0000 mg | ORAL_TABLET | Freq: Every day | ORAL | 11 refills | Status: DC

## 2015-11-27 NOTE — Patient Instructions (Addendum)
Medication Instructions:   Please increase the coreg up to 6.25 mg twice a day Please increase the losartan up to 50 mg daily  Stay on lipitor/atorvastatin, aspirin  Take lasix/furosemide for shortness of breath (diuretic)  Labwork:  No new labs needed  Testing/Procedures:  No further testing at this time   Follow-Up: It was a pleasure seeing you in the office today. Please call us if you have new issues that need to be addressed before your next appt.  [phone]  Your physician wants you to follow-up in: 3 months.  You will receive a reminder letter in the mail two months in advance. If you don't receive a letter, please call our office to schedule the follow-up appointment.  If you need a refill on your cardiac medications before your next appointment, please call your pharmacy.

## 2015-11-27 NOTE — Progress Notes (Signed)
Cardiology Office Note  Date:  11/27/2015   ID:  SHEAMUS HASTING, DOB 05-07-1975, MRN [id number]  PCP:  [facility]   Chief Complaint  Patient presents with  . other    [facility] ER Follow-up. Doing well.    HPI:  40 y.o. male with h/o recurrent perirectal and scrotal abscesses, HTN, and DM who presented to [facility] ED 11/10/2015 for evaluation of drainage from his scrotum and perineum, post-operative course  noted EKG changes along the anterolateral leads, Echocardiogram showing ejection fraction 20% with regions of wall motion abnormality,  patient had noted some increased fatigue. Follow-up cardiac catheterization10/16/2017, dr. Shirlee Limerick LAD lesion, 100 %stenosed.  Mid LAD to Dist LAD lesion, 30 %stenosed.  Dist Cx lesion, 40 %stenosed.  Ost 2nd Diag to 2nd Diag lesion, 100 %stenosed.  Ost 1st Mrg to 1st Mrg lesion, 70 %stenosed.  LV end diastolic pressure is moderately elevated.  Medical management recommended for occluded distal LAD with right-to-left collaterals from the right coronary artery. Also occluded second diagonal with faint collaterals. Borderline disease in OM1.  He presents today for follow-up of his ischemic cardiomyopathy  Overall reports feeling fine, some shortness of breath Did not take his medications today Has not started Lasix Reports he is taking Coreg 3.125 mg twice a day, losartan 25 mg daily, Lipitor, aspirin (takes BC powders)  Takes care of his 1-year-old child, shortness of breath on heavy exertion Denies any leg swelling, no PND orthopnea He reports that he continues to have perirectal abscess, scheduled for follow-up with urology. Continues on clindamycin  EKG on today's visit shows normal sinus rhythm with rate 81 bpm, T-wave abnormality lateral leads, inferior leads  PMH:   has a past medical history of Abscess; Diabetes mellitus (HCC); and Hypertension.  PSH:    Past Surgical History:  Procedure Laterality Date  . CARDIAC  CATHETERIZATION Left 11/17/2015   Procedure: Left Heart Cath and Coronary Angiography;  Surgeon: Iran Ouch, MD;  Location: [facility] INVASIVE CV LAB;  Service: Cardiovascular;  Laterality: Left;  . EYE SURGERY    . INCISION AND DRAINAGE ABSCESS N/A 09/26/2015   Procedure: INCISION AND DRAINAGE perirectal  ABSCESS;  Surgeon: Leafy Ro, MD;  Location: [facility] ORS;  Service: General;  Laterality: N/A;  . INCISION AND DRAINAGE ABSCESS N/A 11/10/2015   Procedure: INCISION AND DRAINAGE ABSCESS;  Surgeon: Vanna Scotland, MD;  Location: [facility] ORS;  Service: Urology;  Laterality: N/A;    Current Outpatient Prescriptions  Medication Sig Dispense Refill  . aspirin 81 MG chewable tablet Chew 1 tablet (81 mg total) by mouth daily. 30 tablet 0  . atorvastatin (LIPITOR) 40 MG tablet Take 1 tablet (40 mg total) by mouth daily at 6 PM. 30 tablet 0  . carvedilol (COREG) 6.25 MG tablet Take 1 tablet (6.25 mg total) by mouth 2 (two) times daily with a meal. 60 tablet 11  . clindamycin (CLEOCIN) 300 MG capsule Take 1 capsule (300 mg total) by mouth 3 (three) times daily. 30 capsule 0  . losartan (COZAAR) 50 MG tablet Take 1 tablet (50 mg total) by mouth daily. 30 tablet 11  . naproxen sodium (ANAPROX) 220 MG tablet Take 220 mg by mouth 2 (two) times daily as needed.    . furosemide (LASIX) 20 MG tablet Take 1 tablet (20 mg total) by mouth 2 (two) times daily as needed. 60 tablet 6   No current facility-administered medications for this visit.      Allergies:  Aspirin and Latex   Social History:  The patient  reports that he has been smoking Cigars.  He has never used smokeless tobacco. He reports that he drinks alcohol. He reports that he uses drugs, including Marijuana, about 7 times per week.   Family History:   family history includes CAD in his mother; Diabetes Mellitus II in his mother; Hypertension in his mother; Lung cancer in his maternal grandmother.    Review of Systems: Review of Systems   Constitutional: Negative.   Respiratory: Positive for shortness of breath.   Cardiovascular: Negative.   Gastrointestinal: Negative.   Genitourinary:       Pain from perirectal abscess  Musculoskeletal: Negative.   Neurological: Negative.   Psychiatric/Behavioral: Negative.   All other systems reviewed and are negative.    PHYSICAL EXAM: VS:  BP (!) 148/102 (BP Location: Left Arm, Patient Position: Sitting, Cuff Size: Normal)   Pulse 81   Ht 6' (1.829 m)   Wt 177 lb 4 oz (80.4 kg)   BMI 24.04 kg/m  , BMI Body mass index is 24.04 kg/m. GEN: Well nourished, well developed, in no acute distress  HEENT: normal  Neck: no JVD, carotid bruits, or masses Cardiac: RRR; no murmurs, rubs, or gallops,no edema  Respiratory:  clear to auscultation bilaterally, normal work of breathing GI: soft, nontender, nondistended, + BS MS: no deformity or atrophy  Skin: warm and dry, no rash Neuro:  Strength and sensation are intact  Psych: euthymic mood, full affect    Recent Labs: 11/11/2015: ALT 8; BUN 13; Creatinine, Ser 0.82; Potassium 4.0; Sodium 139 11/12/2015: Hemoglobin 11.8; Platelets 256    Lipid Panel Lab Results  Component Value Date   CHOL 104 11/11/2015   HDL 42 11/11/2015   LDLCALC 54 11/11/2015   TRIG 38 11/11/2015      Wt Readings from Last 3 Encounters:  11/27/15 177 lb 4 oz (80.4 kg)  11/24/15 178 lb 3.2 oz (80.8 kg)  11/17/15 185 lb (83.9 kg)       ASSESSMENT AND PLAN:  Ischemic cardiomyopathy - Plan: EKG 12-Lead Recent catheterization, medical management recommended Recommended he start Lasix for shortness of breath We will increase the losartan, carvedilol  Hypertension, unspecified type - Plan: EKG 12-Lead Blood pressure elevated Unclear if he is Taking his medications. He reports that he is taking medications just did not take them today. Recommended he increase carvedilol up to 6.25 mill grams twice a day, losartan up to 50 mg daily.  Type 2  diabetes mellitus with other circulatory complication, without long-term current use of insulin (HCC)  A1C 5.7  Abscess of scrotum  Perirectal abscess He has followup with urology   Total encounter time more than 25 minutes  Greater than 50% was spent in counseling and coordination of care with the patient    Disposition:   F/U  6 months   Orders Placed This Encounter  Procedures  . EKG 12-Lead     Signed, Dossie Arbourim Jadien Lehigh, M.D., Ph.D. 11/27/2015  [facility], [place] [phone]

## 2015-12-01 ENCOUNTER — Ambulatory Visit: Payer: Medicaid Other | Admitting: Urology

## 2015-12-03 ENCOUNTER — Encounter: Payer: Self-pay | Admitting: Emergency Medicine

## 2015-12-03 ENCOUNTER — Emergency Department: Payer: Medicaid Other

## 2015-12-03 ENCOUNTER — Inpatient Hospital Stay
Admission: EM | Admit: 2015-12-03 | Discharge: 2015-12-04 | DRG: 699 | Disposition: A | Discharge status: Home / Self Care | Payer: Medicaid Other | Attending: Internal Medicine | Admitting: Internal Medicine

## 2015-12-03 DIAGNOSIS — Z7982 Long term (current) use of aspirin: Secondary | ICD-10-CM | POA: Diagnosis not present

## 2015-12-03 DIAGNOSIS — T501X6A Underdosing of loop [high-ceiling] diuretics, initial encounter: Secondary | ICD-10-CM | POA: Diagnosis present

## 2015-12-03 DIAGNOSIS — Z79899 Other long term (current) drug therapy: Secondary | ICD-10-CM

## 2015-12-03 DIAGNOSIS — Z886 Allergy status to analgesic agent status: Secondary | ICD-10-CM

## 2015-12-03 DIAGNOSIS — I110 Hypertensive heart disease with heart failure: Secondary | ICD-10-CM | POA: Diagnosis present

## 2015-12-03 DIAGNOSIS — E785 Hyperlipidemia, unspecified: Secondary | ICD-10-CM | POA: Diagnosis present

## 2015-12-03 DIAGNOSIS — I10 Essential (primary) hypertension: Secondary | ICD-10-CM | POA: Diagnosis present

## 2015-12-03 DIAGNOSIS — I513 Intracardiac thrombosis, not elsewhere classified: Secondary | ICD-10-CM | POA: Diagnosis not present

## 2015-12-03 DIAGNOSIS — Z833 Family history of diabetes mellitus: Secondary | ICD-10-CM

## 2015-12-03 DIAGNOSIS — I714 Abdominal aortic aneurysm, without rupture: Secondary | ICD-10-CM | POA: Diagnosis present

## 2015-12-03 DIAGNOSIS — F17200 Nicotine dependence, unspecified, uncomplicated: Secondary | ICD-10-CM | POA: Diagnosis present

## 2015-12-03 DIAGNOSIS — N280 Ischemia and infarction of kidney: Principal | ICD-10-CM

## 2015-12-03 DIAGNOSIS — I255 Ischemic cardiomyopathy: Secondary | ICD-10-CM

## 2015-12-03 DIAGNOSIS — I2510 Atherosclerotic heart disease of native coronary artery without angina pectoris: Secondary | ICD-10-CM | POA: Diagnosis present

## 2015-12-03 DIAGNOSIS — Z8249 Family history of ischemic heart disease and other diseases of the circulatory system: Secondary | ICD-10-CM

## 2015-12-03 DIAGNOSIS — I248 Other forms of acute ischemic heart disease: Secondary | ICD-10-CM | POA: Diagnosis present

## 2015-12-03 DIAGNOSIS — R197 Diarrhea, unspecified: Secondary | ICD-10-CM

## 2015-12-03 DIAGNOSIS — E119 Type 2 diabetes mellitus without complications: Secondary | ICD-10-CM | POA: Diagnosis present

## 2015-12-03 DIAGNOSIS — R109 Unspecified abdominal pain: Secondary | ICD-10-CM

## 2015-12-03 DIAGNOSIS — I5022 Chronic systolic (congestive) heart failure: Secondary | ICD-10-CM | POA: Diagnosis present

## 2015-12-03 DIAGNOSIS — N492 Inflammatory disorders of scrotum: Secondary | ICD-10-CM | POA: Diagnosis present

## 2015-12-03 DIAGNOSIS — I429 Cardiomyopathy, unspecified: Secondary | ICD-10-CM

## 2015-12-03 DIAGNOSIS — R112 Nausea with vomiting, unspecified: Secondary | ICD-10-CM

## 2015-12-03 LAB — URINALYSIS COMPLETE WITH MICROSCOPIC (ARMC ONLY)
BACTERIA UA: NONE SEEN
BILIRUBIN URINE: NEGATIVE
GLUCOSE, UA: 50 mg/dL — AB
Leukocytes, UA: NEGATIVE
NITRITE: NEGATIVE
Protein, ur: 100 mg/dL — AB
Specific Gravity, Urine: 1.025 (ref 1.005–1.030)
Squamous Epithelial / LPF: NONE SEEN
pH: 8 (ref 5.0–8.0)

## 2015-12-03 LAB — CBC WITH DIFFERENTIAL/PLATELET
Basophils Absolute: 0.1 10*3/uL (ref 0–0.1)
Basophils Relative: 1 %
Eosinophils Absolute: 0.1 10*3/uL (ref 0–0.7)
Eosinophils Relative: 1 %
HEMATOCRIT: 43 % (ref 40.0–52.0)
HEMOGLOBIN: 14.1 g/dL (ref 13.0–18.0)
LYMPHS ABS: 3.1 10*3/uL (ref 1.0–3.6)
LYMPHS PCT: 25 %
MCH: 29.4 pg (ref 26.0–34.0)
MCHC: 32.7 g/dL (ref 32.0–36.0)
MCV: 89.9 fL (ref 80.0–100.0)
MONO ABS: 0.9 10*3/uL (ref 0.2–1.0)
MONOS PCT: 7 %
NEUTROS ABS: 8.4 10*3/uL — AB (ref 1.4–6.5)
NEUTROS PCT: 66 %
Platelets: 248 10*3/uL (ref 150–440)
RBC: 4.78 MIL/uL (ref 4.40–5.90)
RDW: 16.5 % — AB (ref 11.5–14.5)
WBC: 12.7 10*3/uL — ABNORMAL HIGH (ref 3.8–10.6)

## 2015-12-03 LAB — COMPREHENSIVE METABOLIC PANEL
ALK PHOS: 90 U/L (ref 38–126)
ALT: 19 U/L (ref 17–63)
ANION GAP: 10 (ref 5–15)
AST: 24 U/L (ref 15–41)
Albumin: 4 g/dL (ref 3.5–5.0)
BILIRUBIN TOTAL: 0.9 mg/dL (ref 0.3–1.2)
BUN: 9 mg/dL (ref 6–20)
CALCIUM: 9.4 mg/dL (ref 8.9–10.3)
CO2: 25 mmol/L (ref 22–32)
Chloride: 104 mmol/L (ref 101–111)
Creatinine, Ser: 0.97 mg/dL (ref 0.61–1.24)
GFR calc Af Amer: >60 mL/min (ref >60)
GLUCOSE: 153 mg/dL — AB (ref 65–99)
POTASSIUM: 4 mmol/L (ref 3.5–5.1)
Sodium: 139 mmol/L (ref 135–145)
TOTAL PROTEIN: 8.7 g/dL — AB (ref 6.5–8.1)

## 2015-12-03 LAB — TROPONIN I
TROPONIN I: 0.05 ng/mL — AB (ref <0.03)
TROPONIN I: 0.05 ng/mL — AB (ref <0.03)

## 2015-12-03 LAB — LACTIC ACID, PLASMA: Lactic Acid, Venous: 1.3 mmol/L (ref 0.5–1.9)

## 2015-12-03 LAB — APTT: APTT: 35 s (ref 24–36)

## 2015-12-03 LAB — HEPARIN LEVEL (UNFRACTIONATED): Heparin Unfractionated: 0.33 IU/mL (ref 0.30–0.70)

## 2015-12-03 LAB — PROTIME-INR
INR: 1.11
Prothrombin Time: 14.4 seconds (ref 11.4–15.2)

## 2015-12-03 LAB — LIPASE, BLOOD: LIPASE: 18 U/L (ref 11–51)

## 2015-12-03 MED ORDER — HYDRALAZINE HCL 20 MG/ML IJ SOLN
10.0000 mg | Freq: Four times a day (QID) | INTRAMUSCULAR | Status: DC | PRN
  Administered 2015-12-03 – 2015-12-04 (×3): 10 mg via INTRAVENOUS
  Filled 2015-12-03 (×3): qty 1

## 2015-12-03 MED ORDER — LABETALOL HCL 5 MG/ML IV SOLN
INTRAVENOUS | Status: AC
  Administered 2015-12-03: 10 mg via INTRAVENOUS
  Filled 2015-12-03: qty 4

## 2015-12-03 MED ORDER — LOSARTAN POTASSIUM 50 MG PO TABS: 50.0000 mg | ORAL_TABLET | Freq: Every day | ORAL | Status: DC

## 2015-12-03 MED ORDER — ONDANSETRON HCL 4 MG PO TABS: 4.0000 mg | ORAL_TABLET | Freq: Four times a day (QID) | ORAL | Status: DC | PRN

## 2015-12-03 MED ORDER — ACETAMINOPHEN 325 MG PO TABS: 650.0000 mg | ORAL_TABLET | Freq: Four times a day (QID) | ORAL | Status: DC | PRN

## 2015-12-03 MED ORDER — LABETALOL HCL 5 MG/ML IV SOLN
10.0000 mg | Freq: Once | INTRAVENOUS | Status: AC
  Administered 2015-12-03: 10 mg via INTRAVENOUS

## 2015-12-03 MED ORDER — SODIUM CHLORIDE 0.9% FLUSH
3.0000 mL | Freq: Two times a day (BID) | INTRAVENOUS | Status: DC
  Administered 2015-12-03: 3 mL via INTRAVENOUS

## 2015-12-03 MED ORDER — ASPIRIN 81 MG PO CHEW
81.0000 mg | CHEWABLE_TABLET | Freq: Every day | ORAL | Status: DC
  Administered 2015-12-03 – 2015-12-04 (×2): 81 mg via ORAL
  Filled 2015-12-03 (×2): qty 1

## 2015-12-03 MED ORDER — ONDANSETRON HCL 4 MG/2ML IJ SOLN
INTRAMUSCULAR | Status: AC
  Administered 2015-12-03: 4 mg via INTRAVENOUS
  Filled 2015-12-03: qty 2

## 2015-12-03 MED ORDER — ONDANSETRON HCL 4 MG/2ML IJ SOLN: 4.0000 mg | Freq: Four times a day (QID) | INTRAMUSCULAR | Status: DC | PRN

## 2015-12-03 MED ORDER — MORPHINE SULFATE (PF) 2 MG/ML IV SOLN: 2.0000 mg | INTRAVENOUS | Status: DC | PRN

## 2015-12-03 MED ORDER — IOPAMIDOL (ISOVUE-370) INJECTION 76%
100.0000 mL | Freq: Once | INTRAVENOUS | Status: AC | PRN
  Administered 2015-12-03: 100 mL via INTRAVENOUS

## 2015-12-03 MED ORDER — KETOROLAC TROMETHAMINE 30 MG/ML IJ SOLN: 30.0000 mg | Freq: Four times a day (QID) | INTRAMUSCULAR | Status: DC | PRN

## 2015-12-03 MED ORDER — MORPHINE SULFATE (PF) 2 MG/ML IV SOLN
4.0000 mg | Freq: Once | INTRAVENOUS | Status: AC
Start: 2015-12-03 — End: 2015-12-03
  Administered 2015-12-03: 4 mg via INTRAVENOUS
  Filled 2015-12-03: qty 2

## 2015-12-03 MED ORDER — CLINDAMYCIN HCL 150 MG PO CAPS
300.0000 mg | ORAL_CAPSULE | Freq: Three times a day (TID) | ORAL | Status: DC
  Administered 2015-12-03 – 2015-12-04 (×2): 300 mg via ORAL
  Filled 2015-12-03 (×3): qty 1
  Filled 2015-12-03: qty 2

## 2015-12-03 MED ORDER — HYDROCODONE-ACETAMINOPHEN 5-325 MG PO TABS
1.0000 | ORAL_TABLET | ORAL | Status: DC | PRN
  Administered 2015-12-04 (×3): 2 via ORAL
  Filled 2015-12-03 (×3): qty 2

## 2015-12-03 MED ORDER — SODIUM CHLORIDE 0.9 % IV SOLN
INTRAVENOUS | Status: DC
  Administered 2015-12-03 – 2015-12-04 (×2): via INTRAVENOUS

## 2015-12-03 MED ORDER — SODIUM CHLORIDE 0.9 % IV BOLUS (SEPSIS)
500.0000 mL | Freq: Once | INTRAVENOUS | Status: AC
  Administered 2015-12-03: 500 mL via INTRAVENOUS

## 2015-12-03 MED ORDER — HEPARIN (PORCINE) IN NACL 100-0.45 UNIT/ML-% IJ SOLN
1600.0000 [IU]/h | INTRAMUSCULAR | Status: DC
  Administered 2015-12-03: 1300 [IU]/h via INTRAVENOUS
  Administered 2015-12-04: 1350 [IU]/h via INTRAVENOUS
  Filled 2015-12-03 (×3): qty 250

## 2015-12-03 MED ORDER — ACETAMINOPHEN 650 MG RE SUPP: 650.0000 mg | Freq: Four times a day (QID) | RECTAL | Status: DC | PRN

## 2015-12-03 MED ORDER — HEPARIN BOLUS VIA INFUSION
4000.0000 [IU] | Freq: Once | INTRAVENOUS | Status: AC
  Administered 2015-12-03: 4000 [IU] via INTRAVENOUS
  Filled 2015-12-03: qty 4000

## 2015-12-03 MED ORDER — ATORVASTATIN CALCIUM 20 MG PO TABS: 40.0000 mg | ORAL_TABLET | Freq: Every day | ORAL | Status: DC

## 2015-12-03 MED ORDER — ONDANSETRON HCL 4 MG/2ML IJ SOLN
4.0000 mg | Freq: Once | INTRAMUSCULAR | Status: AC
  Administered 2015-12-03: 4 mg via INTRAVENOUS

## 2015-12-03 MED ORDER — CARVEDILOL 6.25 MG PO TABS
6.2500 mg | ORAL_TABLET | Freq: Two times a day (BID) | ORAL | Status: DC
  Administered 2015-12-04: 6.25 mg via ORAL

## 2015-12-03 MED ORDER — MORPHINE SULFATE (PF) 2 MG/ML IV SOLN
4.0000 mg | Freq: Once | INTRAVENOUS | Status: AC
  Administered 2015-12-03: 4 mg via INTRAVENOUS
  Filled 2015-12-03: qty 2

## 2015-12-03 NOTE — ED Notes (Signed)
When patient falls asleep SpO2 drops to 90%. Patient placed on 3L [place]. Tolerating well.

## 2015-12-03 NOTE — ED Notes (Addendum)
Discussed BP with admitting MD - he stated that the BP would not fix itself over night because the pt is not compliant with treatment and is in pain - MD stated he would consider modifing hydralazine order to every 4 hours

## 2015-12-03 NOTE — ED Notes (Signed)
Heparin rate changed per pharmacy recommendation

## 2015-12-03 NOTE — H&P (Signed)
[employer] - [place] [facility]   PATIENT NAME: Joshua Tate    MR#:  [id number]  DATE OF BIRTH:  February 09, 1975  DATE OF ADMISSION:  12/03/2015  PRIMARY CARE PHYSICIAN: [facility]   REQUESTING/REFERRING PHYSICIAN: Dr. Nita Sickle  CHIEF COMPLAINT:   Chief Complaint  Patient presents with  . Abdominal Pain  . Nausea  . Emesis    HISTORY OF PRESENT ILLNESS:  Joshua Tate  is a 40 y.o. male with a known history of Scrotal abscess, essential hypertension, coronary artery disease, ischemic cardiomyopathy with ejection fraction of 20%, mural thrombus who presents to the hospital due to left-sided abdominal pain associated with some nausea and vomiting. She was recently admitted to the hospital for a scrotal abscess underwent an incision and drainage and was also noted to have abnormal EKG with ST changes preoperatively and left AGAINST MEDICAL ADVICE prior to getting a full cardiac workup. He actually had a outpatient cardiac catheterization which showed severe LV dysfunction, with a mural thrombus. He was not on any anticoagulants and now presents to the hospital with the above complaints. Patient CT scan of the abdomen and pelvis is consistent with a left-sided renal infarct likely secondary to his mural thrombus noted on echocardiogram and also cardiac catheterization. Hospitalist services were contacted further treatment and evaluation. She admits to chills but no documented fever, he admits to nausea and vomiting which has been nonbloody and bilious in nature. He denies any dysuria, hematuria, frequency, hematochezia, melena or any other associated symptoms presently.  PAST MEDICAL HISTORY:   Past Medical History:  Diagnosis Date  . Abscess    to rectum  . Hypertension     PAST SURGICAL HISTORY:   Past Surgical History:  Procedure Laterality Date  . CARDIAC CATHETERIZATION Left 11/17/2015   Procedure: Left Heart Cath and Coronary Angiography;   Surgeon: Iran Ouch, MD;  Location: [facility] INVASIVE CV LAB;  Service: Cardiovascular;  Laterality: Left;  . EYE SURGERY    . INCISION AND DRAINAGE ABSCESS N/A 09/26/2015   Procedure: INCISION AND DRAINAGE perirectal  ABSCESS;  Surgeon: Leafy Ro, MD;  Location: [facility] ORS;  Service: General;  Laterality: N/A;  . INCISION AND DRAINAGE ABSCESS N/A 11/10/2015   Procedure: INCISION AND DRAINAGE ABSCESS;  Surgeon: Vanna Scotland, MD;  Location: [facility] ORS;  Service: Urology;  Laterality: N/A;    SOCIAL HISTORY:   Social History  Substance Use Topics  . Smoking status: Current Every Day Smoker    Packs/day: 2.00    Years: 20.00    Types: Cigars  . Smokeless tobacco: Never Used  . Alcohol use Yes     Comment: socially    FAMILY HISTORY:   Family History  Problem Relation Age of Onset  . CAD Mother     a. MI age 81  . Hypertension Mother   . Diabetes Mellitus II Mother   . Lung cancer Maternal Grandmother   . Hematuria Neg Hx     DRUG ALLERGIES:   Allergies  Allergen Reactions  . Aspirin     Other reaction(s): Other (See Comments)  . Latex Rash    REVIEW OF SYSTEMS:   Review of Systems  Constitutional: Positive for chills. Negative for fever and weight loss.  HENT: Negative for congestion, nosebleeds and tinnitus.   Eyes: Negative for blurred vision, double vision and redness.  Respiratory: Negative for cough, hemoptysis and shortness of breath.   Cardiovascular: Negative for chest pain, orthopnea, leg swelling and  PND.  Gastrointestinal: Positive for abdominal pain and nausea. Negative for diarrhea, melena and vomiting.  Genitourinary: Negative for dysuria, hematuria and urgency.  Musculoskeletal: Negative for falls and joint pain.  Neurological: Negative for dizziness, tingling, sensory change, focal weakness, seizures, weakness and headaches.  Endo/Heme/Allergies: Negative for polydipsia. Does not bruise/bleed easily.  Psychiatric/Behavioral: Negative for  depression and memory loss. The patient is not nervous/anxious.     MEDICATIONS AT HOME:   Prior to Admission medications   Medication Sig Start Date End Date Taking? Authorizing Provider  aspirin 81 MG chewable tablet Chew 1 tablet (81 mg total) by mouth daily. 11/13/15 12/13/15 Yes Wyatt Hasteavid K Hower, MD  atorvastatin (LIPITOR) 40 MG tablet Take 1 tablet (40 mg total) by mouth daily at 6 PM. 11/14/15  Yes Iran OuchMuhammad A Arida, MD  carvedilol (COREG) 6.25 MG tablet Take 1 tablet (6.25 mg total) by mouth 2 (two) times daily with a meal. 11/27/15 12/27/15 Yes Antonieta Ibaimothy J Gollan, MD  clindamycin (CLEOCIN) 300 MG capsule Take 1 capsule (300 mg total) by mouth 3 (three) times daily. 11/24/15  Yes Shannon A McGowan, PA-C  losartan (COZAAR) 50 MG tablet Take 1 tablet (50 mg total) by mouth daily. 11/27/15 11/26/16 Yes Antonieta Ibaimothy J Gollan, MD  naproxen sodium (ANAPROX) 220 MG tablet Take 220 mg by mouth 2 (two) times daily as needed.   Yes Historical Provider, MD      VITAL SIGNS:  Blood pressure (!) 194/133, pulse (!) 101, temperature 98.5 F (36.9 C), temperature source Oral, resp. rate (!) 23, height 6' (1.829 m), weight 79.4 kg (175 lb), SpO2 96 %.  PHYSICAL EXAMINATION:  Physical Exam  GENERAL:  40 y.o.-year-old patient lying in the bed in mild distress.  EYES: Pupils equal, round, reactive to light and accommodation. No scleral icterus. Extraocular muscles intact. 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  HEENT: Head atraumatic, normocephalic. Oropharynx and nasopharynx clear. No oropharyngeal erythema, moist oral mucosa, very poor dentition.  NECK:  Supple, no jugular venous distention. No thyroid enlargement, no tenderness.  LUNGS: Normal breath sounds bilaterally, no wheezing, rales, rhonchi. No use of accessory muscles of respiration.  CARDIOVASCULAR: S1, S2 RRR. No murmurs, rubs, gallops, clicks.  ABDOMEN: Soft, flank pain, no rebound, rigidity, nondistended. Bowel sounds present. No  organomegaly or mass.  EXTREMITIES: No pedal edema, cyanosis, or clubbing. + 2 pedal & radial pulses b/l.   NEUROLOGIC: Cranial nerves II through XII are intact. No focal Motor or sensory deficits appreciated b/l PSYCHIATRIC: The patient is alert and oriented x 3. Good affect.  SKIN: No obvious rash, lesion, or ulcer.   LABORATORY PANEL:   CBC  Recent Labs Lab 12/03/15 0740  WBC 12.7*  HGB 14.1  HCT 43.0  PLT 248   ------------------------------------------------------------------------------------------------------------------  Chemistries   Recent Labs Lab 12/03/15 0740  NA 139  K 4.0  CL 104  CO2 25  GLUCOSE 153*  BUN 9  CREATININE 0.97  CALCIUM 9.4  AST 24  ALT 19  ALKPHOS 90  BILITOT 0.9   ------------------------------------------------------------------------------------------------------------------  Cardiac Enzymes  Recent Labs Lab 12/03/15 0740  TROPONINI 0.05*   ------------------------------------------------------------------------------------------------------------------  RADIOLOGY:  Dg Abdomen 1 View  Result Date: 12/03/2015 CLINICAL DATA:  Abdominal pain with nausea and vomiting EXAM: ABDOMEN - 1 VIEW COMPARISON:  CT abdomen and pelvis September 26, 2015 FINDINGS: There is moderate stool throughout the colon and rectum. There is no bowel dilatation or air-fluid levels suggesting bowel obstruction. No free air. There is an apparent bone [place]  in the right femoral neck. Atherosclerotic calcification is noted in the aorta. IMPRESSION: No bowel obstruction or free air.  Aortic atherosclerosis noted. Electronically Signed   By: Bretta Bang III M.D.   On: 12/03/2015 08:30   Ct Angio Abd/pel W And/or Wo Contrast  Result Date: 12/03/2015 CLINICAL DATA:  Abdominal pain with nausea and vomiting since last night EXAM: CTA ABDOMEN AND PELVIS wITHOUT AND WITH CONTRAST TECHNIQUE: Multidetector CT imaging of the abdomen and pelvis was performed using the  standard protocol during bolus administration of intravenous contrast. Multiplanar reconstructed images and MIPs were obtained and reviewed to evaluate the vascular anatomy. CONTRAST:  100 cc Isovue 370 COMPARISON:  11/10/2015 FINDINGS: VASCULAR Aorta: Maximal diameter of the aorta is at the level of the renal artery takeoff measuring 3.2 cm. Mild diffuse atherosclerotic calcification. Aorta is patent within the lumen. Celiac: Patent.  Branch vessels are grossly patent. SMA: Patent. Renals: Single renal arteries are patent IMA: IMA is very diminutive but grossly patent. Inflow: Maximal right common iliac artery diameter is 2.3 cm. Internal and external iliac arteries are patent. Maximal right external iliac artery diameter is 1.1 cm. The common iliac artery is patent. Left common iliac artery maximal diameter is 1.5 cm. It is patent. Left external and internal iliac arteries are mildly ectatic and patent. Proximal Outflow: Grossly patent. Veins: Portal, splenic, superior mesenteric, hepatic, and renal veins are patent. Review of the MIP images confirms the above findings. NON-VASCULAR Lower chest: Dependent atelectasis Hepatobiliary: Unremarkable liver and gallbladder Pancreas: Unremarkable Spleen: Unremarkable Adrenals/Urinary Tract: Adrenal glands and right kidney are unremarkable. There are wedge-shaped low-density areas throughout the left kidney. This occurs primarily in the mid left kidney, and lower pole. The largest wedge-shaped area is in the lower pole measuring 5.1 x 3.3 cm. The appearance is most consistent with multiple infarcts. Unremarkable bladder. Stomach/Bowel: No obvious mass in the colon. Lack of oral contrast limits evaluation of the bowel. No obvious evidence of small-bowel obstruction. Lymphatic: No evidence of para-aortic adenopathy. No obvious iliac adenopathy. Bilateral inguinal adenopathy has not subjectively improved. Reproductive: Associated inflammatory changes in the perineum have  improved. The perineum and scrotum are only partially imaged on this study. Prostate is within normal limits. Other: No free-fluid. Musculoskeletal: No vertebral compression deformity. IMPRESSION: VASCULAR Maximal aortic diameter is 3.2 cm. Recommend followup by ultrasound in 3 years. This recommendation follows ACR consensus guidelines: White Paper of the ACR Incidental Findings Committee II on Vascular Findings. J Am Coll Radiol 2013; 10:789-794. Multiple wedge-shaped low-density defects within the left kidney most consistent with infarcts. This can be seen with vasculitis and embolic phenomenon. This is favored over an inflammatory process or infiltration by tumor. 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 Remainder of the iliac vasculature is ectatic. NON-VASCULAR Inflammatory changes in the perineum are improved. Inguinal adenopathy is stable. Electronically Signed   By: Jolaine Click M.D.   On: 12/03/2015 09:30     IMPRESSION AND PLAN:   40 year old male with past medical history of hypertension, recent incision and drainage of a scrotal abscess, ischemic cardiac myopathy ejection fraction of less than 25%, mural thrombus who presents to the hospital due to left flank abdominal pain associated with nausea and vomiting.  1. Left-sided renal infarct-this is the cause of patient's abdominal pain nausea vomiting and chills. -Patient was noted to have a mural thrombus on his echocardiogram and cardiac catheterization about 2 weeks ago. He is currently not on any anticoagulants. I suspect this is a  source of his infarct. -Start patient on a heparin nomogram, I will get a vascular surgery consult. -Supportive care with IV fluids, pain control. Patient's renal function is currently stable. Next  2. Elevated troponin-likely due to supply demand ischemia. He does have significant risk factors given his severe ischemic cardiomyopathy. -I will observe him on telemetry, cycle his cardiac markers. I  will get a cardiology consult. Next-continue aspirin, heparin nomogram, beta blocker, statin.  3. Accelerated hypertension-continue carvedilol, losartan. I will add some as needed IV hydralazine. Follow hemodynamics.  4. Hyperlipidemia-continue atorvastatin.  5. Recent scrotal abscess status post incision and drainage-continue clindamycin.  All the records are reviewed and case discussed with ED provider. Management plans discussed with the patient, family and they are in agreement.  CODE STATUS: Full  TOTAL TIME TAKING CARE OF THIS PATIENT: 45 minutes.    [place] SirenSAINANI,Zaden Sako J M.D on 12/03/2015 at 10:48 AM  Between 7am to 6pm - Pager - [phone]  After 6pm go to [email or web address] - password [email or web address] [facility]  [facility]  Office  [phone]  CC: Primary care physician; [facility]

## 2015-12-03 NOTE — Consult Note (Signed)
ANTICOAGULATION CONSULT NOTE - Initial Consult  Pharmacy Consult for heparin drip Indication: LV thrombus with emboli to Left kidney  Allergies  Allergen Reactions  . Aspirin     Other reaction(s): Other (See Comments)  . Latex Rash    Patient Measurements: Height: 6' (182.9 cm) Weight: 175 lb (79.4 kg) IBW/kg (Calculated) : 77.6 Heparin Dosing Weight: 79.4kg  Vital Signs: Temp: 98.5 F (36.9 C) (11/01 0749) Temp Source: Oral (11/01 0749) BP: 157/87 (11/01 1800) Pulse Rate: 94 (11/01 1143)  Labs:  Recent Labs  12/03/15 0740 12/03/15 0742 12/03/15 1737  HGB 14.1  --   --   HCT 43.0  --   --   PLT 248  --   --   APTT  --  35  --   LABPROT  --  14.4  --   INR  --  1.11  --   HEPARINUNFRC  --   --  0.33  CREATININE 0.97  --   --   TROPONINI 0.05*  --   --     Estimated Creatinine Clearance: 110 mL/min (by C-G formula based on SCr of 0.97 mg/dL).   Medical History: Past Medical History:  Diagnosis Date  . Abscess    to rectum  . Hypertension     Medications:  Scheduled:     Assessment: Pt is a 40 year old male found to have a LV thrombus with emboli to Left kidney. No home anticoagulation found on med rec. Baseline labs all WNL.  Goal of Therapy:  Heparin level 0.3-0.7 units/ml Monitor platelets by anticoagulation protocol: Yes   Plan:  11/1 1737 HL: 0.33 Heparin level at the lower end of therapeutic. Will increase infusion to 1350 units and recheck HL in 6 hours.   Cahterine Heinzel M Nikolette Reindl 12/03/2015,6:07 PM

## 2015-12-03 NOTE — ED Triage Notes (Signed)
Pt to ED via EMS for abdominal pain, nausea/vomiting that started last night around midnight.  Pt states it started approx. 30 mins-1hr after eating last meal.  Pt is on 2nd round of antibiotics for an abcess on scrotum. Also of note, pt was recently diagnosed with mild heart attack, per pt.

## 2015-12-03 NOTE — Consult Note (Signed)
Cardiology Consultation Note  Patient ID: Joshua Tate, MRN: [id number], DOB/AGE: 1975/10/27 40 y.o. Admit date: 12/03/2015   Date of Consult: 12/03/2015 Primary Physician: [facility] Primary Cardiologist: Dr. Mariah Milling, MD Requesting Physician: Dr. Cherlynn Kaiser, MD  Chief Complaint: Left-sided pain Reason for Consult: Elevated troponin   HPI: 40 y.o. 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   He was recently admitted in October 2017 after undergoing a surgical I&D of a recurrent abscess and found to have an abnormal EKG and echo in the PACU. He was asymptomatic. Stat echo showed >>>>. He was initially treated with IV heparin for possible mural thrombus and transitioned to ASA only per MD. He was started on heart failure medications and refused inpatient cardiac cath, wanting to have this done as an outpatient. He did have a diagnostic cardiac cath with Dr. Kirke Corin, MD on 11/17/2015 that showed severe one-vessel CAD with an occluded distal LAD with right-to-left collaterals from the RCA. There was also an occluded D2 with faint collaterals with borderline disease in OM1. Moderately elevated LVEDP. LV angiography was not diagnostic and it was elected not to do power injection given suspected LV thrombus. His heart failure medications were further titrated. He saw Dr. Mariah Milling in follow up on 10/26 and was doing well with some SOB. He had not started Lasix at that time. He was continued on current medications.   He presented to [facility] ED on 11/1 with sudden onset of left-sided abdominal pain with associated nausea and vomiting. He was found to have a left-sided renal infarct felt to be 2/2 mural thrombus. Vascular surgery has been  consulted by IM. He was started on a heparin gtt. He also noted longstanding diarrhea since his hospital discharge in October. Labs showed a minimally elevated troponin of 0.05. Lactic acid 1.3, lipase 18, SCr 0.97, K+ 4.0, WBC 12.7, hgb 14.1. EKG showed NSR, 97 bpm, nonspecific IVCD, LVH with borderline anterior st elevation likely 2/2 LVH, inferolateral TWI. Never with any chest pain. Stable SOB. Currently with mild left-flank pain.    Past Medical History:  Diagnosis Date  . Abscess    to rectum  . Hypertension       Most Recent Cardiac Studies: Left heart catheterization 11/17/2015: Conclusion     Dist LAD lesion, 100 %stenosed.  Mid LAD to Dist LAD lesion, 30 %stenosed.  Dist Cx lesion, 40 %stenosed.  Ost 2nd Diag to 2nd Diag lesion, 100 %stenosed.  Ost 1st Mrg to 1st Mrg lesion, 70 %stenosed.  LV end diastolic pressure is moderately elevated.   1. Severe one-vessel coronary artery disease with occluded distal LAD with right-to-left collaterals from the right coronary artery. Also occluded second diagonal with faint collaterals. Borderline disease in OM1. 2. Moderately elevated left ventricular end-diastolic pressure. Left ventricular angiography was not diagnostic and I elected not to do power injection given suspected LV thrombus.  Recommendations: Recommend medical therapy for coronary artery disease and ischemic cardiomyopathy. I increased the dose of carvedilol to 6.25 mg twice daily. The patient needs a small dose diuretic.   Echo 11/10/2015: Study Conclusions  - Left ventricle: The cavity size was moderately dilated. There was   mild concentric hypertrophy. Systolic function was severely   reduced. The estimated ejection fraction  was in the range of 20%   to 25%. Akinesis of the mid-apicalanterior and anterolateral   myocardium. Dyskinesis of the apical myocardium. Features are   consistent with a pseudonormal left ventricular filling pattern,   with  concomitant abnormal relaxation and increased filling   pressure (grade 2 diastolic dysfunction). There was severe   spontaneous echo contrast, indicative of stasis. Cannot exclude   apical thrombus. - Aortic valve: There was mild regurgitation. - Mitral valve: There was mild to moderate regurgitation. - Left atrium: The atrium was moderately dilated. - Right atrium: The atrium was mildly dilated.   Surgical History:  Past Surgical History:  Procedure Laterality Date  . CARDIAC CATHETERIZATION Left 11/17/2015   Procedure: Left Heart Cath and Coronary Angiography;  Surgeon: Iran OuchMuhammad A Arida, MD;  Location: [facility] INVASIVE CV LAB;  Service: Cardiovascular;  Laterality: Left;  . EYE SURGERY    . INCISION AND DRAINAGE ABSCESS N/A 09/26/2015   Procedure: INCISION AND DRAINAGE perirectal  ABSCESS;  Surgeon: Leafy Roiego F Pabon, MD;  Location: [facility] ORS;  Service: General;  Laterality: N/A;  . INCISION AND DRAINAGE ABSCESS N/A 11/10/2015   Procedure: INCISION AND DRAINAGE ABSCESS;  Surgeon: Vanna ScotlandAshley Brandon, MD;  Location: [facility] ORS;  Service: Urology;  Laterality: N/A;     Home Meds: Prior to Admission medications   Medication Sig Start Date End Date Taking? Authorizing Provider  aspirin 81 MG chewable tablet Chew 1 tablet (81 mg total) by mouth daily. 11/13/15 12/13/15 Yes Wyatt Hasteavid K Hower, MD  atorvastatin (LIPITOR) 40 MG tablet Take 1 tablet (40 mg total) by mouth daily at 6 PM. 11/14/15  Yes Iran OuchMuhammad A Arida, MD  carvedilol (COREG) 6.25 MG tablet Take 1 tablet (6.25 mg total) by mouth 2 (two) times daily with a meal. 11/27/15 12/27/15 Yes Antonieta Ibaimothy J Gollan, MD  clindamycin (CLEOCIN) 300 MG capsule Take 1 capsule (300 mg total) by mouth 3 (three) times daily. 11/24/15  Yes Shannon A McGowan, PA-C  losartan (COZAAR) 50 MG tablet Take 1 tablet (50 mg total) by mouth daily. 11/27/15 11/26/16 Yes Antonieta Ibaimothy J Gollan, MD  naproxen sodium (ANAPROX) 220 MG tablet Take 220 mg by mouth 2 (two) times daily as needed.    Yes Historical Provider, MD    Inpatient Medications:    . heparin 1,300 Units/hr (12/03/15 1118)    Allergies:  Allergies  Allergen Reactions  . Aspirin     Other reaction(s): Other (See Comments)  . Latex Rash    Social History   Social History  . Marital status: Single    Spouse name: N/A  . Number of children: N/A  . Years of education: N/A   Occupational History  . Not on file.   Social History Main Topics  . Smoking status: Current Every Day Smoker    Packs/day: 2.00    Years: 20.00    Types: Cigars  . Smokeless tobacco: Never Used  . Alcohol use Yes     Comment: socially  . Drug use:     Frequency: 7.0 times per week    Types: Marijuana  . Sexual activity: Not on file   Other Topics Concern  . Not on file   Social History Narrative  . No narrative on file     Family History  Problem Relation Age of Onset  . CAD Mother     a. MI age 41  . Hypertension Mother   . Diabetes Mellitus II Mother   . Lung cancer Maternal Grandmother   .  Hematuria Neg Hx      Review of Systems: Review of Systems  Constitutional: Positive for malaise/fatigue. Negative for chills, diaphoresis, fever and weight loss.  HENT: Negative for congestion.   Eyes: Negative for discharge and redness.  Respiratory: Negative for cough, hemoptysis, sputum production, shortness of breath and wheezing.   Cardiovascular: Negative for chest pain, palpitations, orthopnea, claudication, leg swelling and PND.  Gastrointestinal: Positive for abdominal pain, diarrhea, nausea and vomiting. Negative for blood in stool, constipation, heartburn and melena.  Genitourinary: Positive for flank pain and frequency. Negative for dysuria, hematuria and urgency.       Nocturia  Musculoskeletal: Negative for falls and myalgias.  Skin: Negative for rash.  Neurological: Positive for weakness. Negative for dizziness, tingling, tremors, sensory change, speech change, focal weakness and loss of  consciousness.  Endo/Heme/Allergies: Does not bruise/bleed easily.  Psychiatric/Behavioral: Negative for substance abuse. The patient is not nervous/anxious.   All other systems reviewed and are negative.   Labs:  Recent Labs  12/03/15 0740  TROPONINI 0.05*   Lab Results  Component Value Date   WBC 12.7 (H) 12/03/2015   HGB 14.1 12/03/2015   HCT 43.0 12/03/2015   MCV 89.9 12/03/2015   PLT 248 12/03/2015     Recent Labs Lab 12/03/15 0740  NA 139  K 4.0  CL 104  CO2 25  BUN 9  CREATININE 0.97  CALCIUM 9.4  PROT 8.7*  BILITOT 0.9  ALKPHOS 90  ALT 19  AST 24  GLUCOSE 153*   Lab Results  Component Value Date   CHOL 104 11/11/2015   HDL 42 11/11/2015   LDLCALC 54 11/11/2015   TRIG 38 11/11/2015   No results found for: DDIMER  Radiology/Studies:  Dg Abdomen 1 View  Result Date: 12/03/2015 CLINICAL DATA:  Abdominal pain with nausea and vomiting EXAM: ABDOMEN - 1 VIEW COMPARISON:  CT abdomen and pelvis September 26, 2015 FINDINGS: There is moderate stool throughout the colon and rectum. There is no bowel dilatation or air-fluid levels suggesting bowel obstruction. No free air. There is an apparent bone island in the right femoral neck. Atherosclerotic calcification is noted in the aorta. IMPRESSION: No bowel obstruction or free air.  Aortic atherosclerosis noted. Electronically Signed   By: Bretta BangWilliam  Woodruff III M.D.   On: 12/03/2015 08:30   Ct Pelvis W Contrast  Result Date: 11/10/2015 CLINICAL DATA:  Scrotal and perineal abscess. EXAM: CT PELVIS WITH CONTRAST TECHNIQUE: Multidetector CT imaging of the pelvis was performed using the standard protocol following the bolus administration of intravenous contrast. CONTRAST:  100mL ISOVUE-300 IOPAMIDOL (ISOVUE-300) INJECTION 61% COMPARISON:  CT scan of September 26, 2015. FINDINGS: Urinary Tract: Urinary bladder is decompressed. Visualized portions of lower poles of both kidneys appear normal. No definite ureteral dilatation is  noted. Bowel:  There is no evidence of bowel obstruction. Vascular/Lymphatic: Atherosclerosis of abdominal aorta is noted. 3 cm infrarenal abdominal aortic aneurysm is noted. Bilateral inguinal adenopathy is noted with 3.2 x 1.5 cm lymph node seen in right inguinal region which is enlarged compared to prior exam. Reproductive:  Prostate gland appears normal. Other: Large amount of inflammatory changes are noted in the left perineal region, although no large defined fluid collection is seen in this area. The abscess noted on prior exam appears to have significantly improved. However, inflammatory changes are seen involving the scrotum, with 2.3 cm rounded low density seen in the right scrotum concerning for possible abscess. Musculoskeletal: No significant osseous abnormality is noted. IMPRESSION: 3  cm infrarenal abdominal aortic aneurysm. Recommend followup by ultrasound in 3 years. This recommendation follows ACR consensus guidelines: White Paper of the ACR Incidental Findings Committee II on Vascular Findings. J Am Coll Radiol 2013; 10:789-794. Bilateral inguinal adenopathy is noted, right greater than left, which most likely is inflammatory in origin. Extensive inflammatory changes are seen in the left perineal region, although the abscess noted on prior exam appears to be significantly improved. This is consistent with extensive cellulitis. There is noted an increased amount of inflammatory changes involving the scrotum compared to prior exam, with 2.3 cm low density seen in right scrotum concerning for possible abscess. This is new since prior exam. Electronically Signed   By: Lupita Raider, M.D.   On: 11/10/2015 09:59   Ct Angio Abd/pel W And/or Wo Contrast  Result Date: 12/03/2015 CLINICAL DATA:  Abdominal pain with nausea and vomiting since last night EXAM: CTA ABDOMEN AND PELVIS wITHOUT AND WITH CONTRAST TECHNIQUE: Multidetector CT imaging of the abdomen and pelvis was performed using the standard  protocol during bolus administration of intravenous contrast. Multiplanar reconstructed images and MIPs were obtained and reviewed to evaluate the vascular anatomy. CONTRAST:  100 cc Isovue 370 COMPARISON:  11/10/2015 FINDINGS: VASCULAR Aorta: Maximal diameter of the aorta is at the level of the renal artery takeoff measuring 3.2 cm. Mild diffuse atherosclerotic calcification. Aorta is patent within the lumen. Celiac: Patent.  Branch vessels are grossly patent. SMA: Patent. Renals: Single renal arteries are patent IMA: IMA is very diminutive but grossly patent. Inflow: Maximal right common iliac artery diameter is 2.3 cm. Internal and external iliac arteries are patent. Maximal right external iliac artery diameter is 1.1 cm. The common iliac artery is patent. Left common iliac artery maximal diameter is 1.5 cm. It is patent. Left external and internal iliac arteries are mildly ectatic and patent. Proximal Outflow: Grossly patent. Veins: Portal, splenic, superior mesenteric, hepatic, and renal veins are patent. Review of the MIP images confirms the above findings. NON-VASCULAR Lower chest: Dependent atelectasis Hepatobiliary: Unremarkable liver and gallbladder Pancreas: Unremarkable Spleen: Unremarkable Adrenals/Urinary Tract: Adrenal glands and right kidney are unremarkable. There are wedge-shaped low-density areas throughout the left kidney. This occurs primarily in the mid left kidney, and lower pole. The largest wedge-shaped area is in the lower pole measuring 5.1 x 3.3 cm. The appearance is most consistent with multiple infarcts. Unremarkable bladder. Stomach/Bowel: No obvious mass in the colon. Lack of oral contrast limits evaluation of the bowel. No obvious evidence of small-bowel obstruction. Lymphatic: No evidence of para-aortic adenopathy. No obvious iliac adenopathy. Bilateral inguinal adenopathy has not subjectively improved. Reproductive: Associated inflammatory changes in the perineum have improved. The  perineum and scrotum are only partially imaged on this study. Prostate is within normal limits. Other: No free-fluid. Musculoskeletal: No vertebral compression deformity. IMPRESSION: VASCULAR Maximal aortic diameter is 3.2 cm. Recommend followup by ultrasound in 3 years. This recommendation follows ACR consensus guidelines: White Paper of the ACR Incidental Findings Committee II on Vascular Findings. J Am Coll Radiol 2013; 10:789-794. Multiple wedge-shaped low-density defects within the left kidney most consistent with infarcts. This can be seen with vasculitis and embolic phenomenon. This is favored over an inflammatory process or infiltration by tumor. 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 Remainder of the iliac vasculature is ectatic. NON-VASCULAR Inflammatory changes in the perineum are improved. Inguinal adenopathy is stable. Electronically Signed   By: Jolaine Click M.D.   On: 12/03/2015 09:30    EKG: Interpreted by  me showed: NSR, 97 bpm, nonspecific IVCD, LVH with borderline anterior st elevation likely 2/2 LVH, inferolateral TWI Telemetry: Interpreted by me showed: NSR, 80's bpm  Weights: Filed Weights   12/03/15 0750  Weight: 175 lb (79.4 kg)     Physical Exam: Blood pressure (!) 179/117, pulse 94, temperature 98.5 F (36.9 C), temperature source Oral, resp. rate 17, height 6' (1.829 m), weight 175 lb (79.4 kg), SpO2 98 %. Body mass index is 23.73 kg/m. General: Well developed, well nourished, in no acute distress. Head: Normocephalic, atraumatic, sclera non-icteric, no xanthomas, nares are without discharge.  Neck: Negative for carotid bruits. JVD not elevated. Lungs: Clear bilaterally to auscultation without wheezes, rales, or rhonchi. Breathing is unlabored. Heart: RRR with S1 S2. No murmurs, rubs, or gallops appreciated. Abdomen: Soft, tender left flank, non-distended with normoactive bowel sounds. No hepatomegaly. No rebound/guarding. No obvious abdominal  masses. Msk:  Strength and tone appear normal for age. Extremities: No clubbing or cyanosis. No edema. Distal pedal pulses are 2+ and equal bilaterally. Neuro: Alert and oriented X 3. No facial asymmetry. No focal deficit. Moves all extremities spontaneously. Psych:  Responds to questions appropriately with a normal affect.    Assessment and Plan:  Principal Problem:   Renal infarct [facility]) Active Problems:   Cardiomyopathy (HCC)   Mural thrombus of cardiac apex   Hypertension   Diabetes mellitus (HCC)   Nausea & vomiting   Diarrhea    1. Elevated troponin: -No symptoms concerning for ischemia -Recent cardiac cath 2 weeks ago as above -Continue to cycle troponin level to trend -On heparin gtt for renal infarct  -No plans for further ischemic evaluation at this time unless troponin significantly trends upwards  2. Left-renal infarct: -Likely in the setting of previously presumed mural thrombus -On heparin gtt at this time -Trnasition to warfarin per pharmacy once cleared by vascular surgery  3. Likely mural thrombus: -Leading to #2 -As above  4. CAD: -Continue current medical therapy -Will likely be warfarin in place of ASA at time of discharge  5. Chronic systolic CHF/ICM: -He does not appear volume overloaded at this time -Continue ARB, BB, and Lasix -Has yet to start Lasix as an outpatient -Consider changing ARB to Entresto if he can afford this medication -Add spironolactone to regimen prior to discharge -Will discuss need for LifeVest with MD given ICM with EF of 20% -Will need 3 months of uninterupted optimal medical therapy prior to having repeat echocardiogram to assess for improvement in LV systolic function. If EF remains less than 35% at that time he would need referral to EP for evaluation of possible ICD   6. Hypertensive heart diseae with heart failure: -Titrate medications to obtain optimal BP   Signed, Eula Listen, PA-C [facility] Pager: [phone] 12/03/2015, 2:53 PM

## 2015-12-03 NOTE — Consult Note (Signed)
ANTICOAGULATION CONSULT NOTE - Initial Consult  Pharmacy Consult for heparin drip Indication: LV thrombus with emboli to Left kidney  Allergies  Allergen Reactions  . Aspirin     Other reaction(s): Other (See Comments)  . Latex Rash    Patient Measurements: Height: 6' (182.9 cm) Weight: 175 lb (79.4 kg) IBW/kg (Calculated) : 77.6 Heparin Dosing Weight: 79.4kg  Vital Signs: Temp: 98.5 F (36.9 C) (11/01 0749) Temp Source: Oral (11/01 0749) BP: 179/117 (11/01 1143) Pulse Rate: 94 (11/01 1143)  Labs:  Recent Labs  12/03/15 0740 12/03/15 0742  HGB 14.1  --   HCT 43.0  --   PLT 248  --   APTT  --  35  LABPROT  --  14.4  INR  --  1.11  CREATININE 0.97  --   TROPONINI 0.05*  --     Estimated Creatinine Clearance: 110 mL/min (by C-G formula based on SCr of 0.97 mg/dL).   Medical History: Past Medical History:  Diagnosis Date  . Abscess    to rectum  . Hypertension     Medications:  Scheduled:  . labetalol      . labetalol  10 mg Intravenous Once    Assessment: Pt is a 40 year old male found to have a LV thrombus with emboli to Left kidney. No home anticoagulation found on med rec. Baseline labs all WNL.  Goal of Therapy:  Heparin level 0.3-0.7 units/ml Monitor platelets by anticoagulation protocol: Yes   Plan:  Give 4000 units bolus x 1 Start heparin infusion at 1300 units/hr Check anti-Xa level in 6 hours and daily while on heparin Continue to monitor H&H and platelets  Cora Brierley D Tametria Aho 12/03/2015,12:45 PM

## 2015-12-03 NOTE — ED Provider Notes (Signed)
[facility] Provider Note  ____________________________________________  Time seen: Approximately 7:44 AM  I have reviewed the triage vital signs and the nursing notes.   HISTORY  Chief Complaint Abdominal Pain; Nausea; and Emesis   HPI Joshua Tate is a 40 y.o. male with recently diagnosed ischemic cardiomyopathywith EF of 20%, small LV thrombus, AAA, diabetes, hypertension who presents for evaluation of abdominal pain, nausea, vomiting, diarrhea. Patient left AMA from hospital on 11/12/15 after being admitted for a scrotal abscess that was drained by urology. During that admission patient was found to have an abnormal EKG which led to cardiology consult, LHC showing severe CAD currently being managed medically, and diagnosis of ischemic cardiomyopathy and possible small LV thrombus. He reports that since then he has had multiple episodes a day of watery diarrhea with no melena or blood. Yesterday evening he started having abdominal pain that is diffuse, cramping and started vomiting at 3 AM this morning. Patient has had multiple episodes of nonbloody nonbilious emesis. Currently endorse severe abdominal pain that is worse on the left and lower side of his abdomen and nonradiating. Patient denies chest pain, shortness of breath, cough. He does endorse chills but denies fever and also denies dysuria or hematuria.  Past Medical History:  Diagnosis Date  . Abscess    to rectum  . Hypertension     Patient Active Problem List   Diagnosis Date Noted  . Unstable angina (HCC)   . Abscess of scrotum 11/10/2015  . Abnormal EKG 11/10/2015  . Hypertension 11/10/2015  . Diabetes mellitus (HCC) 11/10/2015  . Cardiomyopathy (HCC) 11/10/2015  . 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 Perirectal abscess 09/26/2015  . 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 Esophageal reflux 12/04/2008    Past Surgical History:  Procedure Laterality Date  . CARDIAC CATHETERIZATION Left 11/17/2015   Procedure: Left Heart Cath and Coronary Angiography;  Surgeon: Iran Ouch, MD;  Location: [facility] INVASIVE CV LAB;  Service: Cardiovascular;  Laterality: Left;  . EYE SURGERY    . INCISION AND DRAINAGE ABSCESS N/A 09/26/2015   Procedure: INCISION AND DRAINAGE perirectal  ABSCESS;  Surgeon: Leafy Ro, MD;  Location: [facility] ORS;  Service: General;  Laterality: N/A;  . INCISION AND DRAINAGE ABSCESS N/A 11/10/2015   Procedure: INCISION AND DRAINAGE ABSCESS;  Surgeon: Vanna Scotland, MD;  Location: [facility] ORS;  Service: Urology;  Laterality: N/A;    Prior to Admission medications   Medication Sig Start Date End Date Taking? Authorizing Provider  aspirin 81 MG chewable tablet Chew 1 tablet (81 mg total) by mouth daily. 11/13/15 12/13/15 Yes Wyatt Haste, MD  atorvastatin (LIPITOR) 40 MG tablet Take 1 tablet (40 mg total) by mouth daily at 6 PM. 11/14/15  Yes Iran Ouch, MD  carvedilol (COREG) 6.25 MG tablet Take 1 tablet (6.25 mg total) by mouth 2 (two) times daily with a meal. 11/27/15 12/27/15 Yes Antonieta Iba, MD  clindamycin (CLEOCIN) 300 MG capsule Take 1 capsule (300 mg total) by mouth 3 (three) times daily. 11/24/15  Yes Shannon A McGowan, PA-C  losartan (COZAAR) 50 MG tablet Take 1 tablet (50 mg total) by mouth daily. 11/27/15 11/26/16 Yes Antonieta Iba, MD  naproxen sodium (ANAPROX) 220 MG tablet Take 220 mg by mouth  2 (two) times daily as needed.   Yes Historical Provider, MD    Allergies Aspirin and Latex  Family History  Problem Relation Age of Onset  . CAD Mother     a. MI age 41  . Hypertension Mother   . Diabetes Mellitus II Mother   . Lung cancer Maternal Grandmother   . Hematuria Neg Hx      Social History Social History  Substance Use Topics  . Smoking status: Current Every Day Smoker    Types: Cigars  . Smokeless tobacco: Never Used  . Alcohol use Yes     Comment: socially    Review of Systems  Constitutional: Negative for fever. + chills Eyes: Negative for visual changes. ENT: Negative for sore throat. Cardiovascular: Negative for chest pain. Respiratory: Negative for shortness of breath. Gastrointestinal: + diffuse abdominal pain, vomiting and diarrhea. Genitourinary: Negative for dysuria. Musculoskeletal: Negative for back pain. Skin: Negative for rash. Neurological: Negative for headaches, weakness or numbness.  ____________________________________________   PHYSICAL EXAM:  VITAL SIGNS: Vitals:   12/03/15 0800 12/03/15 0830  BP: (!) 192/127 (!) 194/133  Pulse: 83 (!) 101  Resp: 16 (!) 23  Temp:     Constitutional: Alert and oriented, actively vomiting and moderate distress due to pain.  HEENT:      Head: Normocephalic and atraumatic.         Eyes: Conjunctivae are normal. Sclera is non-icteric. EOMI. PERRL      Mouth/Throat: Mucous membranes are dry.       Neck: Supple with no signs of meningismus. Cardiovascular: Regular rate and rhythm. No murmurs, gallops, or rubs. 2+ symmetrical distal pulses are present in all extremities. No JVD. Respiratory: Normal respiratory effort. Lungs are clear to auscultation bilaterally. No wheezes, crackles, or rhonchi.  Gastrointestinal: Soft, mild tenderness to palpation diffusely, hypoactive bowel sounds. No rebound or guarding. Musculoskeletal: Nontender with normal range of motion in all extremities. No edema, cyanosis, or erythema of extremities. Neurologic: Normal speech and language. Face is symmetric. Moving all extremities. No gross focal neurologic deficits are appreciated. Skin: Skin is warm, dry and intact. No rash noted. Psychiatric: Mood and affect are normal. Speech and behavior are  normal.  ____________________________________________   LABS (all labs ordered are listed, but only abnormal results are displayed)  Labs Reviewed  CBC WITH DIFFERENTIAL/PLATELET - Abnormal; Notable for the following:       Result Value   WBC 12.7 (*)    RDW 16.5 (*)    Neutro Abs 8.4 (*)    All other components within normal limits  COMPREHENSIVE METABOLIC PANEL - Abnormal; Notable for the following:    Glucose, Bld 153 (*)    Total Protein 8.7 (*)    All other components within normal limits  TROPONIN I - Abnormal; Notable for the following:    Troponin I 0.05 (*)    All other components within normal limits  C DIFFICILE QUICK SCREEN W PCR REFLEX  LIPASE, BLOOD  LACTIC ACID, PLASMA  URINALYSIS COMPLETEWITH MICROSCOPIC ([facility] ONLY)  APTT  PROTIME-INR   ____________________________________________  EKG  ED ECG REPORT I, Nita Sicklearolina Keshawn Fiorito, the attending physician, personally viewed and interpreted this ECG.  Normal sinus rhythm, rate of 97, normal intervals, normal axis, diffuse T-wave inversions on inferior and lateral leads with ST elevations on anterior leads which are unchanged from prior. ____________________________________________  RADIOLOGY  CT a/p:   IMPRESSION: VASCULAR  Maximal aortic diameter is 3.2 cm. Recommend followup by ultrasound in 3 years.  This recommendation follows ACR consensus guidelines: White Paper of the ACR Incidental Findings Committee II on Vascular Findings. J Am Coll Radiol 2013; 10:789-794.  Multiple wedge-shaped low-density defects within the left kidney most consistent with infarcts. This can be seen with vasculitis and embolic phenomenon. This is favored over an inflammatory process or infiltration by tumor.  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 Remainder of the iliac vasculature is ectatic.  NON-VASCULAR  Inflammatory changes in the perineum are improved. Inguinal adenopathy is  stable. ____________________________________________   PROCEDURES  Procedure(s) performed: None Procedures Critical Care performed: yes  CRITICAL CARE Performed by: Nita Sicklearolina Julienne Vogler  ?  Total critical care time: 35 min  Critical care time was exclusive of separately billable procedures and treating other patients.  Critical care was necessary to treat or prevent imminent or life-threatening deterioration.  Critical care was time spent personally by me on the following activities: development of treatment plan with patient and/or surrogate as well as nursing, discussions with consultants, evaluation of patient's response to treatment, examination of patient, obtaining history from patient or surrogate, ordering and performing treatments and interventions, ordering and review of laboratory studies, ordering and review of radiographic studies, pulse oximetry and re-evaluation of patient's condition.  ____________________________________________   INITIAL IMPRESSION / ASSESSMENT AND PLAN / ED COURSE  40 y.o. male with recently diagnosed ischemic cardiomyopathywith EF of 20%, small LV thrombus, diabetes, hypertension who presents for evaluation of diffuse abdominal pain, nausea, vomiting, diarrhea. Patient actively vomiting and in moderate distress due to pain, patient is afebrile, no tachycardia, abdomen has hypoactive bowel sounds diffusely mild tenderness to palpation. Remaining of his exam is within normal limits. The fresh diagnoses including ischemic bowel with known LV thrombus, C. difficile  with diarrhea that has been persistent since being in the hospital, diverticulitis, gastroenteritis, pancreatitis. Plan for IVF, IV morphine and IV zofran. CBC, CMP, lipase, lactate, troponin, CT a/p.  Clinical Course  Comment By Time  CT concerning for multiple wedge shape kidney infarcts from possible emboli especially on patient with LV thrombus. Patient is not on anticoagulation. Will  discuss with vascular surgery and hospitalist for management admission. Patient reports that pain is returning and is requesting more pain medicine. Will redose morphine. Nita Sicklearolina Lanayah Gartley, MD 11/01 [id number]    Pertinent labs & imaging results that were available during my care of the patient were reviewed by me and considered in my medical decision making (see chart for details).    ____________________________________________   FINAL CLINICAL IMPRESSION(S) / ED DIAGNOSES  Final diagnoses:  Renal infarct (HCC)  Abdominal pain, unspecified abdominal location  Diarrhea, unspecified type  Non-intractable vomiting with nausea, unspecified vomiting type      NEW MEDICATIONS STARTED DURING THIS VISIT:  New Prescriptions   No medications on file     Note:  This document was prepared using Dragon voice recognition software and may include unintentional dictation errors.    Nita Sicklearolina Nahun Kronberg, MD 12/03/15 1018

## 2015-12-04 ENCOUNTER — Encounter: Payer: Self-pay | Admitting: Student

## 2015-12-04 DIAGNOSIS — I10 Essential (primary) hypertension: Secondary | ICD-10-CM

## 2015-12-04 DIAGNOSIS — I509 Heart failure, unspecified: Secondary | ICD-10-CM

## 2015-12-04 DIAGNOSIS — R1010 Upper abdominal pain, unspecified: Secondary | ICD-10-CM

## 2015-12-04 LAB — TROPONIN I
Troponin I: 0.07 ng/mL (ref <0.03)
Troponin I: 0.07 ng/mL (ref <0.03)

## 2015-12-04 LAB — CBC
HEMATOCRIT: 41.3 % (ref 40.0–52.0)
Hemoglobin: 13.6 g/dL (ref 13.0–18.0)
MCH: 29.2 pg (ref 26.0–34.0)
MCHC: 33 g/dL (ref 32.0–36.0)
MCV: 88.6 fL (ref 80.0–100.0)
PLATELETS: 233 10*3/uL (ref 150–440)
RBC: 4.66 MIL/uL (ref 4.40–5.90)
RDW: 16.2 % — AB (ref 11.5–14.5)
WBC: 12.6 10*3/uL — ABNORMAL HIGH (ref 3.8–10.6)

## 2015-12-04 LAB — BASIC METABOLIC PANEL
Anion gap: 10 (ref 5–15)
BUN: 9 mg/dL (ref 6–20)
CO2: 23 mmol/L (ref 22–32)
Calcium: 8.9 mg/dL (ref 8.9–10.3)
Chloride: 105 mmol/L (ref 101–111)
Creatinine, Ser: 0.67 mg/dL (ref 0.61–1.24)
GFR calc Af Amer: >60 mL/min (ref >60)
GFR calc non Af Amer: >60 mL/min (ref >60)
Glucose, Bld: 106 mg/dL — ABNORMAL HIGH (ref 65–99)
Potassium: 3.3 mmol/L — ABNORMAL LOW (ref 3.5–5.1)
Sodium: 138 mmol/L (ref 135–145)

## 2015-12-04 LAB — HEPARIN LEVEL (UNFRACTIONATED): HEPARIN UNFRACTIONATED: 0.14 [IU]/mL — AB (ref 0.30–0.70)

## 2015-12-04 MED ORDER — ENOXAPARIN SODIUM 80 MG/0.8ML ~~LOC~~ SOLN: 30.0000 mg | Freq: Two times a day (BID) | SUBCUTANEOUS | 0 refills | Status: DC

## 2015-12-04 MED ORDER — HEPARIN BOLUS VIA INFUSION
2400.0000 [IU] | Freq: Once | INTRAVENOUS | Status: AC
  Administered 2015-12-04: 2400 [IU] via INTRAVENOUS
  Filled 2015-12-04: qty 2400

## 2015-12-04 MED ORDER — ENOXAPARIN (LOVENOX) PATIENT EDUCATION KIT
PACK | Freq: Once | Status: AC
  Administered 2015-12-04: 13:00:00
  Filled 2015-12-04: qty 1

## 2015-12-04 MED ORDER — WARFARIN - PHARMACIST DOSING INPATIENT: Freq: Every day | Status: DC

## 2015-12-04 MED ORDER — ENOXAPARIN SODIUM 150 MG/ML ~~LOC~~ SOLN: 1.0000 mg/kg | Freq: Two times a day (BID) | SUBCUTANEOUS | 0 refills | Status: DC

## 2015-12-04 MED ORDER — WARFARIN SODIUM 5 MG PO TABS: 10.0000 mg | ORAL_TABLET | Freq: Every day | ORAL | 0 refills | Status: DC

## 2015-12-04 MED ORDER — WARFARIN SODIUM 5 MG PO TABS
10.0000 mg | ORAL_TABLET | Freq: Once | ORAL | Status: AC
  Administered 2015-12-04: 10 mg via ORAL
  Filled 2015-12-04: qty 2

## 2015-12-04 MED ORDER — CARVEDILOL 12.5 MG PO TABS
12.5000 mg | ORAL_TABLET | Freq: Two times a day (BID) | ORAL | Status: DC
Start: 2015-12-04 — End: 2015-12-04

## 2015-12-04 MED ORDER — POTASSIUM CHLORIDE CRYS ER 20 MEQ PO TBCR
40.0000 meq | EXTENDED_RELEASE_TABLET | Freq: Once | ORAL | Status: AC
  Administered 2015-12-04: 40 meq via ORAL
  Filled 2015-12-04: qty 2

## 2015-12-04 MED ORDER — WARFARIN SODIUM 5 MG PO TABS: 5.0000 mg | ORAL_TABLET | Freq: Every day | ORAL | 0 refills | Status: DC

## 2015-12-04 MED ORDER — ENOXAPARIN SODIUM 80 MG/0.8ML ~~LOC~~ SOLN: 80.0000 mg | Freq: Two times a day (BID) | SUBCUTANEOUS | 0 refills | Status: DC

## 2015-12-04 MED ORDER — LOSARTAN POTASSIUM 50 MG PO TABS
100.0000 mg | ORAL_TABLET | Freq: Every day | ORAL | Status: DC
Start: 2015-12-04 — End: 2015-12-04
  Administered 2015-12-04: 100 mg via ORAL
  Filled 2015-12-04: qty 2

## 2015-12-04 MED ORDER — ENOXAPARIN SODIUM 80 MG/0.8ML ~~LOC~~ SOLN
1.0000 mg/kg | Freq: Two times a day (BID) | SUBCUTANEOUS | Status: DC
Start: 2015-12-04 — End: 2015-12-04
  Administered 2015-12-04: 80 mg via SUBCUTANEOUS
  Filled 2015-12-04 (×2): qty 0.8

## 2015-12-04 NOTE — Discharge Instructions (Signed)

## 2015-12-04 NOTE — Consult Note (Addendum)
ANTICOAGULATION CONSULT NOTE  Pharmacy Consult for enoxaparin, bridge to warfarin Indication: LV thrombus with emboli to Left kidney  Allergies  Allergen Reactions  . Aspirin     Other reaction(s): Other (See Comments)  . Latex Rash    Patient Measurements: Height: 6' (182.9 cm) Weight: 175 lb (79.4 kg) IBW/kg (Calculated) : 77.6 Heparin Dosing Weight: 79.4kg  Vital Signs: Temp: 97.8 F (36.6 C) (11/02 0548) Temp Source: Oral (11/02 0548) BP: 151/85 (11/02 0753) Pulse Rate: 95 (11/02 0548)  Labs:  Recent Labs  12/03/15 0740 12/03/15 0742 12/03/15 1737 12/03/15 1916 12/03/15 2356 12/04/15 0120  HGB 14.1  --   --   --   --  13.6  HCT 43.0  --   --   --   --  41.3  PLT 248  --   --   --   --  233  APTT  --  35  --   --   --   --   LABPROT  --  14.4  --   --   --   --   INR  --  1.11  --   --   --   --   HEPARINUNFRC  --   --  0.33  --   --  0.14*  CREATININE 0.97  --   --   --   --  0.67  TROPONINI 0.05*  --   --  0.05* 0.07* 0.07*    Estimated Creatinine Clearance: 133.4 mL/min (by C-G formula based on SCr of 0.67 mg/dL).   Medical History: Past Medical History:  Diagnosis Date  . Abscess    to rectum  . Hypertension     Medications:  Scheduled:  . aspirin  81 mg Oral Daily  . atorvastatin  40 mg Oral q1800  . carvedilol  12.5 mg Oral BID WC  . clindamycin  300 mg Oral TID  . losartan  100 mg Oral Daily  . potassium chloride  40 mEq Oral Once  . sodium chloride flush  3 mL Intravenous Q12H  . warfarin  10 mg Oral Once  . [START ON 12/05/2015] Warfarin - Pharmacist Dosing Inpatient   Does not apply q1800    Assessment: Pt is a 40 year old male found to have a LV thrombus with emboli to Left kidney. No home anticoagulation PTA. Baseline labs all WNL.  New consult to transition to warfarin  Goal of Therapy:  Heparin level 0.3-0.7 units/ml Monitor platelets by anticoagulation protocol: Yes  INR 2-3   Plan:  Current orders for heparin 1600  units/hr. Last heparin level subtherapeutic, follow up at HL at 12:00 today  Will begin warfarin at 10mg  x 1 today and check INR with AM labs.  11/2 update - patient to discharge home, MD would like to initiate enoxaparin as a bridge to warfarin. Heparin discontinued,  enoxaparin 80mg  SQ Q12H to start 1 hour after discontinuation of heparin drip. Will go home on warfarin 5mg  daily, conservatively dosing as not able to visit INR clinic until Monday.    Alessia Gonsalez C 12/04/2015,9:43 AM

## 2015-12-04 NOTE — Care Management (Signed)
Informed that [facility] not have Lovenox.  CM contacted [facility] and has 7 doses.  [facility] hold on [facility] script and notified [address].  Patient [facility]

## 2015-12-04 NOTE — Progress Notes (Signed)
Removed telemetry and PIV.  Given lovenox teaching.  Patient has no questions.  Discharging to home.  Esocrted out of hospital via wheelchair by volunteers.

## 2015-12-04 NOTE — Consult Note (Signed)
ANTICOAGULATION CONSULT NOTE - Initial Consult  Pharmacy Consult for heparin drip Indication: LV thrombus with emboli to Left kidney  Allergies  Allergen Reactions  . Aspirin     Other reaction(s): Other (See Comments)  . Latex Rash    Patient Measurements: Height: 6' (182.9 cm) Weight: 175 lb (79.4 kg) IBW/kg (Calculated) : 77.6 Heparin Dosing Weight: 79.4kg  Vital Signs: Temp: 98.1 F (36.7 C) (11/02 0000) Temp Source: Oral (11/02 0000) BP: 140/101 (11/02 0134) Pulse Rate: 91 (11/02 0134)  Labs:  Recent Labs  12/03/15 0740 12/03/15 0742 12/03/15 1737 12/03/15 1916 12/03/15 2356 12/04/15 0120  HGB 14.1  --   --   --   --  13.6  HCT 43.0  --   --   --   --  41.3  PLT 248  --   --   --   --  233  APTT  --  35  --   --   --   --   LABPROT  --  14.4  --   --   --   --   INR  --  1.11  --   --   --   --   HEPARINUNFRC  --   --  0.33  --   --  0.14*  CREATININE 0.97  --   --   --   --  0.67  TROPONINI 0.05*  --   --  0.05* 0.07* 0.07*    Estimated Creatinine Clearance: 133.4 mL/min (by C-G formula based on SCr of 0.67 mg/dL).   Medical History: Past Medical History:  Diagnosis Date  . Abscess    to rectum  . Hypertension     Medications:  Scheduled:  . aspirin  81 mg Oral Daily  . atorvastatin  40 mg Oral q1800  . carvedilol  6.25 mg Oral BID WC  . clindamycin  300 mg Oral TID  . heparin  2,400 Units Intravenous Once  . losartan  50 mg Oral Daily  . sodium chloride flush  3 mL Intravenous Q12H    Assessment: Pt is a 40 year old male found to have a LV thrombus with emboli to Left kidney. No home anticoagulation found on med rec. Baseline labs all WNL.  Goal of Therapy:  Heparin level 0.3-0.7 units/ml Monitor platelets by anticoagulation protocol: Yes   Plan:  11/1 1737 HL: 0.33 Heparin level at the lower end of therapeutic. Will increase infusion to 1350 units and recheck HL in 6 hours.   11/2 01:30 heparin level 0.14. 2400 unit bolus and  increase rate to 1600 units/hr. Recheck in 6 hours.  Arlone Lenhardt S 12/04/2015,5:37 AM

## 2015-12-04 NOTE — Progress Notes (Signed)
[facility] Per MD we will continue to monitor.

## 2015-12-04 NOTE — Progress Notes (Signed)
Patient: Joshua Tate / Admit Date: 12/03/2015 / Date of Encounter: 12/04/2015, 8:25 AM   Subjective: Abdominal pain much improved. No chest pain or SOB. 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 Potassium low this morning at 3.3, repletion ordered. Wants to go home.   Review of Systems: Review of Systems  Constitutional: Positive for malaise/fatigue. Negative for chills, diaphoresis, fever and weight loss.  HENT: Negative for congestion.   Eyes: Negative for discharge and redness.  Respiratory: Negative for cough, hemoptysis, sputum production, shortness of breath and wheezing.   Cardiovascular: Negative for chest pain, palpitations, orthopnea, claudication, leg swelling and PND.  Gastrointestinal: Positive for abdominal pain. Negative for blood in stool, heartburn, melena, nausea and vomiting.       ABD pain much imrpoved  Genitourinary: Negative for hematuria.  Musculoskeletal: Negative for falls and myalgias.  Skin: Negative for rash.  Neurological: Negative for dizziness, tingling, tremors, sensory change, speech change, focal weakness, loss of consciousness and weakness.  Endo/Heme/Allergies: Does not bruise/bleed easily.  Psychiatric/Behavioral: Negative for substance abuse. The patient is not nervous/anxious.   All other systems reviewed and are negative.   Objective: Telemetry: NSR, occasional PVC Physical Exam: Blood pressure (!) 151/85, pulse 95, temperature 97.8 F (36.6 C), temperature source Oral, resp. rate 18, height 6' (1.829 m), weight 175 lb (79.4 kg), SpO2 98 %. Body mass index is 23.73 kg/m. General: Well developed, well nourished, in no acute distress. Head: Normocephalic, atraumatic, sclera non-icteric, no xanthomas, nares are without discharge. Neck: Negative for carotid bruits. JVP not elevated. Lungs: Clear bilaterally to auscultation without wheezes, rales, or rhonchi. Breathing is unlabored. Heart: RRR S1 S2  without murmurs, rubs, or gallops.  Abdomen: Soft, non-tender, non-distended with normoactive bowel sounds. No rebound/guarding. Extremities: No clubbing or cyanosis. No edema. Distal pedal pulses are 2+ and equal bilaterally. Neuro: Alert and oriented X 3. Moves all extremities spontaneously. Psych:  Responds to questions appropriately with a normal affect.   Intake/Output Summary (Last 24 hours) at 12/04/15 0825 Last data filed at 12/04/15 0615  Gross per 24 hour  Intake          1346.98 ml  Output              600 ml  Net           746.98 ml    Inpatient Medications:  . aspirin  81 mg Oral Daily  . atorvastatin  40 mg Oral q1800  . carvedilol  6.25 mg Oral BID WC  . clindamycin  300 mg Oral TID  . losartan  50 mg Oral Daily  . potassium chloride  40 mEq Oral Once  . sodium chloride flush  3 mL Intravenous Q12H   Infusions:  . sodium chloride 75 mL/hr at 12/04/15 0608  . heparin 1,600 Units/hr (12/04/15 0606)    Labs:  Recent Labs  12/03/15 0740 12/04/15 0120  NA 139 138  K 4.0 3.3*  CL 104 105  CO2 25 23  GLUCOSE 153* 106*  BUN 9 9  CREATININE 0.97 0.67  CALCIUM 9.4 8.9    Recent Labs  12/03/15 0740  AST 24  ALT 19  ALKPHOS 90  BILITOT 0.9  PROT 8.7*  ALBUMIN 4.0    Recent Labs  12/03/15 0740 12/04/15 0120  WBC 12.7* 12.6*  NEUTROABS 8.4*  --   HGB 14.1 13.6  HCT 43.0 41.3  MCV 89.9 88.6  PLT 248 233  Recent Labs  12/03/15 0740 12/03/15 1916 12/03/15 2356 12/04/15 0120  TROPONINI 0.05* 0.05* 0.07* 0.07*   Invalid input(s): POCBNP No results for input(s): HGBA1C in the last 72 hours.   Weights: Filed Weights   12/03/15 0750  Weight: 175 lb (79.4 kg)     Radiology/Studies:  Dg Abdomen 1 View  Result Date: 12/03/2015 CLINICAL DATA:  Abdominal pain with nausea and vomiting EXAM: ABDOMEN - 1 VIEW COMPARISON:  CT abdomen and pelvis September 26, 2015 FINDINGS: There is moderate stool throughout the colon and rectum. There is no  bowel dilatation or air-fluid levels suggesting bowel obstruction. No free air. There is an apparent bone island in the right femoral neck. Atherosclerotic calcification is noted in the aorta. IMPRESSION: No bowel obstruction or free air.  Aortic atherosclerosis noted. Electronically Signed   By: Bretta Bang III M.D.   On: 12/03/2015 08:30   Ct Pelvis W Contrast  Result Date: 11/10/2015 CLINICAL DATA:  Scrotal and perineal abscess. EXAM: CT PELVIS WITH CONTRAST TECHNIQUE: Multidetector CT imaging of the pelvis was performed using the standard protocol following the bolus administration of intravenous contrast. CONTRAST:  ISOVUE-300 IOPAMIDOL (ISOVUE-300) INJECTION 61% COMPARISON:  CT scan of September 26, 2015. FINDINGS: Urinary Tract: Urinary bladder is decompressed. Visualized portions of lower poles of both kidneys appear normal. No definite ureteral dilatation is noted. Bowel:  There is no evidence of bowel obstruction. Vascular/Lymphatic: Atherosclerosis of abdominal aorta is noted. 3 cm infrarenal abdominal aortic aneurysm is noted. Bilateral inguinal adenopathy is noted with 3.2 x 1.5 cm lymph node seen in right inguinal region which is enlarged compared to prior exam. Reproductive:  Prostate gland appears normal. Other: Large amount of inflammatory changes are noted in the left perineal region, although no large defined fluid collection is seen in this area. The abscess noted on prior exam appears to have significantly improved. However, inflammatory changes are seen involving the scrotum, with 2.3 cm rounded low density seen in the right scrotum concerning for possible abscess. Musculoskeletal: No significant osseous abnormality is noted. IMPRESSION: 3 cm infrarenal abdominal aortic aneurysm. Recommend followup by ultrasound in 3 years. This recommendation follows ACR consensus guidelines: White Paper of the ACR Incidental Findings Committee II on Vascular Findings. J Am Coll Radiol 2013;  10:789-794. Bilateral inguinal adenopathy is noted, right greater than left, which most likely is inflammatory in origin. Extensive inflammatory changes are seen in the left perineal region, although the abscess noted on prior exam appears to be significantly improved. This is consistent with extensive cellulitis. There is noted an increased amount of inflammatory changes involving the scrotum compared to prior exam, with 2.3 cm low density seen in right scrotum concerning for possible abscess. This is new since prior exam. Electronically Signed   By: Lupita Raider, M.D.   On: 11/10/2015 09:59   Ct Angio Abd/pel W And/or Wo Contrast  Result Date: 12/03/2015 CLINICAL DATA:  Abdominal pain with nausea and vomiting since last night EXAM: CTA ABDOMEN AND PELVIS wITHOUT AND WITH CONTRAST TECHNIQUE: Multidetector CT imaging of the abdomen and pelvis was performed using the standard protocol during bolus administration of intravenous contrast. Multiplanar reconstructed images and MIPs were obtained and reviewed to evaluate the vascular anatomy. CONTRAST:  100 cc Isovue 370 COMPARISON:  11/10/2015 FINDINGS: VASCULAR Aorta: Maximal diameter of the aorta is at the level of the renal artery takeoff measuring 3.2 cm. Mild diffuse atherosclerotic calcification. Aorta is patent within the lumen. Celiac: Patent.  Branch vessels are  grossly patent. SMA: Patent. Renals: Single renal arteries are patent IMA: IMA is very diminutive but grossly patent. Inflow: Maximal right common iliac artery diameter is 2.3 cm. Internal and external iliac arteries are patent. Maximal right external iliac artery diameter is 1.1 cm. The common iliac artery is patent. Left common iliac artery maximal diameter is 1.5 cm. It is patent. Left external and internal iliac arteries are mildly ectatic and patent. Proximal Outflow: Grossly patent. Veins: Portal, splenic, superior mesenteric, hepatic, and renal veins are patent. Review of the MIP images  confirms the above findings. NON-VASCULAR Lower chest: Dependent atelectasis Hepatobiliary: Unremarkable liver and gallbladder Pancreas: Unremarkable Spleen: Unremarkable Adrenals/Urinary Tract: Adrenal glands and right kidney are unremarkable. There are wedge-shaped low-density areas throughout the left kidney. This occurs primarily in the mid left kidney, and lower pole. The largest wedge-shaped area is in the lower pole measuring 5.1 x 3.3 cm. The appearance is most consistent with multiple infarcts. Unremarkable bladder. Stomach/Bowel: No obvious mass in the colon. Lack of oral contrast limits evaluation of the bowel. No obvious evidence of small-bowel obstruction. Lymphatic: No evidence of para-aortic adenopathy. No obvious iliac adenopathy. Bilateral inguinal adenopathy has not subjectively improved. Reproductive: Associated inflammatory changes in the perineum have improved. The perineum and scrotum are only partially imaged on this study. Prostate is within normal limits. Other: No free-fluid. Musculoskeletal: No vertebral compression deformity. IMPRESSION: VASCULAR Maximal aortic diameter is 3.2 cm. Recommend followup by ultrasound in 3 years. This recommendation follows ACR consensus guidelines: White Paper of the ACR Incidental Findings Committee II on Vascular Findings. J Am Coll Radiol 2013; 10:789-794. Multiple wedge-shaped low-density defects within the left kidney most consistent with infarcts. This can be seen with vasculitis and embolic phenomenon. This is favored over an inflammatory process or infiltration by tumor. 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 Remainder of the iliac vasculature is ectatic. NON-VASCULAR Inflammatory changes in the perineum are improved. Inguinal adenopathy is stable. Electronically Signed   By: Jolaine ClickArthur  Hoss M.D.   On: 12/03/2015 09:30     Assessment and Plan  Principal Problem:   Renal infarct [facility](HCC) Active Problems:   Cardiomyopathy (HCC)   Mural  thrombus of cardiac apex   Hypertension   Diabetes mellitus (HCC)   Nausea & vomiting   Diarrhea    1. Elevated troponin: -No symptoms concerning for ischemia -Recent cardiac cath 2 weeks ago as above -Mildly elevated and flat trending likely in the setting of supply demand ischemia 2/2 #2 -On heparin gtt for renal infarct with schedule transition to Coumadin this morning per IM -No plans for further ischemic evaluation at this time unless troponin significantly trends upwards  2. Left-renal infarct: -Likely in the setting of previously presumed mural thrombus -On heparin gtt at this time, transitioning to Coumadin  3. Likely mural thrombus: -Leading to #2 -As above  4. CAD: -Continue current medical therapy -Will be warfarin in place of ASA at time of discharge  5. 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 If EF remains less than 35% at that time he would need referral to EP for evaluation of possible ICD   6. Hypertensive heart diseae with heart failure: -Increase losartan to 100 mg daily and Coreg to 12.5 mg bid  Signed, Carola FrostRyan Elynor Kallenberger, PA-C [facility] Pager: [phone] 12/04/2015, 8:25 AM

## 2015-12-04 NOTE — Discharge Summary (Signed)
[facility] [place] [facility]   PATIENT NAME: Joshua Tate    MR#:  [id number]  DATE OF BIRTH:  Jun 06, 1975  DATE OF ADMISSION:  12/03/2015 ADMITTING PHYSICIAN: Houston SirenVivek J Sainani, MD  DATE OF DISCHARGE: 12/04/2015  PRIMARY CARE PHYSICIAN: [facility]    ADMISSION DIAGNOSIS:  Renal infarct (HCC) [N28.0] Abdominal pain, unspecified abdominal location [R10.9] Diarrhea, unspecified type [R19.7] Non-intractable vomiting with nausea, unspecified vomiting type [R11.2]  DISCHARGE DIAGNOSIS:  Principal Problem:   Renal infarct (HCC) Active Problems:   Hypertension   Diabetes mellitus (HCC)   Cardiomyopathy (HCC)   Mural thrombus of cardiac apex   Nausea & vomiting   Diarrhea   SECONDARY DIAGNOSIS:   Past Medical History:  Diagnosis Date  . Abscess    to rectum  . Hypertension     HOSPITAL COURSE:   40 year old man with history of CAD and chronic systolic heart failure due to ischemic cardiomyopathy with suspected apical thrombus, hypertension, and diabetes mellitus, who [facility] with acute onset of left sided abdominal pain.  1. Left renal infarct: Patient was initially on heparin. He will be discharged on Lovenox and Coumadin.  2. Elevation in troponin: This is not due to ACS but due to demand ischemia. Cardiology evaluation appreciated.  3. Likely mural thrombus leading to problem #1: Plan as above.  4. CAD: As per cardiology patient to stop taking aspirin due to the fact he is now starting Coumadin and will continue atorvastatin, Coreg.  5. Essential hypertension: Patient will continue losartan and Coreg.  6. Recent scrotal abscess status post incision and drainage: Patient will continue on clindamycin.  DISCHARGE CONDITIONS AND DIET:   Stable for discharge on cardiac diet  CONSULTS OBTAINED:  Treatment Team:  Yvonne Kendallhristopher End, MD Annice NeedyJason S Dew, MD  DRUG ALLERGIES:   Allergies  Allergen Reactions  . Aspirin      Other reaction(s): Other (See Comments)  . Latex Rash    DISCHARGE MEDICATIONS:   Current Discharge Medication List    START taking these medications   Details  enoxaparin (LOVENOX) 80 MG/0.8ML injection Inject 0.3 mLs (30 mg total) into the skin every 12 (twelve) hours. Qty: 3 mL, Refills: 0    warfarin (COUMADIN) 5 MG tablet Take 1 tablet (5 mg total) by mouth daily. Qty: 60 tablet, Refills: 0      CONTINUE these medications which have NOT CHANGED   Details  atorvastatin (LIPITOR) 40 MG tablet Take 1 tablet (40 mg total) by mouth daily at 6 PM. Qty: 30 tablet, Refills: 0    carvedilol (COREG) 6.25 MG tablet Take 1 tablet (6.25 mg total) by mouth 2 (two) times daily with a meal. Qty: 60 tablet, Refills: 11    clindamycin (CLEOCIN) 300 MG capsule Take 1 capsule (300 mg total) by mouth 3 (three) times daily. Qty: 30 capsule, Refills: 0    losartan (COZAAR) 50 MG tablet Take 1 tablet (50 mg total) by mouth daily. Qty: 30 tablet, Refills: 11      STOP taking these medications     aspirin 81 MG chewable tablet      naproxen sodium (ANAPROX) 220 MG tablet               Today   CHIEF COMPLAINT:  Patient wants to go home abdominal pain is better   VITAL SIGNS:  Blood pressure (!) 151/85, pulse 95, temperature 97.8 F (36.6 C), temperature source Oral, resp. rate 18, height 6' (  1.829 m), weight 79.4 kg (175 lb), SpO2 98 %.   REVIEW OF SYSTEMS:  ROS   PHYSICAL EXAMINATION:  GENERAL:  40 y.o.-year-old patient lying in the bed with no acute distress.  NECK:  Supple, no jugular venous distention. No thyroid enlargement, no tenderness.  LUNGS: Normal breath sounds bilaterally, no wheezing, rales,rhonchi  No use of accessory muscles of respiration.  CARDIOVASCULAR: S1, S2 normal. No murmurs, rubs, or gallops.  ABDOMEN: Soft, non-tender, non-distended. Bowel sounds present. No organomegaly or mass.  EXTREMITIES: No pedal edema, cyanosis, or clubbing.   PSYCHIATRIC: The patient is alert and oriented x 3.  SKIN: No obvious rash, lesion, or ulcer.   DATA REVIEW:   CBC  Recent Labs Lab 12/04/15 0120  WBC 12.6*  HGB 13.6  HCT 41.3  PLT 233    Chemistries   Recent Labs Lab 12/03/15 0740 12/04/15 0120  NA 139 138  K 4.0 3.3*  CL 104 105  CO2 25 23  GLUCOSE 153* 106*  BUN 9 9  CREATININE 0.97 0.67  CALCIUM 9.4 8.9  AST 24  --   ALT 19  --   ALKPHOS 90  --   BILITOT 0.9  --     Cardiac Enzymes  Recent Labs Lab 12/03/15 1916 12/03/15 2356 12/04/15 0120  TROPONINI 0.05* 0.07* 0.07*    Microbiology Results  @[email or web address] @  RADIOLOGY:  Dg Abdomen 1 View  Result Date: 12/03/2015 CLINICAL DATA:  Abdominal pain with nausea and vomiting EXAM: ABDOMEN - 1 VIEW COMPARISON:  CT abdomen and pelvis September 26, 2015 FINDINGS: There is moderate stool throughout the colon and rectum. There is no bowel dilatation or air-fluid levels suggesting bowel obstruction. No free air. There is an apparent bone island in the right femoral neck. Atherosclerotic calcification is noted in the aorta. IMPRESSION: No bowel obstruction or free air.  Aortic atherosclerosis noted. Electronically Signed   By: Bretta Bang III M.D.   On: 12/03/2015 08:30   Ct Angio Abd/pel W And/or Wo Contrast  Result Date: 12/03/2015 CLINICAL DATA:  Abdominal pain with nausea and vomiting since last night EXAM: CTA ABDOMEN AND PELVIS wITHOUT AND WITH CONTRAST TECHNIQUE: Multidetector CT imaging of the abdomen and pelvis was performed using the standard protocol during bolus administration of intravenous contrast. Multiplanar reconstructed images and MIPs were obtained and reviewed to evaluate the vascular anatomy. CONTRAST:  100 cc Isovue 370 COMPARISON:  11/10/2015 FINDINGS: VASCULAR Aorta: Maximal diameter of the aorta is at the level of the renal artery takeoff measuring 3.2 cm. Mild diffuse atherosclerotic calcification. Aorta is patent within the lumen. Celiac:  Patent.  Branch vessels are grossly patent. SMA: Patent. Renals: Single renal arteries are patent IMA: IMA is very diminutive but grossly patent. Inflow: Maximal right common iliac artery diameter is 2.3 cm. Internal and external iliac arteries are patent. Maximal right external iliac artery diameter is 1.1 cm. The common iliac artery is patent. Left common iliac artery maximal diameter is 1.5 cm. It is patent. Left external and internal iliac arteries are mildly ectatic and patent. Proximal Outflow: Grossly patent. Veins: Portal, splenic, superior mesenteric, hepatic, and renal veins are patent. Review of the MIP images confirms the above findings. NON-VASCULAR Lower chest: Dependent atelectasis Hepatobiliary: Unremarkable liver and gallbladder Pancreas: Unremarkable Spleen: Unremarkable Adrenals/Urinary Tract: Adrenal glands and right kidney are unremarkable. There are wedge-shaped low-density areas throughout the left kidney. This occurs primarily in the mid left kidney, and lower pole. The largest wedge-shaped area is in the lower  pole measuring 5.1 x 3.3 cm. The appearance is most consistent with multiple infarcts. Unremarkable bladder. Stomach/Bowel: No obvious mass in the colon. Lack of oral contrast limits evaluation of the bowel. No obvious evidence of small-bowel obstruction. Lymphatic: No evidence of para-aortic adenopathy. No obvious iliac adenopathy. Bilateral inguinal adenopathy has not subjectively improved. Reproductive: Associated inflammatory changes in the perineum have improved. The perineum and scrotum are only partially imaged on this study. Prostate is within normal limits. Other: No free-fluid. Musculoskeletal: No vertebral compression deformity. IMPRESSION: VASCULAR Maximal aortic diameter is 3.2 cm. Recommend followup by ultrasound in 3 years. This recommendation follows ACR consensus guidelines: White Paper of the ACR Incidental Findings Committee II on Vascular Findings. J Am Coll Radiol  2013; 10:789-794. Multiple wedge-shaped low-density defects within the left kidney most consistent with infarcts. This can be seen with vasculitis and embolic phenomenon. This is favored over an inflammatory process or infiltration by tumor. 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 Remainder of the iliac vasculature is ectatic. NON-VASCULAR Inflammatory changes in the perineum are improved. Inguinal adenopathy is stable. Electronically Signed   By: Jolaine ClickArthur  Hoss M.D.   On: 12/03/2015 09:30      Management plans discussed with the patient and he is in agreement. Stable for discharge home  Patient should follow up with PCP for INR check on Monday  CODE STATUS:     Code Status Orders        Start     Ordered   12/03/15 1848  Full code  Continuous     12/03/15 1847    Code Status History    Date Active Date Inactive Code Status Order ID Comments User Context   11/17/2015 12:36 PM 11/17/2015  5:49 PM Full Code [id number]  Iran OuchMuhammad A Arida, MD Inpatient   11/10/2015  4:50 PM 11/12/2015  8:07 PM Full Code [id number]  Vanna ScotlandAshley Brandon, MD Inpatient   09/26/2015  9:42 PM 09/27/2015  5:40 AM Full Code [id number]  Ricarda Frameharles Woodham, MD Inpatient     Rounded with nursing staff and discussed with cardiology TOTAL TIME TAKING CARE OF THIS PATIENT: 38 minutes.    Note: This dictation was prepared with Dragon dictation along with smaller phrase technology. Any transcriptional errors that result from this process are unintentional.  Jnya Brossard M.D on 12/04/2015 at 11:09 AM  Between 7am to 6pm - Pager - [phone] After 6pm go to [email or web address] - Social research officer, governmentpassword [email or web address]  [facility]  Office  [phone]  CC: Primary care physician; [facility]

## 2015-12-04 NOTE — Consult Note (Signed)
[facility] Vascular Consult Note  MRN : [id number]  Joshua Tate is a 40 y.o. (Jun 30, 1975) male who presents with chief complaint of  Chief Complaint  Patient presents with  . Abdominal Pain  . Nausea  . Emesis  .  History of Present Illness: I am asked to see the patient by Dr. Cherlynn Kaiser for a renal embolus.  The patient is a cardiac patient with severe cardiomyopathy and thrombus in his heart.  He was not compliant with anticoagulation and recently left the hospital AMA.  He presents with flank pain.  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  Still with hiccups.  No fever or chills. Pain is better.  On anticoagulation now.    Current Facility-Administered Medications  Medication Dose Route Frequency Provider Last Rate Last Dose  . 0.9 %  sodium chloride infusion   Intravenous Continuous Houston Siren, MD 75 mL/hr at 12/04/15 0608    . acetaminophen (TYLENOL) tablet 650 mg  650 mg Oral Q6H PRN Houston Siren, MD       Or  . acetaminophen (TYLENOL) suppository 650 mg  650 mg Rectal Q6H PRN Houston Siren, MD      . aspirin chewable tablet 81 mg  81 mg Oral Daily Houston Siren, MD   81 mg at 12/04/15 0947  . atorvastatin (LIPITOR) tablet 40 mg  40 mg Oral q1800 Houston Siren, MD      . carvedilol (COREG) tablet 12.5 mg  12.5 mg Oral BID WC Ryan M Dunn, PA-C      . clindamycin (CLEOCIN) capsule 300 mg  300 mg Oral TID Houston Siren, MD   300 mg at 12/04/15 1421  . enoxaparin (LOVENOX) injection 80 mg  1 mg/kg Subcutaneous Q12H Jody C Barefoot, RPH   80 mg at 12/04/15 1530  . hydrALAZINE (APRESOLINE) injection 10 mg  10 mg Intravenous Q6H PRN Houston Siren, MD   10 mg at 12/04/15 0630  . HYDROcodone-acetaminophen (NORCO/VICODIN) 5-325 MG per tablet 1-2 tablet  1-2 tablet Oral Q4H PRN Houston Siren, MD   2 tablet at 12/04/15 1210  . ketorolac (TORADOL) 30 MG/ML injection 30 mg  30 mg Intravenous Q6H PRN Houston Siren, MD      . losartan (COZAAR)  tablet 100 mg  100 mg Oral Daily Raymon Mutton Dunn, PA-C   100 mg at 12/04/15 0947  . morphine 2 MG/ML injection 2 mg  2 mg Intravenous Q3H PRN Houston Siren, MD      . ondansetron (ZOFRAN) tablet 4 mg  4 mg Oral Q6H PRN Houston Siren, MD       Or  . ondansetron (ZOFRAN) injection 4 mg  4 mg Intravenous Q6H PRN Houston Siren, MD      . sodium chloride flush (NS) 0.9 % injection 3 mL  3 mL Intravenous Q12H Houston Siren, MD   3 mL at 12/03/15 2112  . [START ON 12/05/2015] Warfarin - Pharmacist Dosing Inpatient   Does not apply [id number] Adrian Saran, MD        Past Medical History:  Diagnosis Date  . Abscess    to rectum  . Hypertension   Cardiomyopathy, EF <20%  Past Surgical History:  Procedure Laterality Date  . CARDIAC CATHETERIZATION Left 11/17/2015   Procedure: Left Heart Cath and Coronary Angiography;  Surgeon: Iran Ouch, MD;  Location: [facility] INVASIVE CV LAB;  Service: Cardiovascular;  Laterality: Left;  .  EYE SURGERY    . INCISION AND DRAINAGE ABSCESS N/A 09/26/2015   Procedure: INCISION AND DRAINAGE perirectal  ABSCESS;  Surgeon: Leafy Ro, MD;  Location: [facility] ORS;  Service: General;  Laterality: N/A;  . INCISION AND DRAINAGE ABSCESS N/A 11/10/2015   Procedure: INCISION AND DRAINAGE ABSCESS;  Surgeon: Vanna Scotland, MD;  Location: [facility] ORS;  Service: Urology;  Laterality: N/A;    Social History Social History  Substance Use Topics  . Smoking status: Current Every Day Smoker    Packs/day: 2.00    Years: 20.00    Types: Cigars  . Smokeless tobacco: Never Used  . Alcohol use Yes     Comment: socially    Family History Family History  Problem Relation Age of Onset  . CAD Mother     a. MI age 50  . Hypertension Mother   . Diabetes Mellitus II Mother   . Lung cancer Maternal Grandmother   . Hematuria Neg Hx     Allergies  Allergen Reactions  . Aspirin     Other reaction(s): Other (See Comments)  . Latex Rash     REVIEW OF SYSTEMS (Negative unless  checked)  Constitutional: [] Weight loss  [] Fever  [] Chills Cardiac: [] Chest pain   [] Chest pressure   [x] Palpitations   [] Shortness of breath when laying flat   [] Shortness of breath at rest   [x] Shortness of breath with exertion. Vascular:  [] Pain in legs with walking   [] Pain in legs at rest   [] Pain in legs when laying flat   [] Claudication   [] Pain in feet when walking  [] Pain in feet at rest  [] Pain in feet when laying flat   [] History of DVT   [] Phlebitis   [] Swelling in legs   [] Varicose veins   [] Non-healing ulcers Pulmonary:   [] Uses home oxygen   [] Productive cough   [] Hemoptysis   [] Wheeze  [] COPD   [] Asthma Neurologic:  [] Dizziness  [] Blackouts   [] Seizures   [] History of stroke   [] History of TIA  [] Aphasia   [] Temporary blindness   [] Dysphagia   [] Weakness or numbness in arms   [] Weakness or numbness in legs Musculoskeletal:  [] Arthritis   [] Joint swelling   [] Joint pain   [] Low back pain Hematologic:  [] Easy bruising  [] Easy bleeding   [] Hypercoagulable state   [] Anemic  [] Hepatitis Gastrointestinal:  [] Blood in stool   [] Vomiting blood  [] Gastroesophageal reflux/heartburn   [] Difficulty swallowing. Genitourinary:  [] Chronic kidney disease   [] Difficult urination  [] Frequent urination  [] Burning with urination   [x] Blood in urine Skin:  [] Rashes   [] Ulcers   [] Wounds Psychological:  [] History of anxiety   []  History of major depression.  Physical Examination  Vitals:   12/04/15 0548 12/04/15 0640 12/04/15 0753 12/04/15 1124  BP: (!) 176/116  (!) 151/85 (!) 152/107  Pulse: 95   (!) 106  Resp: 18   17  Temp: 97.8 F (36.6 C)   97.5 F (36.4 C)  TempSrc: Oral   Oral  SpO2: (!) 88% 98%  99%  Weight:      Height:       Body mass index is 23.73 kg/m. Gen:  WD/WN, NAD. Appears older than stated age Head: [place]/AT, No temporalis wasting. Prominent temp pulse not noted. Ear/Nose/Throat: Hearing grossly intact, nares w/o erythema or drainage, oropharynx w/o Erythema/Exudate Eyes:  Sclera non-icteric, conjunctiva clear Neck: Trachea midline.  No JVD.  Pulmonary:  Good air movement, respirations not labored, equal bilaterally.  Cardiac: irregular Vascular:  Vessel Right Left  Radial Palpable Palpable                                   Gastrointestinal: mild left abdominal tenderness, no rebound or guarding Musculoskeletal: M/S 5/5 throughout.  Extremities without ischemic changes.  No deformity or atrophy. Neurologic: Sensation grossly intact in extremities.  Symmetrical.  Speech is fluent. Motor exam as listed above. Psychiatric: Judgment intact, Mood & affect appropriate for pt's clinical situation. Dermatologic: No rashes or ulcers noted.  No cellulitis or open wounds. Lymph : No Cervical, Axillary, or Inguinal lymphadenopathy.      CBC Lab Results  Component Value Date   WBC 12.6 (H) 12/04/2015   HGB 13.6 12/04/2015   HCT 41.3 12/04/2015   MCV 88.6 12/04/2015   PLT 233 12/04/2015    BMET    Component Value Date/Time   NA 138 12/04/2015 0120   NA 138 01/02/2012 0811   K 3.3 (L) 12/04/2015 0120   K 3.7 01/02/2012 0811   CL 105 12/04/2015 0120   CL 106 01/02/2012 0811   CO2 23 12/04/2015 0120   CO2 28 01/02/2012 0811   GLUCOSE 106 (H) 12/04/2015 0120   GLUCOSE 96 08/13/2013 1339   BUN 9 12/04/2015 0120   BUN 8 01/02/2012 0811   CREATININE 0.67 12/04/2015 0120   CREATININE 0.79 01/02/2012 0811   CALCIUM 8.9 12/04/2015 0120   CALCIUM 8.8 01/02/2012 0811   GFRNONAA >60 12/04/2015 0120   GFRNONAA >60 01/02/2012 0811   GFRAA >60 12/04/2015 0120   GFRAA >60 01/02/2012 0811   Estimated Creatinine Clearance: 133.4 mL/min (by C-G formula based on SCr of 0.67 mg/dL).  COAG Lab Results  Component Value Date   INR 1.11 12/03/2015    Radiology Dg Abdomen 1 View  Result Date: 12/03/2015 CLINICAL DATA:  Abdominal pain with nausea and vomiting EXAM: ABDOMEN - 1 VIEW COMPARISON:  CT abdomen and pelvis September 26, 2015 FINDINGS: There is  moderate stool throughout the colon and rectum. There is no bowel dilatation or air-fluid levels suggesting bowel obstruction. No free air. There is an apparent bone island in the right femoral neck. Atherosclerotic calcification is noted in the aorta. IMPRESSION: No bowel obstruction or free air.  Aortic atherosclerosis noted. Electronically Signed   By: Bretta Bang III M.D.   On: 12/03/2015 08:30   Ct Pelvis W Contrast  Result Date: 11/10/2015 CLINICAL DATA:  Scrotal and perineal abscess. EXAM: CT PELVIS WITH CONTRAST TECHNIQUE: Multidetector CT imaging of the pelvis was performed using the standard protocol following the bolus administration of intravenous contrast. CONTRAST:  ISOVUE-300 IOPAMIDOL (ISOVUE-300) INJECTION 61% COMPARISON:  CT scan of September 26, 2015. FINDINGS: Urinary Tract: Urinary bladder is decompressed. Visualized portions of lower poles of both kidneys appear normal. No definite ureteral dilatation is noted. Bowel:  There is no evidence of bowel obstruction. Vascular/Lymphatic: Atherosclerosis of abdominal aorta is noted. 3 cm infrarenal abdominal aortic aneurysm is noted. Bilateral inguinal adenopathy is noted with 3.2 x 1.5 cm lymph node seen in right inguinal region which is enlarged compared to prior exam. Reproductive:  Prostate gland appears normal. Other: Large amount of inflammatory changes are noted in the left perineal region, although no large defined fluid collection is seen in this area. The abscess noted on prior exam appears to have significantly improved. However, inflammatory changes are seen involving the scrotum, with 2.3 cm rounded low density seen  in the right scrotum concerning for possible abscess. Musculoskeletal: No significant osseous abnormality is noted. IMPRESSION: 3 cm infrarenal abdominal aortic aneurysm. Recommend followup by ultrasound in 3 years. This recommendation follows ACR consensus guidelines: White Paper of the ACR Incidental Findings  Committee II on Vascular Findings. J Am Coll Radiol 2013; 10:789-794. Bilateral inguinal adenopathy is noted, right greater than left, which most likely is inflammatory in origin. Extensive inflammatory changes are seen in the left perineal region, although the abscess noted on prior exam appears to be significantly improved. This is consistent with extensive cellulitis. There is noted an increased amount of inflammatory changes involving the scrotum compared to prior exam, with 2.3 cm low density seen in right scrotum concerning for possible abscess. This is new since prior exam. Electronically Signed   By: Lupita RaiderJames  Green Jr, M.D.   On: 11/10/2015 09:59   Ct Angio Abd/pel W And/or Wo Contrast  Result Date: 12/03/2015 CLINICAL DATA:  Abdominal pain with nausea and vomiting since last night EXAM: CTA ABDOMEN AND PELVIS wITHOUT AND WITH CONTRAST TECHNIQUE: Multidetector CT imaging of the abdomen and pelvis was performed using the standard protocol during bolus administration of intravenous contrast. Multiplanar reconstructed images and MIPs were obtained and reviewed to evaluate the vascular anatomy. CONTRAST:  100 cc Isovue 370 COMPARISON:  11/10/2015 FINDINGS: VASCULAR Aorta: Maximal diameter of the aorta is at the level of the renal artery takeoff measuring 3.2 cm. Mild diffuse atherosclerotic calcification. Aorta is patent within the lumen. Celiac: Patent.  Branch vessels are grossly patent. SMA: Patent. Renals: Single renal arteries are patent IMA: IMA is very diminutive but grossly patent. Inflow: Maximal right common iliac artery diameter is 2.3 cm. Internal and external iliac arteries are patent. Maximal right external iliac artery diameter is 1.1 cm. The common iliac artery is patent. Left common iliac artery maximal diameter is 1.5 cm. It is patent. Left external and internal iliac arteries are mildly ectatic and patent. Proximal Outflow: Grossly patent. Veins: Portal, splenic, superior mesenteric,  hepatic, and renal veins are patent. Review of the MIP images confirms the above findings. NON-VASCULAR Lower chest: Dependent atelectasis Hepatobiliary: Unremarkable liver and gallbladder Pancreas: Unremarkable Spleen: Unremarkable Adrenals/Urinary Tract: Adrenal glands and right kidney are unremarkable. There are wedge-shaped low-density areas throughout the left kidney. This occurs primarily in the mid left kidney, and lower pole. The largest wedge-shaped area is in the lower pole measuring 5.1 x 3.3 cm. The appearance is most consistent with multiple infarcts. Unremarkable bladder. Stomach/Bowel: No obvious mass in the colon. Lack of oral contrast limits evaluation of the bowel. No obvious evidence of small-bowel obstruction. Lymphatic: No evidence of para-aortic adenopathy. No obvious iliac adenopathy. Bilateral inguinal adenopathy has not subjectively improved. Reproductive: Associated inflammatory changes in the perineum have improved. The perineum and scrotum are only partially imaged on this study. Prostate is within normal limits. Other: No free-fluid. Musculoskeletal: No vertebral compression deformity. IMPRESSION: VASCULAR Maximal aortic diameter is 3.2 cm. Recommend followup by ultrasound in 3 years. This recommendation follows ACR consensus guidelines: White Paper of the ACR Incidental Findings Committee II on Vascular Findings. J Am Coll Radiol 2013; 10:789-794. Multiple wedge-shaped low-density defects within the left kidney most consistent with infarcts. This can be seen with vasculitis and embolic phenomenon. This is favored over an inflammatory process or infiltration by tumor. 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 Remainder of the iliac vasculature is ectatic. NON-VASCULAR Inflammatory changes in the perineum are improved. Inguinal adenopathy is stable. Electronically Signed  By: Jolaine ClickArthur  Hoss M.D.   On: 12/03/2015 09:30      Assessment/Plan 1. 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  No vascular surgery intervention of benefit.  Arteries are reasonably clean and this was clearly embolic.  Anticoagulation and medical management as outlined by primary service 2. Cardiomyopathy.  Cardiology has seen.  Likely the cause of the embolus 3. HTN. blood pressure control important in reducing the progression of atherosclerotic disease. On appropriate oral medications.    Festus BarrenJason Huntleigh Doolen, MD  12/04/2015 3:19 PM    This note was created with Dragon medical transcription system.  Any error is purely unintentional

## 2015-12-04 NOTE — Care Management (Signed)
Informed that patient is to discharge home on Lovenox.  Patient has active medicaid .  Discussed with attending that script has to be written for the brand name.  Discussed he is not eligible for [facility] as he has a payor and has a pcp at [facility].  Discussed with primary nurse of the need to to instruct patient on injection technique.

## 2015-12-08 ENCOUNTER — Ambulatory Visit (INDEPENDENT_AMBULATORY_CARE_PROVIDER_SITE_OTHER): Payer: Medicaid Other | Admitting: Urology

## 2015-12-08 ENCOUNTER — Encounter: Payer: Self-pay | Admitting: Urology

## 2015-12-08 VITALS — BP 137/90 | HR 89 | Temp 98.0°F | Ht 72.0 in | Wt 174.2 lb

## 2015-12-08 DIAGNOSIS — L02215 Cutaneous abscess of perineum: Secondary | ICD-10-CM

## 2015-12-08 DIAGNOSIS — N492 Inflammatory disorders of scrotum: Secondary | ICD-10-CM

## 2015-12-08 NOTE — Progress Notes (Signed)
12/08/2015 2:12 PM   Adonis Housekeeper 19-Mar-1975 [id number]  Referring provider: [facility] [address] [place], [place] [address]  Chief Complaint  Patient presents with  . Follow-up    wound check scrotal abscess    HPI: Patient is a 40 year old African American male who is s/p I & D of a scrotal and perineal abscess on 11/10/2015 by Dr. Apolinar Junes and Dr. Aleen Campi.  He presents today for a follow up visit and a rechecking of his wounds.  He missed his last follow up appointment one week ago due to illness.  He states he has not follow up with general surgery.  He has recently been diagnosed with a renal infarct and has been placed on Coumadin.  He states that he has had another abscess present over the weekend.  He stated he had some drainage and the pain was so intense that he almost sought treatment in the ED.  He has not had difficulty urinating or with bowel movements.    He has not had a fever, chills, nausea or vomiting.  He is afebrile at this visit.    He did undergo heart cath on 11/17/2015 and the findings were for    Dist LAD lesion, 100 %stenosed.  Mid LAD to Dist LAD lesion, 30 %stenosed.  Dist Cx lesion, 40 %stenosed.  Ost 2nd Diag to 2nd Diag lesion, 100 %stenosed.  Ost 1st Mrg to 1st Mrg lesion, 70 %stenosed.  LV end diastolic pressure is moderately elevated.   1. Severe one-vessel coronary artery disease with occluded distal LAD with right-to-left collaterals from the right coronary artery. Also occluded second diagonal with faint collaterals. Borderline disease in OM1.  2. Moderately elevated left ventricular end-diastolic pressure. Left ventricular angiography was not diagnostic and I elected not to do power injection given suspected LV thrombus.   PMH: Past Medical History:  Diagnosis Date  . Abscess    to rectum  . Hypertension     Surgical History: Past Surgical History:  Procedure Laterality Date  . CARDIAC CATHETERIZATION  Left 11/17/2015   Procedure: Left Heart Cath and Coronary Angiography;  Surgeon: Iran Ouch, MD;  Location: [facility] INVASIVE CV LAB;  Service: Cardiovascular;  Laterality: Left;  . EYE SURGERY    . INCISION AND DRAINAGE ABSCESS N/A 09/26/2015   Procedure: INCISION AND DRAINAGE perirectal  ABSCESS;  Surgeon: Leafy Ro, MD;  Location: [facility] ORS;  Service: General;  Laterality: N/A;  . INCISION AND DRAINAGE ABSCESS N/A 11/10/2015   Procedure: INCISION AND DRAINAGE ABSCESS;  Surgeon: Vanna Scotland, MD;  Location: [facility] ORS;  Service: Urology;  Laterality: N/A;    Home Medications:    Medication List       Accurate as of 12/08/15  2:12 PM. Always use your most recent med list.          atorvastatin 40 MG tablet Commonly known as:  LIPITOR Take 1 tablet (40 mg total) by mouth daily at 6 PM.   carvedilol 6.25 MG tablet Commonly known as:  COREG Take 1 tablet (6.25 mg total) by mouth 2 (two) times daily with a meal.   clindamycin 300 MG capsule Commonly known as:  CLEOCIN Take 1 capsule (300 mg total) by mouth 3 (three) times daily.   enoxaparin 80 MG/0.8ML injection Commonly known as:  LOVENOX Inject 0.8 mLs (80 mg total) into the skin every 12 (twelve) hours.   losartan 50 MG tablet Commonly known as:  COZAAR Take  1 tablet (50 mg total) by mouth daily.   warfarin 5 MG tablet Commonly known as:  COUMADIN Take 1 tablet (5 mg total) by mouth daily.       Allergies:  Allergies  Allergen Reactions  . Aspirin     Other reaction(s): Other (See Comments)  . Latex Rash    Family History: Family History  Problem Relation Age of Onset  . CAD Mother     a. MI age 41  . Hypertension Mother   . Diabetes Mellitus II Mother   . Kidney disease Mother     Dialysis  . Lung cancer Maternal Grandmother   . Prostate cancer Paternal Grandfather   . Hematuria Neg Hx     Social History:  reports that he has been smoking Cigars.  He has a 40.00 pack-year smoking history. He has  never used smokeless tobacco. He reports that he drinks alcohol. He reports that he uses drugs, including Marijuana, about 7 times per week.  ROS: UROLOGY Frequent Urination?: Yes Hard to postpone urination?: Yes Burning/pain with urination?: No Get up at night to urinate?: Yes Leakage of urine?: No Urine stream starts and stops?: Yes Trouble starting stream?: Yes Do you have to strain to urinate?: No Blood in urine?: Yes Urinary tract infection?: Yes Sexually transmitted disease?: Yes Injury to kidneys or bladder?: No Painful intercourse?: No Weak stream?: No Erection problems?: Yes Penile pain?: No  Gastrointestinal Nausea?: No Vomiting?: No Indigestion/heartburn?: No Diarrhea?: No Constipation?: No  Constitutional Fever: No Night sweats?: No Weight loss?: No Fatigue?: No  Skin Skin rash/lesions?: Yes Itching?: Yes  Eyes Blurred vision?: Yes Double vision?: No  Ears/Nose/Throat Sore throat?: No Sinus problems?: Yes  Hematologic/Lymphatic Swollen glands?: No Easy bruising?: Yes  Cardiovascular Leg swelling?: No Chest pain?: Yes  Respiratory Cough?: Yes Shortness of breath?: Yes  Endocrine Excessive thirst?: Yes  Musculoskeletal Back pain?: Yes Joint pain?: Yes  Neurological Headaches?: Yes Dizziness?: Yes  Psychologic Depression?: Yes Anxiety?: Yes  Physical Exam: BP 137/90   Pulse 89   Temp 98 F (36.7 C) (Oral)   Ht 6' (1.829 m)   Wt 174 lb 3.2 oz (79 kg)   BMI 23.63 kg/m   Constitutional: Well nourished. Alert and oriented, No acute distress. HEENT:  AT, moist mucus membranes. Trachea midline, no masses. Cardiovascular: No clubbing, cyanosis, or edema. Respiratory: Normal respiratory effort, no increased work of breathing. GI: Abdomen is soft, non tender, non distended, no abdominal masses. Liver and spleen not palpable.  No hernias appreciated.  Stool sample for occult testing is not indicated.   GU: No CVA tenderness.   No bladder fullness or masses.  Patient with circumcised phallus.   Urethral meatus is patent.  No penile discharge. No penile lesions or rashes. Scrotum wound with granulation tissue.  No erythema.  No fluctuance or crepitus noted.   Scant pus expressed with palpation.   Testicles are located scrotally bilaterally. No masses are appreciated in the testicles. Left and right epididymis are normal. Skin: 7 cm x 8 cm left perianal abscess with a 5 mm wound, tender to palpation, no pus drainage with palpation, no crepitus, no erythema with fluctuance.  Healed abscess in the right.  Penrose drain was removed by patient.   Lymph: No cervical or inguinal adenopathy. Neurologic: Grossly intact, no focal deficits, moving all 4 extremities. Psychiatric: Normal mood and affect.  Laboratory Data: Lab Results  Component Value Date   WBC 12.6 (H) 12/04/2015   HGB 13.6 12/04/2015  HCT 41.3 12/04/2015   MCV 88.6 12/04/2015   PLT 233 12/04/2015    Lab Results  Component Value Date   CREATININE 0.67 12/04/2015    Lab Results  Component Value Date   HGBA1C 5.7 (H) 11/11/2015       Component Value Date/Time   CHOL 104 11/11/2015 0235   HDL 42 11/11/2015 0235   CHOLHDL 2.5 11/11/2015 0235   VLDL 8 11/11/2015 0235   LDLCALC 54 11/11/2015 0235    Lab Results  Component Value Date   AST 24 12/03/2015   Lab Results  Component Value Date   ALT 19 12/03/2015     Assessment & Plan:    1. Scrotal abscess  - wound with granulation tissue  - scant purulent drainage, erythema, fluctuance or crepitus  - RTC in one week for wound recheck- pending perianal abscess drainage  - Discussed the warning signs with the patient and advised him to seek care in the ED if he experiences uncontrollable pain, swelling, fevers or chills  2. Perianal abscess  - Patient is instructed on warning signs for the abscess  - Patient has an appointment with Dr. Excell Seltzerooper in the morning   Return in about 1 week (around  12/15/2015) for wound recheck.  These notes generated with voice recognition software. I apologize for typographical errors.  Michiel CowboySHANNON Alegandra Sommers, PA-C  [facility] [address], [address] [place], [place] [address] [phone]

## 2015-12-09 ENCOUNTER — Ambulatory Visit: Payer: Medicaid Other | Admitting: Surgery

## 2015-12-10 ENCOUNTER — Encounter: Payer: Self-pay | Admitting: Surgery

## 2015-12-10 ENCOUNTER — Ambulatory Visit (INDEPENDENT_AMBULATORY_CARE_PROVIDER_SITE_OTHER): Payer: Medicaid Other | Admitting: Surgery

## 2015-12-10 ENCOUNTER — Other Ambulatory Visit
Admission: RE | Admit: 2015-12-10 | Discharge: 2015-12-10 | Disposition: A | Discharge status: Home / Self Care | Payer: Medicaid Other | Source: Ambulatory Visit | Attending: Surgery | Admitting: Surgery

## 2015-12-10 VITALS — BP 146/100 | HR 84 | Temp 97.9°F | Ht 78.0 in | Wt 176.0 lb

## 2015-12-10 DIAGNOSIS — K611 Rectal abscess: Secondary | ICD-10-CM

## 2015-12-10 DIAGNOSIS — K610 Anal abscess: Secondary | ICD-10-CM | POA: Diagnosis present

## 2015-12-10 MED ORDER — OXYCODONE-ACETAMINOPHEN 5-325 MG PO TABS: 1.0000 | ORAL_TABLET | ORAL | 0 refills | Status: DC | PRN

## 2015-12-10 NOTE — Patient Instructions (Signed)
Please see your follow up appointment with Dr.Cooper. Please hold off on showering until after your appointment with Dr.Cooper tomorrow. Please call our office if you have any questions or concerns.

## 2015-12-10 NOTE — Progress Notes (Signed)
Joshua HousekeeperJohn G Tate is an 40 y.o. male.   Chief Complaint: Recurrent perianal abscess HPI: Is a patient with a recurrent perianal abscess. He's had multiple I&D's in the past both by Dr. Apolinar JunesBrandon in by Dr. Aleen CampiPiscoya. He presents today after no showing for his office visit yesterday with increasing pain over the last 3 days and swelling.  Past Medical History:  Diagnosis Date  . Abscess    to rectum  . Hypertension     Past Surgical History:  Procedure Laterality Date  . CARDIAC CATHETERIZATION Left 11/17/2015   Procedure: Left Heart Cath and Coronary Angiography;  Surgeon: Iran OuchMuhammad A Arida, MD;  Location: [facility] INVASIVE CV LAB;  Service: Cardiovascular;  Laterality: Left;  . EYE SURGERY    . INCISION AND DRAINAGE ABSCESS N/A 09/26/2015   Procedure: INCISION AND DRAINAGE perirectal  ABSCESS;  Surgeon: Leafy Roiego F Pabon, MD;  Location: [facility] ORS;  Service: General;  Laterality: N/A;  . INCISION AND DRAINAGE ABSCESS N/A 11/10/2015   Procedure: INCISION AND DRAINAGE ABSCESS;  Surgeon: Vanna ScotlandAshley Brandon, MD;  Location: [facility] ORS;  Service: Urology;  Laterality: N/A;    Family History  Problem Relation Age of Onset  . CAD Mother     a. MI age 41  . Hypertension Mother   . Diabetes Mellitus II Mother   . Kidney disease Mother     Dialysis  . Lung cancer Maternal Grandmother   . Prostate cancer Paternal Grandfather   . Hematuria Neg Hx    Social History:  reports that he has been smoking Cigars.  He has a 40.00 pack-year smoking history. He has never used smokeless tobacco. He reports that he drinks alcohol. He reports that he uses drugs, including Marijuana, about 7 times per week.  Allergies:  Allergies  Allergen Reactions  . Aspirin     Other reaction(s): Other (See Comments)  . Latex Rash     (Not in a hospital admission)   Review of Systems:   Review of Systems  Constitutional: Negative for chills and fever.  Eyes: Negative.   Respiratory: Negative.   Cardiovascular: Negative.    Genitourinary: Negative for dysuria and urgency.    Physical Exam:  Physical Exam  Constitutional: He is well-developed, well-nourished, and in no distress. No distress.  Genitourinary: Penis normal. No discharge found.  Genitourinary Comments: Multiple scars and tracks in the scrotum especially on the left side and in the perianal area. Beneath an existing scar there is some fluctuance. With some mild erythema present as well.  Skin: He is not diaphoretic.  Vitals reviewed.   Blood pressure (!) 146/100, pulse 84, temperature 97.9 F (36.6 C), temperature source Oral, height 6\' 6"  (1.981 m), weight 176 lb (79.8 kg).    No results found for this or any previous visit (from the past 48 hour(s)). No results found.   Assessment/Plan Recurrent perianal abscess in the same site as previously drained. My plan would be to place a local anesthetic and open this area again. Patient is on Coumadin as well as Lovenox for thrombosis. I discussed with him the risk of bleeding and the need for transfer to the hospital should bleeding be encountered also the risk of recurrence and he is currently on antibiotics. He understood and agreed with this plan.  Patient was prepped and local anesthetic was infiltrated into the skin and subcutaneous tissues tissues around the previous incision.  After obtaining informed consent and having the patient with a local anesthetic in place the old incision  was opened with an 11 blade and then a clamp was placed to open the pus pocket purulence exuded and was cultured. No further purulence was expressible and a quarter-inch Nu Gauze was placed down into the cavity.  A dry dressing was placed and we will see him back tomorrow and remove the gauze pad I will refill his Percocet 5 mg per indices he thought the higher dose was too much for him). He is on antibiotics. Lattie Hawichard E Braylen Denunzio, MD, FACS

## 2015-12-11 ENCOUNTER — Encounter: Payer: Self-pay | Admitting: Surgery

## 2015-12-12 ENCOUNTER — Ambulatory Visit (INDEPENDENT_AMBULATORY_CARE_PROVIDER_SITE_OTHER): Payer: Medicaid Other | Admitting: Surgery

## 2015-12-12 ENCOUNTER — Encounter: Payer: Self-pay | Admitting: Surgery

## 2015-12-12 VITALS — BP 142/90 | HR 82 | Temp 98.3°F | Wt 176.0 lb

## 2015-12-12 DIAGNOSIS — Z09 Encounter for follow-up examination after completed treatment for conditions other than malignant neoplasm: Secondary | ICD-10-CM

## 2015-12-12 NOTE — Progress Notes (Signed)
S/p recurrent perianal abscess, Dr. Excell Seltzerooper did an I/D two days ago and apparently left a gauze pad for dressing .  Pt is non compliant , did not show up to his appt yesterday w Dr. Excell Seltzerooper  Pt seen and examined ,there some mild induration but no abscess or perineal infection. No necrosis, I inspected the perineal area twice and could not visualize the gauze pad  A/P doing well RTC Dr. Excell Seltzerooper for further wound check Advice smoking cessation No surgical indication

## 2015-12-12 NOTE — Patient Instructions (Signed)

## 2015-12-13 LAB — AEROBIC CULTURE  (SUPERFICIAL SPECIMEN): CULTURE: NORMAL

## 2015-12-13 LAB — AEROBIC CULTURE W GRAM STAIN (SUPERFICIAL SPECIMEN)

## 2015-12-15 ENCOUNTER — Telehealth: Payer: Self-pay

## 2015-12-15 NOTE — Telephone Encounter (Signed)
Notified patient of Aerobic culture results at this time. Patient reminded of follow up appointment on 12/31/15.

## 2015-12-16 ENCOUNTER — Encounter: Payer: Self-pay | Admitting: Surgery

## 2015-12-17 ENCOUNTER — Encounter: Payer: Self-pay | Admitting: Cardiovascular Disease

## 2015-12-17 ENCOUNTER — Ambulatory Visit (INDEPENDENT_AMBULATORY_CARE_PROVIDER_SITE_OTHER): Payer: Medicaid Other | Admitting: Cardiovascular Disease

## 2015-12-17 ENCOUNTER — Ambulatory Visit (INDEPENDENT_AMBULATORY_CARE_PROVIDER_SITE_OTHER): Payer: Medicaid Other

## 2015-12-17 VITALS — BP 160/100 | HR 69 | Ht 72.0 in | Wt 178.0 lb

## 2015-12-17 DIAGNOSIS — I513 Intracardiac thrombosis, not elsewhere classified: Secondary | ICD-10-CM

## 2015-12-17 DIAGNOSIS — E1159 Type 2 diabetes mellitus with other circulatory complications: Secondary | ICD-10-CM | POA: Diagnosis not present

## 2015-12-17 DIAGNOSIS — I10 Essential (primary) hypertension: Secondary | ICD-10-CM | POA: Diagnosis not present

## 2015-12-17 DIAGNOSIS — I255 Ischemic cardiomyopathy: Secondary | ICD-10-CM | POA: Diagnosis not present

## 2015-12-17 DIAGNOSIS — Z5181 Encounter for therapeutic drug level monitoring: Secondary | ICD-10-CM | POA: Diagnosis not present

## 2015-12-17 DIAGNOSIS — I200 Unstable angina: Secondary | ICD-10-CM

## 2015-12-17 LAB — POCT INR: INR: 2.8

## 2015-12-17 MED ORDER — LOSARTAN POTASSIUM 100 MG PO TABS
100.0000 mg | ORAL_TABLET | Freq: Every day | ORAL | 11 refills | Status: DC
Start: 2015-12-17 — End: 2016-05-27

## 2015-12-17 MED ORDER — CARVEDILOL 12.5 MG PO TABS: 12.5000 mg | ORAL_TABLET | Freq: Two times a day (BID) | ORAL | 11 refills | Status: DC

## 2015-12-17 NOTE — Patient Instructions (Signed)

## 2015-12-17 NOTE — Patient Instructions (Addendum)
Medication Instructions:   Please stay on the coreg 12.5 mg twice a day Stay on the losartan 100 mg one a day  Labwork:  No new labs needed  Testing/Procedures:  No further testing at this time   I recommend watching educational videos on topics of interest to you at:       [email or web address]  Enter code: HEARTCARE    Follow-Up: It was a pleasure seeing you in the office today. Please call us if you have new issues that need to be addressed before your next appt.  [phone]  Your physician wants you to follow-up in: 2 month.    If you need a refill on your cardiac medications before your next appointment, please call your pharmacy.

## 2015-12-17 NOTE — Progress Notes (Signed)
Cardiology Office Note  Date:  12/17/2015   ID:  Joshua Tate, DOB 03-Oct-1975, MRN [id number]  [facility]   Chief Complaint  Patient presents with  . post hospital follow up..Chest Pain    HPI:  40 y.o. male with h/o recurrent perirectal and scrotal abscesses, HTN, and DM who [facility] for evaluation of drainage from his scrotum and perineum, post-operative course  noted EKG changes along the anterolateral leads, Echocardiogram showing ejection fraction 20% with regions of wall motion abnormality,  patient had noted some increased fatigue. Follow-up cardiac catheterization10/16/2017, dr. Shirlee LimerickArida   Dist LAD lesion, 100 %stenosed.  Mid LAD to Dist LAD lesion, 30 %stenosed.  Dist Cx lesion, 40 %stenosed.  Ost 2nd Diag to 2nd Diag lesion, 100 %stenosed.  Ost 1st Mrg to 1st Mrg lesion, 70 %stenosed.  LV end diastolic pressure is moderately elevated.  Medical management recommended for occluded distal LAD with right-to-left collaterals from the right coronary artery. Also occluded second diagonal with faint collaterals. Borderline disease in OM1.  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  PSH:    Past Surgical History:  Procedure Laterality Date  . CARDIAC CATHETERIZATION Left 11/17/2015   Procedure: Left Heart Cath and Coronary Angiography;  Surgeon: Iran OuchMuhammad A Arida, MD;  Location: [facility] INVASIVE CV LAB;  Service: Cardiovascular;  Laterality: Left;  . EYE SURGERY    . INCISION AND DRAINAGE ABSCESS N/A 09/26/2015   Procedure: INCISION AND DRAINAGE perirectal  ABSCESS;  Surgeon: Leafy Roiego F Pabon, MD;  Location: [facility] ORS;  Service: General;  Laterality: N/A;  . INCISION AND DRAINAGE ABSCESS N/A 11/10/2015   Procedure: INCISION AND DRAINAGE ABSCESS;  Surgeon: Vanna ScotlandAshley Brandon, MD;  Location: [facility] ORS;  Service: Urology;  Laterality: N/A;    Current Outpatient Prescriptions  Medication Sig Dispense Refill  . atorvastatin (LIPITOR) 40 MG tablet Take 1 tablet (40 mg total) by mouth daily at 6 PM. 30 tablet 0  . carvedilol (COREG) 12.5 MG tablet Take 1 tablet (12.5 mg total) by mouth 2 (two) times daily with a meal. 60 tablet 11  . losartan (COZAAR) 100 MG tablet Take 1 tablet (100 mg total) by mouth daily. 30 tablet 11  . clindamycin (CLEOCIN) 300 MG capsule Take 300 mg by mouth 3 (three) times daily.    Marland Kitchen. warfarin (COUMADIN) 5 MG tablet Take 1 tablet (5 mg total) by mouth daily. 90 tablet 3   No current facility-administered medications for this visit.      Allergies:   Aspirin and Latex   Social History:  The patient  reports that he has been smoking Cigars.  He has a 40.00 pack-year smoking history. He  has never used smokeless tobacco. He reports that he drinks alcohol. He reports that he uses drugs, including Marijuana, about 7 times per week.   Family History:   family history includes CAD in his mother; Diabetes Mellitus II in his mother; Hypertension in his mother; Kidney disease in his mother; Lung cancer in his maternal grandmother; Prostate cancer in his paternal grandfather.    Review of  Systems: Review of Systems  Constitutional: Negative.   Respiratory: Positive for shortness of breath.   Cardiovascular: Negative.   Gastrointestinal: Negative.   Musculoskeletal: Negative.   Neurological: Negative.   Psychiatric/Behavioral: Negative.   All other systems reviewed and are negative.    PHYSICAL EXAM: VS:  BP (!) 160/100   Pulse 69   Ht 6' (1.829 m)   Wt 178 lb (80.7 kg)   BMI 24.14 kg/m  , BMI Body mass index is 24.14 kg/m. GEN: Well nourished, well developed, in no acute distress  HEENT: normal  Neck: no JVD, carotid bruits, or masses Cardiac: RRR; no murmurs, rubs, or gallops,no edema  Respiratory:  clear to auscultation bilaterally, normal work of breathing GI: soft, nontender, nondistended, + BS MS: no deformity or atrophy  Skin: warm and dry, no rash Neuro:  Strength and sensation are intact  Psych: euthymic mood, full affect    Recent Labs: 12/03/2015: ALT 19 12/04/2015: BUN 9; Creatinine, Ser 0.67; Hemoglobin 13.6; Platelets 233; Potassium 3.3; Sodium 138    Lipid Panel Lab Results  Component Value Date   CHOL 104 11/11/2015   HDL 42 11/11/2015   LDLCALC 54 11/11/2015   TRIG 38 11/11/2015      Wt Readings from Last 3 Encounters:  12/17/15 178 lb (80.7 kg)  12/12/15 176 lb (79.8 kg)  12/10/15 176 lb (79.8 kg)       ASSESSMENT AND PLAN:  Ischemic cardiomyopathy - Plan: EKG 12-Lead Recent catheterization, medical management recommended Recommended he start losartan, carvedilol Doses were increased in the hospital but not increased on his discharge paperwork Prescription sent in for carvedilol 12.5 mill grams twice a day, losartan 100 mg daily  In follow-up we'll confirm he has his medications, if not may need to contact medical management first distance  Hypertension, unspecified type - Plan: EKG 12-Lead Blood pressure elevated Long discussion concerning his blood pressure Unclear financial issues are limiting his medication  compliance  Type 2 diabetes mellitus with other circulatory complication, without long-term current use of insulin (HCC)  A1C 5.7  Abscess of scrotum Reports is well healed  Perirectal abscess He has followup with urology   Total encounter time more than 25 minutes  Greater than 50% was spent in counseling and coordination of care with the patient    Disposition:   F/U  2 months   Orders Placed This Encounter  Procedures  . EKG 12-Lead     Signed, Dossie Arbourim Gollan, M.D., Ph.D. 12/17/2015  [facility], [place] [phone]

## 2015-12-18 ENCOUNTER — Other Ambulatory Visit: Payer: Self-pay | Admitting: *Deleted

## 2015-12-18 ENCOUNTER — Telehealth: Payer: Self-pay | Admitting: *Deleted

## 2015-12-18 NOTE — Telephone Encounter (Signed)
Pt requiring PA for Losartan 100 mg tablet.  I spoke to [place] tracks to initiate PA . [phone] PA# [id number] XLK#[id number]#[id number] Awaiting Approval contact [place] tracks in 24 hrs to determine if patient has been approved.

## 2015-12-23 NOTE — Telephone Encounter (Signed)
Pt has been approved for Losartan 100 mg tablet until 04/11/16.

## 2015-12-31 ENCOUNTER — Ambulatory Visit (INDEPENDENT_AMBULATORY_CARE_PROVIDER_SITE_OTHER): Payer: Medicaid Other | Admitting: Surgery

## 2015-12-31 ENCOUNTER — Encounter: Payer: Self-pay | Admitting: Surgery

## 2015-12-31 ENCOUNTER — Ambulatory Visit (INDEPENDENT_AMBULATORY_CARE_PROVIDER_SITE_OTHER): Payer: Medicaid Other

## 2015-12-31 VITALS — BP 154/101 | HR 85 | Temp 98.2°F | Ht 72.0 in | Wt 180.4 lb

## 2015-12-31 DIAGNOSIS — I513 Intracardiac thrombosis, not elsewhere classified: Secondary | ICD-10-CM

## 2015-12-31 DIAGNOSIS — Z5181 Encounter for therapeutic drug level monitoring: Secondary | ICD-10-CM

## 2015-12-31 DIAGNOSIS — K611 Rectal abscess: Secondary | ICD-10-CM | POA: Diagnosis not present

## 2015-12-31 LAB — POCT INR: INR: 3.4

## 2015-12-31 MED ORDER — CEPHALEXIN 500 MG PO CAPS: 500.0000 mg | ORAL_CAPSULE | Freq: Four times a day (QID) | ORAL | 0 refills | Status: DC

## 2015-12-31 NOTE — Progress Notes (Signed)
Outpatient Surgical Follow Up  12/31/2015  Joshua Tate is an 40 y.o. male.   CC: Recurrent perineal abscess  HPI: This a patient who is had multiple abscesses I&D. He has no pain at this point but is having drainage. He has no fevers or chills. He points to the area on the side of his scrotum where he has a complex scar and in his groin crease.  Past Medical History:  Diagnosis Date  . Abscess    to rectum  . Hypertension     Past Surgical History:  Procedure Laterality Date  . CARDIAC CATHETERIZATION Left 11/17/2015   Procedure: Left Heart Cath and Coronary Angiography;  Surgeon: Iran OuchMuhammad A Arida, MD;  Location: [facility] INVASIVE [facility] LAB;  Service: Cardiovascular;  Laterality: Left;  . EYE SURGERY    . INCISION AND DRAINAGE ABSCESS N/A 09/26/2015   Procedure: INCISION AND DRAINAGE perirectal  ABSCESS;  Surgeon: Leafy Roiego F Pabon, MD;  Location: [facility] ORS;  Service: General;  Laterality: N/A;  . INCISION AND DRAINAGE ABSCESS N/A 11/10/2015   Procedure: INCISION AND DRAINAGE ABSCESS;  Surgeon: Vanna ScotlandAshley Brandon, MD;  Location: [facility] ORS;  Service: Urology;  Laterality: N/A;    Family History  Problem Relation Age of Onset  . CAD Mother     a. MI age 41  . Hypertension Mother   . Diabetes Mellitus II Mother   . Kidney disease Mother     Dialysis  . Lung cancer Maternal Grandmother   . Prostate cancer Paternal Grandfather   . Hematuria Neg Hx     Social History:  reports that he has been smoking Cigars.  He has a 40.00 pack-year smoking history. He has never used smokeless tobacco. He reports that he drinks alcohol. He reports that he uses drugs, including Marijuana, about 7 times per week.  Allergies:  Allergies  Allergen Reactions  . Aspirin     Other reaction(s): Other (See Comments)  . Latex Rash    Medications reviewed.   Review of Systems:   Review of Systems  Constitutional: Negative for chills and fever.     Physical Exam:  BP (!) 154/101   Pulse 85   Temp  98.2 F (36.8 C) (Oral)   Ht 6' (1.829 m)   Wt 180 lb 6.4 oz (81.8 kg)   BMI 24.47 kg/m   Physical Exam  Constitutional: He is well-developed, well-nourished, and in no distress. No distress.  Abdominal: Soft. There is no tenderness.  Skin: Skin is warm. No rash noted. He is not diaphoretic. No erythema.  Complex scar the left side of the scrotum and groin. There is some minimal drainage that is suggestive of hidradenitis no sign of erythema and nontender  Vitals reviewed.     No results found for this or any previous visit (from the past 48 hour(s)). No results found.  Assessment/Plan:  Probable hidradenitis suppurativa of the left groin he also has this complex wound which is mostly healed. I cannot see an obvious drainage site but it is likely emanating from this hidradenitis area I certainly see no sign of surgical necessity at this point and will start him on some Keflex to follow-up in our office next week with Dr. Aleen CampiPiscoya, his operating surgeon.  Lattie Hawichard E Kiel Cockerell, MD, FACS

## 2015-12-31 NOTE — Patient Instructions (Signed)
We have sent your medicine to your pharmacy. Please see your follow up appointment listed below.

## 2016-01-07 ENCOUNTER — Ambulatory Visit (INDEPENDENT_AMBULATORY_CARE_PROVIDER_SITE_OTHER): Payer: Medicaid Other | Admitting: Surgery

## 2016-01-07 ENCOUNTER — Ambulatory Visit (INDEPENDENT_AMBULATORY_CARE_PROVIDER_SITE_OTHER): Payer: Medicaid Other | Admitting: Urology

## 2016-01-07 ENCOUNTER — Encounter: Payer: Self-pay | Admitting: Surgery

## 2016-01-07 VITALS — BP 153/101 | HR 76 | Ht 72.0 in | Wt 181.0 lb

## 2016-01-07 VITALS — BP 146/99 | HR 82 | Temp 98.6°F | Ht 72.0 in | Wt 182.0 lb

## 2016-01-07 DIAGNOSIS — L732 Hidradenitis suppurativa: Secondary | ICD-10-CM | POA: Diagnosis not present

## 2016-01-07 DIAGNOSIS — N492 Inflammatory disorders of scrotum: Secondary | ICD-10-CM | POA: Diagnosis not present

## 2016-01-07 DIAGNOSIS — L02215 Cutaneous abscess of perineum: Secondary | ICD-10-CM

## 2016-01-07 NOTE — Progress Notes (Signed)
01/07/2016 11:53 AM   Joshua Tate Dec 08, 1975 [id number]  Referring provider: [facility] [address] [place], [place] [id number]  Chief Complaint  Patient presents with  . Wound Check    HPI: 40 year old male with chronic hidradenitis suppurativa who returns to the office today after being sent from the general surgery office for concern for left scrotal abscess recurrence. He has a complex perirectal, perineal, and left scrotal abscess status post I&D by myself and Dr. Aleen CampiPiscoya on 11/10/2015.  He was seen last week by Dr. Excell Seltzerooper placed on Keflex for chronic inflammatory changes. He failed to fill this prescription until yesterday.    He has had scant drainage of his left scrotum over the past few days. No significant swelling. No fevers or chills.  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   PMH: Past Medical History:  Diagnosis Date  . Abscess    to rectum  . Hypertension     Surgical History: Past Surgical History:  Procedure Laterality Date  . CARDIAC CATHETERIZATION Left 11/17/2015   Procedure: Left Heart Cath and Coronary Angiography;  Surgeon: Iran OuchMuhammad A Arida, MD;  Location: [facility] INVASIVE [facility] LAB;  Service: Cardiovascular;  Laterality: Left;  . EYE SURGERY    . INCISION AND DRAINAGE ABSCESS N/A 09/26/2015   Procedure: INCISION AND DRAINAGE perirectal  ABSCESS;  Surgeon: Leafy Roiego F Pabon, MD;  Location: [facility] ORS;  Service: General;  Laterality: N/A;  . INCISION AND DRAINAGE ABSCESS N/A 11/10/2015   Procedure: INCISION AND DRAINAGE ABSCESS;  Surgeon: Joshua ScotlandAshley Champagne Paletta, MD;  Location: [facility] ORS;  Service: Urology;  Laterality: N/A;    Home Medications:    Medication List       Accurate as of 01/07/16 11:53 AM. Always use your most recent med list.          atorvastatin 40 MG  tablet Commonly known as:  LIPITOR Take 1 tablet (40 mg total) by mouth daily at 6 PM.   carvedilol 12.5 MG tablet Commonly known as:  COREG Take 1 tablet (12.5 mg total) by mouth 2 (two) times daily with a meal.   cephALEXin 500 MG capsule Commonly known as:  KEFLEX Take 1 capsule (500 mg total) by mouth 4 (four) times daily.   losartan 100 MG tablet Commonly known as:  COZAAR Take 1 tablet (100 mg total) by mouth daily.   warfarin 5 MG tablet Commonly known as:  COUMADIN Take 1 tablet (5 mg total) by mouth daily.       Allergies:  Allergies  Allergen Reactions  . Aspirin     Other reaction(s): Other (See Comments)  . Latex Rash    Family History: Family History  Problem Relation Age of Onset  . CAD Mother     a. MI age 41  . Hypertension Mother   . Diabetes Mellitus II Mother   . Kidney disease Mother     Dialysis  . Lung cancer Maternal Grandmother   . Prostate cancer Paternal Grandfather   . Hematuria Neg Hx     Social History:  reports that he has been smoking Cigars.  He has a 40.00 pack-year smoking history. He has never used smokeless tobacco. He reports that he drinks alcohol. He reports that he uses drugs, including Marijuana, about 7 times per week.  ROS:  Physical Exam: BP (!) 153/101   Pulse 76   Ht 6' (1.829 m)   Wt 181 lb (82.1 kg)   BMI 24.55 kg/m   Constitutional:  Alert and oriented, No acute distress. Strong tobacco odor. HEENT: [place] AT, moist mucus membranes.  Trachea midline, no masses. Cardiovascular: No clubbing, cyanosis, or edema. Respiratory: Normal respiratory effort, no increased work of breathing. GI: Abdomen is soft, nontender, nondistended, no abdominal masses GU: Circumcised phallus with orthotopic meatus. No urethral discharge. Nontender, non-erythematous scrotum with multiple bilateral inguinal scars which are indurated. Complex left scrotal scar with granulation  tissue at base. In the mid left hemiscrotum, there is a punctate opening through which a pinhead amount of bloody/ purulent material can be expressed but no significant underlying fluctuance.  Bilateral testicles normal, nontender, no masses. Skin: Scar like defects in the beard and bilateral inguinal regions. Neurologic: Grossly intact, no focal deficits, moving all 4 extremities. Psychiatric: Normal mood and affect.  Laboratory Data: Lab Results  Component Value Date   WBC 12.6 (H) 12/04/2015   HGB 13.6 12/04/2015   HCT 41.3 12/04/2015   MCV 88.6 12/04/2015   PLT 233 12/04/2015    Lab Results  Component Value Date   CREATININE 0.67 12/04/2015     Lab Results  Component Value Date   HGBA1C 5.7 (H) 11/11/2015    Assessment & Plan:    1. Scrotal abscess Subtle changes, chronic over left hemiscrotum with scant purulent bloody drainage No underlying fluctuance to suggest large recurrent abscess Agree with antibiotics as prescribed, Keflex and reassess in one week Patient advised to call / present to ED if his drainage is worse or he has any worsening skin changes  2. Perineal abscess Resolved  3. Hydradenitis Chronic, ideally referral to plastics for consideration of more aggressive debridement with flaps/skin graft, however, given medical comorbidities including ongoing smoking, poorly controlled diabetes, need for anticoagulation, he is not a good elective surgical candidate   Return in about 1 week (around 01/14/2016) for wound check.  Joshua ScotlandAshley Tiegan Jambor, MD  [facility] [address], [address] [place], [place] [address] [phone]

## 2016-01-07 NOTE — Progress Notes (Signed)
01/07/2016  HPI: Patient is status post combined perineal and left scrotal abscess incision and drainage by Dr. Apolinar JunesBrandon and myself on 11/10/15. He was seen on 11/29 by Dr. Excell Seltzerooper who noted probable hydradenitis suppurativa of the left groin with a complex wound on the left side of the scrotum and groin. He was started on Keflex and schedule for follow-up today with me. Patient reports that he started Keflex yesterday and has noted intermittent tenderness over the left scrotum with drainage onto his underwear. He is uncertain as to what kind of drainage dizziness but he does notice that his underwear is wet and occasionally stuck to the scrotum. He denies any fevers, chills, chest pain, shortness of breath, any other areas of drainage or tenderness.  Vital signs: BP (!) 146/99   Pulse 82   Temp 98.6 F (37 C) (Oral)   Ht 6' (1.829 m)   Wt 82.6 kg (182 lb)   BMI 24.68 kg/m    Physical Exam: Constitutional: No acute distress GU: Patient has complex scars over the left groin, left scrotum and perineal areas consistent with prior abscesses and hydradenitis. Over the left scrotum the patient has an area of induration with swelling and tenderness with minimal drainage. There are no other areas of drainage and no other areas of tenderness.  Assessment/Plan: 40 year old male status post incision and drainage of left scrotal and perineal abscess on 10/9.  -Given the left scrotal wound and possible abscess recurrence, we will refer the patient to be seen by Dr. Apolinar JunesBrandon today in her office. Otherwise the perineal region is clean and dry with no evidence of new infections. The patient will coordinate with his Medicaid ride so he can be picked up later that he can go to Dr. Delana MeyerBrandon's office. -From the general surgery standpoint, given that his perineal wounds are healed and there is no abscess he may return to our office on an as-needed basis.   Joshua IllJose Luis Ilanna Deihl, MD [facility]

## 2016-01-07 NOTE — Patient Instructions (Signed)
I have spoken with Dr. Delana MeyerBrandon's office at this time. Please go to their office on [address] at this time to see Dr. Apolinar JunesBrandon.

## 2016-01-13 ENCOUNTER — Encounter: Payer: Self-pay | Admitting: Urology

## 2016-01-13 ENCOUNTER — Ambulatory Visit (INDEPENDENT_AMBULATORY_CARE_PROVIDER_SITE_OTHER): Payer: Medicaid Other | Admitting: Urology

## 2016-01-13 VITALS — BP 157/106 | HR 63 | Ht 72.0 in | Wt 183.7 lb

## 2016-01-13 DIAGNOSIS — L02215 Cutaneous abscess of perineum: Secondary | ICD-10-CM

## 2016-01-13 DIAGNOSIS — N492 Inflammatory disorders of scrotum: Secondary | ICD-10-CM

## 2016-01-13 DIAGNOSIS — L732 Hidradenitis suppurativa: Secondary | ICD-10-CM

## 2016-01-13 NOTE — Progress Notes (Signed)
01/13/2016 4:03 PM   Joshua HousekeeperJohn G Tate 03/29/1975 [id number]  Referring provider: [facility] [address] [place], [place] [id number]  Chief Complaint  Patient presents with  . Follow-up    Wound check    HPI: 40 year old male with chronic hidradenitis suppurativa who returns to the office today for a one week follow up to recheck her scrotal abscess.  Patient was sent from the general surgery office two weeks ago for concern for left scrotal abscess recurrence.  He has a complex perirectal, perineal, and left scrotal abscess status post I&D by myself and Dr. Aleen CampiPiscoya on 11/10/2015.  He was seen two weeks ago by Dr. Excell Seltzerooper placed on Keflex for chronic inflammatory changes.  He states he is still taking the Keflex and has "half a bottle left."    He has had scant drainage of his left scrotum over the past few days. No significant swelling. No fevers or chills.  He has not had scrotal pain.    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   PMH: Past Medical History:  Diagnosis Date  . Abscess    to rectum  . Hypertension     Surgical History: Past Surgical History:  Procedure Laterality Date  . CARDIAC CATHETERIZATION Left 11/17/2015   Procedure: Left Heart Cath and Coronary Angiography;  Surgeon: Iran OuchMuhammad A Arida, MD;  Location: [facility] INVASIVE CV LAB;  Service: Cardiovascular;  Laterality: Left;  . EYE SURGERY    . INCISION AND DRAINAGE ABSCESS N/A 09/26/2015   Procedure: INCISION AND DRAINAGE perirectal  ABSCESS;  Surgeon: Leafy Roiego F Pabon, MD;  Location: [facility] ORS;  Service: General;  Laterality: N/A;  . INCISION AND DRAINAGE ABSCESS N/A 11/10/2015   Procedure: INCISION AND DRAINAGE ABSCESS;  Surgeon: Vanna ScotlandAshley Brandon, MD;  Location: [facility] ORS;  Service: Urology;  Laterality: N/A;    Home  Medications:    Medication List       Accurate as of 01/13/16  4:03 PM. Always use your most recent med list.          atorvastatin 40 MG tablet Commonly known as:  LIPITOR Take 1 tablet (40 mg total) by mouth daily at 6 PM.   carvedilol 12.5 MG tablet Commonly known as:  COREG Take 1 tablet (12.5 mg total) by mouth 2 (two) times daily with a meal.   cephALEXin 500 MG capsule Commonly known as:  KEFLEX Take 1 capsule (500 mg total) by mouth 4 (four) times daily.   losartan 100 MG tablet Commonly known as:  COZAAR Take 1 tablet (100 mg total) by mouth daily.   warfarin 5 MG tablet Commonly known as:  COUMADIN Take 1 tablet (5 mg total) by mouth daily.       Allergies:  Allergies  Allergen Reactions  . Aspirin     Other reaction(s): Other (See Comments)  . Latex Rash    Family History: Family History  Problem Relation Age of Onset  . CAD Mother     a. MI age 41  . Hypertension Mother   . Diabetes Mellitus II Mother   . Kidney disease Mother     Dialysis  . Lung cancer Maternal Grandmother   . Prostate cancer Paternal Grandfather   . Hematuria Neg Hx     Social History:  reports that he has been smoking Cigars.  He has  a 40.00 pack-year smoking history. He has never used smokeless tobacco. He reports that he drinks alcohol. He reports that he uses drugs, including Marijuana, about 7 times per week.  ROS: UROLOGY Frequent Urination?: Yes Hard to postpone urination?: No Burning/pain with urination?: No Get up at night to urinate?: Yes Leakage of urine?: No Urine stream starts and stops?: No Trouble starting stream?: No Do you have to strain to urinate?: No Blood in urine?: No Urinary tract infection?: No Sexually transmitted disease?: No Injury to kidneys or bladder?: No Painful intercourse?: No Weak stream?: No Erection problems?: No Penile pain?: No  Gastrointestinal Nausea?: No Vomiting?: No Indigestion/heartburn?: No Diarrhea?:  No Constipation?: No  Constitutional Fever: No Night sweats?: No Weight loss?: Yes Fatigue?: No  Skin Skin rash/lesions?: Yes Itching?: Yes  Eyes Blurred vision?: No Double vision?: No  Ears/Nose/Throat Sore throat?: No Sinus problems?: No  Hematologic/Lymphatic Swollen glands?: No Easy bruising?: No  Cardiovascular Leg swelling?: No Chest pain?: No  Respiratory Cough?: Yes Shortness of breath?: Yes  Endocrine Excessive thirst?: No  Musculoskeletal Back pain?: No Joint pain?: No  Neurological Headaches?: No Dizziness?: No  Psychologic Depression?: No Anxiety?: No  Physical Exam: BP (!) 157/106 (BP Location: Left Arm, Patient Position: Sitting, Cuff Size: Normal)   Pulse 63   Ht 6' (1.829 m)   Wt 183 lb 11.2 oz (83.3 kg)   BMI 24.91 kg/m   Constitutional:  Alert and oriented, No acute distress. Strong tobacco odor. HEENT: [place] AT, moist mucus membranes.  Trachea midline, no masses. Cardiovascular: No clubbing, cyanosis, or edema. Respiratory: Normal respiratory effort, no increased work of breathing. GI: Abdomen is soft, nontender, nondistended, no abdominal masses GU: Circumcised phallus with orthotopic meatus. No urethral discharge. Nontender, non-erythematous scrotum with multiple bilateral inguinal scars which are indurated. Complex left scrotal scar with granulation tissue at base. In the mid left hemiscrotum, there is a punctate opening through which a pinhead amount of bloody/ purulent material can be expressed but no significant underlying fluctuance.  Bilateral testicles normal, nontender, no masses. Skin: Scar like defects in the beard and bilateral inguinal regions. Neurologic: Grossly intact, no focal deficits, moving all 4 extremities. Psychiatric: Normal mood and affect.  Laboratory Data: Lab Results  Component Value Date   WBC 12.6 (H) 12/04/2015   HGB 13.6 12/04/2015   HCT 41.3 12/04/2015   MCV 88.6 12/04/2015   PLT 233 12/04/2015     Lab Results  Component Value Date   CREATININE 0.67 12/04/2015     Lab Results  Component Value Date   HGBA1C 5.7 (H) 11/11/2015    Assessment & Plan:    1. Scrotal abscess Subtle changes, chronic over left hemiscrotum with scant purulent bloody drainage No underlying fluctuance to suggest large recurrent abscess Agree with antibiotics as prescribed, Keflex and reassess in one week - encouraged patient to finish the Keflex Patient advised to call / present to ED if his drainage is worse or he has any worsening skin changes  2. Perineal abscess Resolved  3. Hydradenitis Chronic, ideally referral to plastics for consideration of more aggressive debridement with flaps/skin graft, however, given medical comorbidities including ongoing smoking, poorly controlled diabetes, need for anticoagulation, he is not a good elective surgical candidate Patient will RTC in 6 months as it may be safe to stop anticoagulation and we can refer to plastics  Encouraged the patient to stop smoking and to maintain good control of his diabetes   Return in about 6 months (around 07/13/2016)  for wound recheck.  Michiel Cowboy, PA-C  [facility] [address] [place], [place] [address] [phone]

## 2016-01-14 ENCOUNTER — Ambulatory Visit (INDEPENDENT_AMBULATORY_CARE_PROVIDER_SITE_OTHER): Payer: Medicaid Other

## 2016-01-14 ENCOUNTER — Ambulatory Visit: Payer: Medicaid Other | Admitting: Urology

## 2016-01-14 DIAGNOSIS — I513 Intracardiac thrombosis, not elsewhere classified: Secondary | ICD-10-CM | POA: Diagnosis not present

## 2016-01-14 DIAGNOSIS — Z5181 Encounter for therapeutic drug level monitoring: Secondary | ICD-10-CM

## 2016-01-14 LAB — POCT INR: INR: 1.2

## 2016-01-15 ENCOUNTER — Emergency Department: Payer: Medicaid Other

## 2016-01-15 ENCOUNTER — Emergency Department
Admission: EM | Admit: 2016-01-15 | Discharge: 2016-01-15 | Disposition: A | Discharge status: Home / Self Care | Payer: Medicaid Other | Attending: Emergency Medicine | Admitting: Emergency Medicine

## 2016-01-15 ENCOUNTER — Encounter: Payer: Self-pay | Admitting: *Deleted

## 2016-01-15 DIAGNOSIS — Z7901 Long term (current) use of anticoagulants: Secondary | ICD-10-CM | POA: Diagnosis not present

## 2016-01-15 DIAGNOSIS — R51 Headache: Secondary | ICD-10-CM | POA: Diagnosis present

## 2016-01-15 DIAGNOSIS — T588X1A Toxic effect of carbon monoxide from other source, accidental (unintentional), initial encounter: Secondary | ICD-10-CM | POA: Diagnosis not present

## 2016-01-15 DIAGNOSIS — Z79899 Other long term (current) drug therapy: Secondary | ICD-10-CM | POA: Diagnosis not present

## 2016-01-15 DIAGNOSIS — E119 Type 2 diabetes mellitus without complications: Secondary | ICD-10-CM | POA: Diagnosis not present

## 2016-01-15 DIAGNOSIS — I159 Secondary hypertension, unspecified: Secondary | ICD-10-CM | POA: Diagnosis not present

## 2016-01-15 DIAGNOSIS — F17290 Nicotine dependence, other tobacco product, uncomplicated: Secondary | ICD-10-CM | POA: Diagnosis not present

## 2016-01-15 DIAGNOSIS — T5891XA Toxic effect of carbon monoxide from unspecified source, accidental (unintentional), initial encounter: Secondary | ICD-10-CM

## 2016-01-15 LAB — COOXEMETRY PANEL
Carboxyhemoglobin: 8.9 % (ref 0.5–1.5)
Carboxyhemoglobin: 3.9 % — ABNORMAL HIGH (ref 0.5–1.5)
Methemoglobin: 1.6 % — ABNORMAL HIGH (ref 0.0–1.5)
Methemoglobin: 1.4 % (ref 0.0–1.5)

## 2016-01-15 LAB — CBC WITH DIFFERENTIAL/PLATELET
BASOS ABS: 0 10*3/uL (ref 0–0.1)
BASOS PCT: 0 %
Eosinophils Absolute: 0.2 10*3/uL (ref 0–0.7)
Eosinophils Relative: 2 %
HEMATOCRIT: 39.4 % — AB (ref 40.0–52.0)
HEMOGLOBIN: 13.1 g/dL (ref 13.0–18.0)
LYMPHS PCT: 36 %
Lymphs Abs: 3.4 10*3/uL (ref 1.0–3.6)
MCH: 29.2 pg (ref 26.0–34.0)
MCHC: 33.3 g/dL (ref 32.0–36.0)
MCV: 87.7 fL (ref 80.0–100.0)
MONO ABS: 0.8 10*3/uL (ref 0.2–1.0)
Monocytes Relative: 9 %
NEUTROS ABS: 4.9 10*3/uL (ref 1.4–6.5)
NEUTROS PCT: 53 %
Platelets: 220 10*3/uL (ref 150–440)
RBC: 4.49 MIL/uL (ref 4.40–5.90)
RDW: 17 % — AB (ref 11.5–14.5)
WBC: 9.3 10*3/uL (ref 3.8–10.6)

## 2016-01-15 LAB — PROTIME-INR
INR: 1.16
PROTHROMBIN TIME: 14.9 s (ref 11.4–15.2)

## 2016-01-15 LAB — BASIC METABOLIC PANEL
ANION GAP: 6 (ref 5–15)
BUN: 7 mg/dL (ref 6–20)
CALCIUM: 9 mg/dL (ref 8.9–10.3)
CO2: 26 mmol/L (ref 22–32)
Chloride: 108 mmol/L (ref 101–111)
Creatinine, Ser: 0.81 mg/dL (ref 0.61–1.24)
GFR calc non Af Amer: >60 mL/min (ref >60)
Glucose, Bld: 95 mg/dL (ref 65–99)
POTASSIUM: 3.7 mmol/L (ref 3.5–5.1)
Sodium: 140 mmol/L (ref 135–145)

## 2016-01-15 LAB — CARBOXYHEMOGLOBIN - COOX: Carboxyhemoglobin: 2.2 % — ABNORMAL HIGH (ref 0.5–1.5)

## 2016-01-15 LAB — TROPONIN I: Troponin I: 0.04 ng/mL (ref <0.03)

## 2016-01-15 MED ORDER — LOSARTAN POTASSIUM 50 MG PO TABS
100.0000 mg | ORAL_TABLET | Freq: Once | ORAL | Status: AC
  Administered 2016-01-15: 100 mg via ORAL
  Filled 2016-01-15: qty 2

## 2016-01-15 MED ORDER — CARVEDILOL 6.25 MG PO TABS
12.5000 mg | ORAL_TABLET | Freq: Once | ORAL | Status: AC
  Administered 2016-01-15: 12.5 mg via ORAL
  Filled 2016-01-15: qty 2

## 2016-01-15 NOTE — ED Notes (Signed)
Pt placed in subwiat with 100%nonrebreather mask on.

## 2016-01-15 NOTE — ED Notes (Signed)
Pt states that his bp is always high and that his cardiologist is aware

## 2016-01-15 NOTE — ED Provider Notes (Signed)
[facility] Emergency Department Provider Note    First MD Initiated Contact with Patient 01/15/16 [phone]     (approximate)  I have reviewed the triage vital signs and the nursing notes.   HISTORY  Chief Complaint Headache and Hypertension   HPI Joshua Tate is a 40 y.o. male presents via EMS from home with headache nausea and elevated blood pressure. Patient states that he has a kerosene heater in his home that he's been using all week. Per EMS on their arrival the odor of kerosene was markedly. Patient smells of kerosene on arrival to the emergency department. Patient denies any neurological symptoms.   Past Medical History:  Diagnosis Date  . Abscess    to rectum  . Hypertension     Patient Active Problem List   Diagnosis Date Noted  . Encounter for therapeutic drug monitoring 12/17/2015  . Renal infarct (HCC) 12/03/2015  . Nausea & vomiting 12/03/2015  . Diarrhea 12/03/2015  . Unstable angina (HCC)   . Scrotal abscess 11/10/2015  . Abnormal EKG 11/10/2015  . Hypertension 11/10/2015  . Diabetes mellitus (HCC) 11/10/2015  . Cardiomyopathy (HCC) 11/10/2015  . 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 Perirectal abscess 09/26/2015  . 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 Esophageal reflux 12/04/2008    Past Surgical History:  Procedure Laterality Date  . CARDIAC CATHETERIZATION Left 11/17/2015   Procedure: Left Heart Cath and Coronary Angiography;  Surgeon: Iran OuchMuhammad A Arida, MD;  Location: [facility] INVASIVE CV LAB;  Service: Cardiovascular;  Laterality: Left;  . EYE SURGERY    . INCISION AND DRAINAGE ABSCESS N/A 09/26/2015   Procedure: INCISION AND DRAINAGE perirectal  ABSCESS;  Surgeon: Leafy Roiego F Pabon, MD;  Location:  [facility] ORS;  Service: General;  Laterality: N/A;  . INCISION AND DRAINAGE ABSCESS N/A 11/10/2015   Procedure: INCISION AND DRAINAGE ABSCESS;  Surgeon: Vanna ScotlandAshley Brandon, MD;  Location: [facility] ORS;  Service: Urology;  Laterality: N/A;    Prior to Admission medications   Medication Sig Start Date End Date Taking? Authorizing Provider  atorvastatin (LIPITOR) 40 MG tablet Take 1 tablet (40 mg total) by mouth daily at 6 PM. 11/14/15   Iran OuchMuhammad A Arida, MD  carvedilol (COREG) 12.5 MG tablet Take 1 tablet (12.5 mg total) by mouth 2 (two) times daily with a meal. 12/17/15 12/16/16  Antonieta Ibaimothy J Gollan, MD  cephALEXin (KEFLEX) 500 MG capsule Take 1 capsule (500 mg total) by mouth 4 (four) times daily. 12/31/15   Lattie Hawichard E Cooper, MD  losartan (COZAAR) 100 MG tablet Take 1 tablet (100 mg total) by mouth daily. 12/17/15 12/16/16  Antonieta Ibaimothy J Gollan, MD  warfarin (COUMADIN) 5 MG tablet Take 1 tablet (5 mg total) by mouth daily. 12/17/15   Antonieta Ibaimothy J Gollan, MD    Allergies Aspirin and Latex  Family History  Problem Relation Age of Onset  . CAD Mother     a. MI age 41  . Hypertension Mother   . Diabetes Mellitus II Mother   . Kidney disease Mother     Dialysis  . Lung cancer Maternal Grandmother   . Prostate cancer Paternal Grandfather   . Hematuria Neg Hx     Social History Social History  Substance Use Topics  . Smoking status: Current Every Day Smoker  Packs/day: 2.00    Years: 20.00    Types: Cigars  . Smokeless tobacco: Never Used  . Alcohol use Yes     Comment: socially    Review of Systems Constitutional: No fever/chills Eyes: No visual changes. ENT: No sore throat. Cardiovascular: Denies chest pain. Respiratory: Denies shortness of breath. Gastrointestinal: No abdominal pain.  Positive for nausea, no vomiting.  No diarrhea.  No constipation. Genitourinary: Negative for dysuria. Musculoskeletal: Negative for back pain. Skin: Negative for rash. Neurological: Positive for  headache 10-point ROS otherwise negative.  ____________________________________________   PHYSICAL EXAM:  VITAL SIGNS: ED Triage Vitals  Enc Vitals Group     BP 01/15/16 0201 (!) 161/110     Pulse Rate 01/15/16 0201 81     Resp 01/15/16 0201 20     Temp 01/15/16 0201 98.6 F (37 C)     Temp Source 01/15/16 0201 Oral     SpO2 01/15/16 0201 100 %     Weight 01/15/16 0202 185 lb (83.9 kg)     Height 01/15/16 0202 6' (1.829 m)     Head Circumference --      Peak Flow --      Pain Score 01/15/16 0203 8     Pain Loc --      Pain Edu? --      Excl. in GC? --     Constitutional: Alert and oriented. Well appearing and in no acute distress. Eyes: Conjunctivae are normal. PERRL. EOMI. Head: Atraumatic. Ears:  Healthy appearing ear canals and TMs bilaterally Nose: No congestion/rhinnorhea. Mouth/Throat: Mucous membranes are moist.  Oropharynx non-erythematous. Neck: No stridor.  No meningeal signs.  Cardiovascular: Normal rate, regular rhythm. Good peripheral circulation. Grossly normal heart sounds. Respiratory: Normal respiratory effort.  No retractions. Lungs CTAB. Gastrointestinal: Soft and nontender. No distention.  Musculoskeletal: No lower extremity tenderness nor edema. No gross deformities of extremities. Neurologic:  Normal speech and language. No gross focal neurologic deficits are appreciated.  Skin:  Skin is warm, dry and intact. No rash noted. Psychiatric: Mood and affect are normal. Speech and behavior are normal.  ____________________________________________   LABS (all labs ordered are listed, but only abnormal results are displayed)  Labs Reviewed  COOXEMETRY PANEL - Abnormal; Notable for the following:       Result Value   Carboxyhemoglobin 8.9 (*)    Methemoglobin 1.6 (*)    All other components within normal limits  COOXEMETRY PANEL - Abnormal; Notable for the following:    Carboxyhemoglobin 3.9 (*)    All other components within normal limits       Procedures     INITIAL IMPRESSION / ASSESSMENT AND PLAN / ED COURSE  Pertinent labs & imaging results that were available during my care of the patient were reviewed by me and considered in my medical decision making (see chart for details).  Patient was placed on a nonrebreather high flow oxygen immediately upon arrival to the emergency department. Repeat carbon monoxide level after being on a nonrebreather for 3 hours is 3.9 initial carbon monoxide level was 8.9. We'll obtain repeat carboxyhemoglobin level. Spoke with the patient who stated that his headache had completely resolved. Patient's care transferred to Dr. Alphonzo Lemmings   Clinical Course     ____________________________________________  FINAL CLINICAL IMPRESSION(S) / ED DIAGNOSES  Final diagnoses:  Toxic effect of carbon monoxide, unintentional, initial encounter     MEDICATIONS GIVEN DURING THIS VISIT:  Medications - No data to display   NEW OUTPATIENT MEDICATIONS  STARTED DURING THIS VISIT:  New Prescriptions   No medications on file    Modified Medications   No medications on file    Discontinued Medications   No medications on file     Note:  This document was prepared using Dragon voice recognition software and may include unintentional dictation errors.    Darci Currentandolph N Annelle Behrendt, MD 01/16/16 [phone]

## 2016-01-15 NOTE — ED Notes (Signed)
RTT called to draw carboxyhemoglobin

## 2016-01-15 NOTE — ED Notes (Signed)
Gave pt breakfast tray

## 2016-01-15 NOTE — ED Notes (Signed)
Pt discharged home after verbalizing understanding of discharge instructions; nad noted. 

## 2016-01-15 NOTE — ED Notes (Signed)
Per Dr. Manson PasseyBrown, asked RN to order breakfast tray for patient.

## 2016-01-15 NOTE — ED Provider Notes (Signed)
-----------------------------------------   10:44 AM on 01/15/2016 -----------------------------------------  Patient with a history of hypertension uncontrolled noncompliance, baseline blood pressure has a diastolic over 100 on multiple prior reads in the past, usually dies systolic is in the 170s. He is at his baseline blood pressure at this time. Patient was seen by prior provider. The patient had a headache which is not unusual for him. He was neurologically intact. It was determined that his headache was likely secondary to using a kerosene heater in the house. Given his elevated blood pressure I did do a workup on him including CT had etc. all is reassuring. Patient is requesting discharge at this time. He is asymptomatic. He has no further complaints. His EKG shows significant LVH which is not surprising given his uncontrolled blood pressure. I have counseled him extensively about compliance with his medications and given him his medications here. He states he has a prescription at home.    EKG shows sinus rhythm rate 79 bpm, significant LVH noted, strain pattern noted. No change from prior  I have advised him not to use a kerosene heater, patient is in no acute distress eating and drinking and we will discharge him with return precautions. Headache is gone he states and he has no neurologic issues at this moment.   Jeanmarie PlantJames A Ronnika Collett, MD 01/15/16 1045

## 2016-01-15 NOTE — Discharge Instructions (Addendum)
Taken her blood pressure medications as prescribed follow closely with your primary care doctor or return to the emergency room for new or worrisome symptoms, do not use a kerosene heater or anything else in her house that burned gas or anything like gas. Get a carbon monoxide detector. If you feel worse come back.

## 2016-01-15 NOTE — ED Triage Notes (Signed)
Pt brought in via ems from home with a headache, nausea and high blood pressure.  Pt reports kerosene heater used in a small room.  Pt smells of kerosene.

## 2016-01-15 NOTE — ED Notes (Signed)
Res[place] from [place]. Pt resting comfortably with nrb mask on and VS WNL with exception of BP, which is 160/8111. NAD noted.

## 2016-01-21 ENCOUNTER — Ambulatory Visit (INDEPENDENT_AMBULATORY_CARE_PROVIDER_SITE_OTHER): Payer: Medicaid Other

## 2016-01-21 DIAGNOSIS — Z5181 Encounter for therapeutic drug level monitoring: Secondary | ICD-10-CM | POA: Diagnosis not present

## 2016-01-21 DIAGNOSIS — I513 Intracardiac thrombosis, not elsewhere classified: Secondary | ICD-10-CM | POA: Diagnosis not present

## 2016-01-21 LAB — POCT INR: INR: 1.2

## 2016-01-29 ENCOUNTER — Encounter: Payer: Self-pay | Admitting: Urology

## 2016-01-29 ENCOUNTER — Ambulatory Visit (INDEPENDENT_AMBULATORY_CARE_PROVIDER_SITE_OTHER): Payer: Medicaid Other | Admitting: Urology

## 2016-01-29 VITALS — BP 148/99 | HR 93 | Ht 72.0 in | Wt 195.0 lb

## 2016-01-29 DIAGNOSIS — N492 Inflammatory disorders of scrotum: Secondary | ICD-10-CM

## 2016-01-29 MED ORDER — OXYCODONE-ACETAMINOPHEN 5-325 MG PO TABS: 1.0000 | ORAL_TABLET | ORAL | 0 refills | Status: DC | PRN

## 2016-01-29 MED ORDER — CLINDAMYCIN HCL 300 MG PO CAPS: 600.0000 mg | ORAL_CAPSULE | Freq: Three times a day (TID) | ORAL | 0 refills | Status: DC

## 2016-01-29 NOTE — Addendum Note (Signed)
Addended by: Martha ClanWATTS, Avon Mergenthaler M on: 01/29/2016 12:37 PM   Modules accepted: Orders

## 2016-01-29 NOTE — Progress Notes (Signed)
01/29/2016 12:08 PM   Joshua Tate 1975-02-12 [id number]  Referring provider: [facility] [address] [place], [place] [id number]  Chief Complaint  Patient presents with  . Wound Check    HPI: 40 year old male with chronic hidradenitis suppurativa who returns to the office today after being sent from the general surgery office for concern for left scrotal abscess recurrence. He has a complex perirectal, perineal, and left scrotal abscess status post I&D on 11/10/2015.    He returns today with drainage from his scrotum concerning for abscess recurrence. He has noted drainage from his left hemiscrotum for a couple days now. He is very tender to palpation. No fevers or chills. It feels like previous abscesses.  He is currently on keflex which he has been on for over a month.  PMH: Past Medical History:  Diagnosis Date  . Abscess    to rectum  . Hypertension     Surgical History: Past Surgical History:  Procedure Laterality Date  . CARDIAC CATHETERIZATION Left 11/17/2015   Procedure: Left Heart Cath and Coronary Angiography;  Surgeon: Iran OuchMuhammad A Arida, MD;  Location: [facility] INVASIVE CV LAB;  Service: Cardiovascular;  Laterality: Left;  . EYE SURGERY    . INCISION AND DRAINAGE ABSCESS N/A 09/26/2015   Procedure: INCISION AND DRAINAGE perirectal  ABSCESS;  Surgeon: Leafy Roiego F Pabon, MD;  Location: [facility] ORS;  Service: General;  Laterality: N/A;  . INCISION AND DRAINAGE ABSCESS N/A 11/10/2015   Procedure: INCISION AND DRAINAGE ABSCESS;  Surgeon: Vanna ScotlandAshley Brandon, MD;  Location: [facility] ORS;  Service: Urology;  Laterality: N/A;    Home Medications:  Allergies as of 01/29/2016      Reactions   Aspirin    Other reaction(s): Other (See Comments) pt states not allergic to asa.   Latex Rash      Medication List       Accurate as of 01/29/16 12:08 PM. Always use your most recent med list.          atorvastatin 40 MG tablet Commonly known as:  LIPITOR Take 1 tablet  (40 mg total) by mouth daily at 6 PM.   carvedilol 12.5 MG tablet Commonly known as:  COREG Take 1 tablet (12.5 mg total) by mouth 2 (two) times daily with a meal.   clindamycin 300 MG capsule Commonly known as:  CLEOCIN Take 2 capsules (600 mg total) by mouth 3 (three) times daily.   losartan 100 MG tablet Commonly known as:  COZAAR Take 1 tablet (100 mg total) by mouth daily.   oxyCODONE-acetaminophen 5-325 MG tablet Commonly known as:  ROXICET Take 1-2 tablets by mouth every 4 (four) hours as needed for severe pain.   warfarin 5 MG tablet Commonly known as:  COUMADIN Take 10 mg by mouth daily.       Allergies:  Allergies  Allergen Reactions  . Aspirin     Other reaction(s): Other (See Comments) pt states not allergic to asa.  . Latex Rash    Family History: Family History  Problem Relation Age of Onset  . CAD Mother     a. MI age 41  . Hypertension Mother   . Diabetes Mellitus II Mother   . Kidney disease Mother     Dialysis  . Lung cancer Maternal Grandmother   . Prostate cancer Paternal Grandfather   . Hematuria Neg Hx     Social History:  reports that he has been smoking Cigars.  He has a 40.00 pack-year smoking history.  He has never used smokeless tobacco. He reports that he drinks alcohol. He reports that he uses drugs, including Marijuana, about 7 times per week.  ROS:                                        Physical Exam: BP (!) 148/99   Pulse 93   Ht 6' (1.829 m)   Wt 195 lb (88.5 kg)   BMI 26.45 kg/m   Constitutional:  Alert and oriented, No acute distress. HEENT: [place] AT, moist mucus membranes.  Trachea midline, no masses. Cardiovascular: No clubbing, cyanosis, or edema. Respiratory: Normal respiratory effort, no increased work of breathing. GI: Abdomen is soft, nontender, nondistended, no abdominal masses GU: No CVA tenderness. The patient has an abscess in his left hemiscrotum approximately 3 cemented in size. It is  currently draining from a 1 cm incision. There is fluctuance. No crepitus. No sign of Fournier's. Skin: No rashes, bruises or suspicious lesions. Lymph: No cervical or inguinal adenopathy. Neurologic: Grossly intact, no focal deficits, moving all 4 extremities. Psychiatric: Normal mood and affect.  Laboratory Data: Lab Results  Component Value Date   WBC 9.3 01/15/2016   HGB 13.1 01/15/2016   HCT 39.4 (L) 01/15/2016   MCV 87.7 01/15/2016   PLT 220 01/15/2016    Lab Results  Component Value Date   CREATININE 0.81 01/15/2016    No results found for: PSA  No results found for: TESTOSTERONE  Lab Results  Component Value Date   HGBA1C 5.7 (H) 11/11/2015    Urinalysis    Component Value Date/Time   COLORURINE STRAW (A) 12/03/2015 1000   APPEARANCEUR CLEAR (A) 12/03/2015 1000   LABSPEC 1.025 12/03/2015 1000   PHURINE 8.0 12/03/2015 1000   GLUCOSEU 50 (A) 12/03/2015 1000   HGBUR 2+ (A) 12/03/2015 1000   BILIRUBINUR NEGATIVE 12/03/2015 1000   KETONESUR 1+ (A) 12/03/2015 1000   PROTEINUR 100 (A) 12/03/2015 1000   NITRITE NEGATIVE 12/03/2015 1000   LEUKOCYTESUR NEGATIVE 12/03/2015 1000    Procedure:  The patient was prepped and draped in usual sterile fashion. In his left hemiscrotum where he was draining purulent discharge 10 cc of half percent lidocaine without epinephrine was used to anesthetize the skin. The incision was then extended from where his drainage was to 3 cm. His wound was then probed with a hemostat were all loculations were broken up and purulent discharge removed. A culture was sent. His abscess cavity was then packed with iodoform gauze.   Assessment & Plan:    1. Scrotal abscess s/p I & D -clindamycin for 10 days -wound check in one week -f/u wound cultures -remove two inches of packing daily  There are no diagnoses linked to this encounter.  Return in about 1 week (around 02/05/2016) for Michiel CowboyShannon McGowan, PA for wound check.  Hildred LaserBrian James Seibert Keeter,  MD  [facility] [address], [address] [place], [place] [address] [phone]

## 2016-02-03 LAB — ANAEROBIC CULTURE

## 2016-02-05 ENCOUNTER — Ambulatory Visit: Payer: Medicaid Other | Admitting: Urology

## 2016-02-09 ENCOUNTER — Ambulatory Visit (INDEPENDENT_AMBULATORY_CARE_PROVIDER_SITE_OTHER): Payer: Medicaid Other | Admitting: Urology

## 2016-02-09 ENCOUNTER — Encounter: Payer: Self-pay | Admitting: Urology

## 2016-02-09 VITALS — BP 151/91 | HR 86 | Ht 72.0 in | Wt 179.9 lb

## 2016-02-09 DIAGNOSIS — L732 Hidradenitis suppurativa: Secondary | ICD-10-CM

## 2016-02-09 DIAGNOSIS — N492 Inflammatory disorders of scrotum: Secondary | ICD-10-CM | POA: Diagnosis not present

## 2016-02-09 DIAGNOSIS — L02215 Cutaneous abscess of perineum: Secondary | ICD-10-CM | POA: Diagnosis not present

## 2016-02-09 MED ORDER — CEFUROXIME AXETIL 500 MG PO TABS: 500.0000 mg | ORAL_TABLET | Freq: Two times a day (BID) | ORAL | 0 refills | Status: DC

## 2016-02-09 MED ORDER — CLINDAMYCIN PHOSPHATE 1 % EX GEL: Freq: Two times a day (BID) | CUTANEOUS | 0 refills | Status: DC

## 2016-02-09 NOTE — Progress Notes (Signed)
02/09/2016 4:39 PM   Joshua Tate [birth year] [id number]  Referring provider: [facility] [address] [place], [place] [address]  Chief Complaint  Patient presents with  . Wound Check    1 week scrotal check    HPI: Patient was a 41 year old [place] American male who presents today for a wound recheck.  Background history 41 year old male with chronic hidradenitis suppurativa who returns to the office today after being sent from the general surgery office for concern for left scrotal abscess recurrence. He has a complex perirectal, perineal, and left scrotal abscess status post I&D on 11/10/2015.   He returns today with drainage from his scrotum concerning for abscess recurrence. He has noted drainage from his left hemiscrotum for a couple days now. He is very tender to palpation. No fevers or chills. It feels like previous abscesses. He is currently on keflex which he has been on for over a month.  Patient states that his pain decreased once the abscess was drained on 01/29/2016.  He has removed all the packing.  He has not had fevers, chills, nausea or vomiting.  He does not report any further drainage from scrotum.  Wound culture grew out Prevotella intermedia.  This organism is usually resistant to PCN.    Today, he is complaining of frequency and nocturia.    He has not picked up his warfarin prescription.  PMH: Past Medical History:  Diagnosis Date  . Abscess    to rectum  . Hypertension     Surgical History: Past Surgical History:  Procedure Laterality Date  . CARDIAC CATHETERIZATION Left 11/17/2015   Procedure: Left Heart Cath and Coronary Angiography;  Surgeon: Iran Ouch, MD;  Location: [facility] INVASIVE CV LAB;  Service: Cardiovascular;  Laterality: Left;  . EYE SURGERY    . INCISION AND DRAINAGE ABSCESS N/A 09/26/2015   Procedure: INCISION AND DRAINAGE perirectal  ABSCESS;  Surgeon: Leafy Ro, MD;  Location: [facility] ORS;  Service: General;   Laterality: N/A;  . INCISION AND DRAINAGE ABSCESS N/A 11/10/2015   Procedure: INCISION AND DRAINAGE ABSCESS;  Surgeon: Vanna Scotland, MD;  Location: [facility] ORS;  Service: Urology;  Laterality: N/A;    Home Medications:  Allergies as of 02/09/2016      Reactions   Aspirin    Other reaction(s): Other (See Comments) pt states not allergic to asa.   Latex Rash      Medication List       Accurate as of 02/09/16  4:39 PM. Always use your most recent med list.          atorvastatin 40 MG tablet Commonly known as:  LIPITOR Take 1 tablet (40 mg total) by mouth daily at 6 PM.   carvedilol 12.5 MG tablet Commonly known as:  COREG Take 1 tablet (12.5 mg total) by mouth 2 (two) times daily with a meal.   cefUROXime 500 MG tablet Commonly known as:  CEFTIN Take 1 tablet (500 mg total) by mouth 2 (two) times daily with a meal.   clindamycin 1 % gel Commonly known as:  CLINDAGEL Apply topically 2 (two) times daily.   clindamycin 300 MG capsule Commonly known as:  CLEOCIN Take 2 capsules (600 mg total) by mouth 3 (three) times daily.   losartan 100 MG tablet Commonly known as:  COZAAR Take 1 tablet (100 mg total) by mouth daily.   oxyCODONE-acetaminophen 5-325 MG tablet Commonly known as:  ROXICET Take 1-2 tablets by mouth every 4 (  four) hours as needed for severe pain.   warfarin 5 MG tablet Commonly known as:  COUMADIN Take 10 mg by mouth daily.       Allergies:  Allergies  Allergen Reactions  . Aspirin     Other reaction(s): Other (See Comments) pt states not allergic to asa.  . Latex Rash    Family History: Family History  Problem Relation Age of Onset  . CAD Mother     a. MI age 42  . Hypertension Mother   . Diabetes Mellitus II Mother   . Kidney disease Mother     Dialysis  . Lung cancer Maternal Grandmother   . Prostate cancer Paternal Grandfather   . Hematuria Neg Hx   . Bladder Cancer Neg Hx     Social History:  reports that he has been smoking Cigars.   He has a 40.00 pack-year smoking history. He has never used smokeless tobacco. He reports that he drinks alcohol. He reports that he uses drugs, including Marijuana, about 7 times per week.  ROS: UROLOGY Frequent Urination?: Yes Hard to postpone urination?: No Burning/pain with urination?: No Get up at night to urinate?: Yes Leakage of urine?: No Urine stream starts and stops?: No Trouble starting stream?: No Do you have to strain to urinate?: No Blood in urine?: No Urinary tract infection?: No Sexually transmitted disease?: No Injury to kidneys or bladder?: No Painful intercourse?: No Weak stream?: No Erection problems?: No Penile pain?: No  Gastrointestinal Nausea?: No Vomiting?: No Indigestion/heartburn?: Yes Diarrhea?: No Constipation?: No  Constitutional Fever: No Night sweats?: No Weight loss?: Yes Fatigue?: No  Skin Skin rash/lesions?: No Itching?: Yes  Eyes Blurred vision?: No Double vision?: No  Ears/Nose/Throat Sore throat?: No Sinus problems?: No  Hematologic/Lymphatic Swollen glands?: No Easy bruising?: No  Cardiovascular Leg swelling?: No Chest pain?: No  Respiratory Cough?: No Shortness of breath?: No  Endocrine Excessive thirst?: No  Musculoskeletal Back pain?: No Joint pain?: No  Neurological Headaches?: No Dizziness?: No  Psychologic Depression?: No Anxiety?: No  Physical Exam: BP (!) 151/91   Pulse 86   Ht 6' (1.829 m)   Wt 179 lb 14.4 oz (81.6 kg)   BMI 24.40 kg/m   Constitutional:  Alert and oriented, No acute distress. HEENT: [place] AT, moist mucus membranes.  Trachea midline, no masses. Cardiovascular: No clubbing, cyanosis, or edema. Respiratory: Normal respiratory effort, no increased work of breathing. GI: Abdomen is soft, nontender, nondistended, no abdominal masses GU: No CVA tenderness. The patient has areas of firmness and several healing areas with granuloma tissue from previous drainage sites.  No pus is  expelled with vigorous palpation.  An area ( 3 mm x 3 mm) of fluctuance is in the left scrotal.  A stab wound is applied and a small amount of pus is expelled.  No crepitus. No sign of Fournier's. Rectal:  An area of firmness (5 mm x 10 mm) is located in the left perirectal area.  No fluctuance, crepitus or sign of Fournier's is noted.   Skin: No rashes, bruises or suspicious lesions. Lymph: No cervical or inguinal adenopathy. Neurologic: Grossly intact, no focal deficits, moving all 4 extremities. Psychiatric: Normal mood and affect.  Laboratory Data: Lab Results  Component Value Date   WBC 9.3 01/15/2016   HGB 13.1 01/15/2016   HCT 39.4 (L) 01/15/2016   MCV 87.7 01/15/2016   PLT 220 01/15/2016    Lab Results  Component Value Date   CREATININE 0.81 01/15/2016  Lab Results  Component Value Date   HGBA1C 5.7 (H) 11/11/2015     Procedure:  The patient was prepped and draped in usual sterile fashion. In his left hemiscrotum a small stab wound was applied and a scant amount of pus was obtained.     Assessment & Plan:    1. . Scrotal abscess s/p I & D  - discontinue clindamycin and start Ceftin for 10 days  -wound check in one week  2. Suppurative hydradenitis  - prescribed clindamycin gel as patient states that this has worked for him in the past  - placed referral to plastics at this time   - patient is on anticoagulants, but would like their input  3. History of perirectal abscess  - will recheck area in the rectum in one week  - advised patient to seek treatment in the ED if it becomes painful or he starts to have fevers or chills  Return in about 1 week (around 02/16/2016) for wound recheck.  Michiel Cowboy, PA-C  [facility] [address], [address] [place], [place] [address] [phone]

## 2016-02-10 ENCOUNTER — Telehealth: Payer: Self-pay

## 2016-02-10 NOTE — Telephone Encounter (Signed)
Error

## 2016-02-16 ENCOUNTER — Ambulatory Visit: Payer: Self-pay | Admitting: Cardiovascular Disease

## 2016-02-17 ENCOUNTER — Ambulatory Visit (INDEPENDENT_AMBULATORY_CARE_PROVIDER_SITE_OTHER): Payer: Medicaid Other | Admitting: Urology

## 2016-02-17 ENCOUNTER — Encounter: Payer: Self-pay | Admitting: Urology

## 2016-02-17 VITALS — BP 151/98 | HR 77 | Ht 72.0 in | Wt 185.1 lb

## 2016-02-17 DIAGNOSIS — L732 Hidradenitis suppurativa: Secondary | ICD-10-CM

## 2016-02-17 DIAGNOSIS — L02215 Cutaneous abscess of perineum: Secondary | ICD-10-CM

## 2016-02-17 DIAGNOSIS — N492 Inflammatory disorders of scrotum: Secondary | ICD-10-CM | POA: Diagnosis not present

## 2016-02-17 NOTE — Progress Notes (Signed)
02/17/2016 3:43 PM   Joshua HousekeeperJohn G Tate 27-Jul-1975 [id number]  Referring provider: [facility] [address] [place], [place] [id number]  Chief Complaint  Patient presents with  . Follow-up    wound check 1 week scrotal abscess    HPI: Patient was a 41 year old [place] American male who presents today for a wound recheck.  Background history 41 year old male with chronic hidradenitis suppurativa who returns to the office today after being sent from the general surgery office for concern for left scrotal abscess recurrence. He has a complex perirectal, perineal, and left scrotal abscess status post I&D on 11/10/2015.   He returns today with drainage from his scrotum concerning for abscess recurrence. He has noted drainage from his left hemiscrotum for a couple days now. He is very tender to palpation. No fevers or chills. It feels like previous abscesses. He is currently on keflex which he has been on for over a month.  Patient states that his pain decreased once the abscess was drained on 01/29/2016.  He has removed all the packing.  Wound culture grew out Prevotella intermedia.  This organism is usually resistant to PCN.    Today, he is complaining of frequency and nocturia.  He has not had any pain in the perineal or scrotal area.  He has had scant amount of drainage.  He has not had fevers, chills, nausea or vomiting.    He has not picked up his Ceftin prescription.    PMH: Past Medical History:  Diagnosis Date  . Abscess    to rectum  . Hypertension     Surgical History: Past Surgical History:  Procedure Laterality Date  . CARDIAC CATHETERIZATION Left 11/17/2015   Procedure: Left Heart Cath and Coronary Angiography;  Surgeon: Iran OuchMuhammad A Arida, MD;  Location: [facility] INV[facility];  Service: Cardiovascular;  Laterality: Left;  . EYE SURGERY    . INCISION AND DRAINAGE ABSCESS N/A 09/26/2015   Procedure: INCISION AND DRAINAGE perirectal  ABSCESS;  Surgeon: Leafy Roiego F  Pabon, MD;  Location: [facility] ORS;  Service: General;  Laterality: N/A;  . INCISION AND DRAINAGE ABSCESS N/A 11/10/2015   Procedure: INCISION AND DRAINAGE ABSCESS;  Surgeon: Vanna ScotlandAshley Brandon, MD;  Location: [facility] ORS;  Service: Urology;  Laterality: N/A;    Home Medications:  Allergies as of 02/17/2016      Reactions   Aspirin    Other reaction(s): Other (See Comments) pt states not allergic to asa.   Latex Rash      Medication List       Accurate as of 02/17/16  3:43 PM. Always use your most recent med list.          atorvastatin 40 MG tablet Commonly known as:  LIPITOR Take 1 tablet (40 mg total) by mouth daily at 6 PM.   carvedilol 12.5 MG tablet Commonly known as:  COREG Take 1 tablet (12.5 mg total) by mouth 2 (two) times daily with a meal.   cefUROXime 500 MG tablet Commonly known as:  CEFTIN Take 1 tablet (500 mg total) by mouth 2 (two) times daily with a meal.   clindamycin 1 % gel Commonly known as:  CLINDAGEL Apply topically 2 (two) times daily.   clindamycin 300 MG capsule Commonly known as:  CLEOCIN Take 2 capsules (600 mg total) by mouth 3 (three) times daily.   losartan 100 MG tablet Commonly known as:  COZAAR Take 1 tablet (100 mg total) by mouth daily.   oxyCODONE-acetaminophen 5-325  MG tablet Commonly known as:  ROXICET Take 1-2 tablets by mouth every 4 (four) hours as needed for severe pain.   warfarin 5 MG tablet Commonly known as:  COUMADIN Take 10 mg by mouth daily.       Allergies:  Allergies  Allergen Reactions  . Aspirin     Other reaction(s): Other (See Comments) pt states not allergic to asa.  . Latex Rash    Family History: Family History  Problem Relation Age of Onset  . CAD Mother     a. MI age 33  . Hypertension Mother   . Diabetes Mellitus II Mother   . Kidney disease Mother     Dialysis  . Lung cancer Maternal Grandmother   . Prostate cancer Paternal Grandfather   . Hematuria Neg Hx   . Bladder Cancer Neg Hx      Social History:  reports that he has been smoking Cigars.  He has a 40.00 pack-year smoking history. He has never used smokeless tobacco. He reports that he drinks alcohol. He reports that he uses drugs, including Marijuana, about 7 times per week.  ROS: UROLOGY Frequent Urination?: Yes Hard to postpone urination?: No Burning/pain with urination?: No Get up at night to urinate?: Yes Leakage of urine?: No Urine stream starts and stops?: No Trouble starting stream?: No Do you have to strain to urinate?: No Blood in urine?: No Urinary tract infection?: No Sexually transmitted disease?: No Injury to kidneys or bladder?: No Painful intercourse?: No Weak stream?: No Erection problems?: No Penile pain?: No  Gastrointestinal Nausea?: No Vomiting?: No Indigestion/heartburn?: No Diarrhea?: No Constipation?: No  Constitutional Fever: No Night sweats?: No Weight loss?: No Fatigue?: No  Skin Skin rash/lesions?: No Itching?: No  Eyes Blurred vision?: Yes Double vision?: No  Ears/Nose/Throat Sore throat?: No Sinus problems?: Yes  Hematologic/Lymphatic Swollen glands?: No Easy bruising?: No  Cardiovascular Leg swelling?: No Chest pain?: No  Respiratory Cough?: No Shortness of breath?: No  Endocrine Excessive thirst?: Yes  Musculoskeletal Back pain?: No Joint pain?: No  Neurological Headaches?: No Dizziness?: No  Psychologic Depression?: No Anxiety?: No  Physical Exam: BP (!) 151/98   Pulse 77   Ht 6' (1.829 m)   Wt 185 lb 1.6 oz (84 kg)   BMI 25.10 kg/m   Constitutional:  Alert and oriented, No acute distress. HEENT: [place] AT, moist mucus membranes.  Trachea midline, no masses. Cardiovascular: No clubbing, cyanosis, or edema. Respiratory: Normal respiratory effort, no increased work of breathing. GI: Abdomen is soft, nontender, nondistended, no abdominal masses GU: No CVA tenderness. The patient has areas of firmness and several healing areas  with granuloma tissue from previous drainage sites.  No pus is expelled with vigorous palpation.  No crepitus. No sign of Fournier's. Rectal:  An area of firmness (2 mm x 10 mm) is located in the left perirectal area.  No fluctuance, crepitus or sign of Fournier's is noted.  It has an open area 5 mm x 1 mm where it appears to have spontaneously drained. Skin: No rashes, bruises or suspicious lesions. Lymph: No cervical or inguinal adenopathy. Neurologic: Grossly intact, no focal deficits, moving all 4 extremities. Psychiatric: Normal mood and affect.  Laboratory Data: Lab Results  Component Value Date   WBC 9.3 01/15/2016   HGB 13.1 01/15/2016   HCT 39.4 (L) 01/15/2016   MCV 87.7 01/15/2016   PLT 220 01/15/2016    Lab Results  Component Value Date   CREATININE 0.81 01/15/2016  Lab Results  Component Value Date   HGBA1C 5.7 (H) 11/11/2015    Assessment & Plan:    1. . Scrotal abscess s/p I & D  - discontinue clindamycin and start Ceftin for 10 days; patient will pick up the prescription tomorrow  - wound check in two weeks  2. Suppurative hydradenitis  - prescribed clindamycin gel as patient states that this has worked for him in the past  - placed referral to plastics at this time - he has not heard from the referral as of this appointment  - patient is on anticoagulants, but would like their input  3. History of perirectal abscess  - spontaneous drainage noted  - advised patient to seek treatment in the ED if it becomes painful or he starts to have fevers or chills  Return in about 2 weeks (around 03/02/2016) for wound recheck.  Michiel Cowboy, PA-C  [facility] [address] [place], [place] [address] [phone]

## 2016-02-23 ENCOUNTER — Telehealth: Payer: Self-pay | Admitting: Cardiovascular Disease

## 2016-02-23 MED ORDER — WARFARIN SODIUM 5 MG PO TABS: ORAL_TABLET | ORAL | 0 refills | Status: DC

## 2016-02-23 NOTE — Telephone Encounter (Signed)
°*  STAT* If patient is at the pharmacy, call can be transferred to refill team.   1. Which medications need to be refilled? (please list name of each medication and dose if known) warfin   2. Which pharmacy/location (including street and city if local pharmacy) is medication to be sent to?[facility] on [address]  3. Do they need a 30 day or 90 day supply? 30

## 2016-02-23 NOTE — Telephone Encounter (Signed)
Please review for refill. Thanks!  

## 2016-02-23 NOTE — Telephone Encounter (Signed)
Pt is overdue for Coumadin follow-up, last seen 01/21/16.  Pt is seeing Dr Mariah MillingGollan in office on 02/26/16 for follow-up at 10:40am.  Made pt appt for INR check while at office seeing Dr Mariah MillingGollan on 02/26/16.  Will refill pt's Warfarin x 1 only no refills until pt follows up in [facility].

## 2016-02-26 ENCOUNTER — Ambulatory Visit (INDEPENDENT_AMBULATORY_CARE_PROVIDER_SITE_OTHER): Payer: Medicaid Other

## 2016-02-26 ENCOUNTER — Encounter: Payer: Self-pay | Admitting: Cardiovascular Disease

## 2016-02-26 ENCOUNTER — Ambulatory Visit (INDEPENDENT_AMBULATORY_CARE_PROVIDER_SITE_OTHER): Payer: Medicaid Other | Admitting: Cardiovascular Disease

## 2016-02-26 VITALS — BP 150/110 | HR 73 | Ht 72.0 in | Wt 180.5 lb

## 2016-02-26 DIAGNOSIS — Z5181 Encounter for therapeutic drug level monitoring: Secondary | ICD-10-CM

## 2016-02-26 DIAGNOSIS — I255 Ischemic cardiomyopathy: Secondary | ICD-10-CM | POA: Diagnosis not present

## 2016-02-26 DIAGNOSIS — K611 Rectal abscess: Secondary | ICD-10-CM

## 2016-02-26 DIAGNOSIS — N280 Ischemia and infarction of kidney: Secondary | ICD-10-CM | POA: Diagnosis not present

## 2016-02-26 DIAGNOSIS — I513 Intracardiac thrombosis, not elsewhere classified: Secondary | ICD-10-CM | POA: Diagnosis not present

## 2016-02-26 DIAGNOSIS — Z7189 Other specified counseling: Secondary | ICD-10-CM | POA: Insufficient documentation

## 2016-02-26 DIAGNOSIS — I10 Essential (primary) hypertension: Secondary | ICD-10-CM

## 2016-02-26 DIAGNOSIS — E1159 Type 2 diabetes mellitus with other circulatory complications: Secondary | ICD-10-CM

## 2016-02-26 LAB — POCT INR: INR: 2

## 2016-02-26 MED ORDER — ISOSORBIDE MONONITRATE ER 30 MG PO TB24: 30.0000 mg | ORAL_TABLET | Freq: Every day | ORAL | 11 refills | Status: DC

## 2016-02-26 NOTE — Patient Instructions (Signed)
Medication Instructions:   Please stay on losartan one a day Stay on coreg twice a day  Please start isosorbide one a day for blood pressure, also opens up heart vessels  Labwork:  No new labs needed  Testing/Procedures:  No further testing at this time   I recommend watching educational videos on topics of interest to you at:       [email or web address]  Enter code: [email or web address]    Follow-Up: It was a pleasure seeing you in the office today. Please call us if you have new issues that need to be addressed before your next appt.  [phone]  Your physician wants you to follow-up in: 3 months.  You will receive a reminder letter in the mail two months in advance. If you don't receive a letter, please call our office to schedule the follow-up appointment.  If you need a refill on your cardiac medications before your next appointment, please call your pharmacy.

## 2016-02-26 NOTE — Progress Notes (Signed)
Cardiology Office Note  Date:  02/26/2016   ID:  Joshua Tate, DOB [birth year], MRN [id number]  PCP:  [facility]   Chief Complaint  Patient presents with  . OTHER    2 month f/u c/o arm soreness and legs/feet aching. Meds reviewed verbally with pt.    HPI:  41 y.o.malewith h/o recurrent perirectal and scrotal abscesses, HTN, and DM who presented to [facility] ED 11/10/2015 for evaluation of drainage from his scrotum and perineum, post-operative course  noted EKG changes along the anterolateral leads, Echocardiogram showing ejection fraction 20% with regions of wall motion abnormality,  patient had noted some increased fatigue. Follow-up cardiac catheterization10/16/2017, dr. Shirlee Limerick LAD lesion, 100 %stenosed.  Mid LAD to Dist LAD lesion, 30 %stenosed.  Dist Cx lesion, 40 %stenosed.  Ost 2nd Diag to 2nd Diag lesion, 100 %stenosed.  Ost 1st Mrg to 1st Mrg lesion, 70 %stenosed.  LV end diastolic pressure is moderately elevated.  Medical management recommended for occluded distal LAD with right-to-left collaterals from the right coronary artery. Also occluded second diagonal with faint collaterals. Borderline disease in OM1.  He presents today for follow-up of his ischemic cardiomyopathy  In follow-up, he reports that he is been compliant with his medications though has not been taking his warfarin for several weeks until recently He reports having ABD pain yesterday, concerned he might be having infarct, symptoms are better today, pain was right lower quadrant. Does not think it was GI related such as constipation Today, INR 2 He does report having SOB with exertion, Does not check his blood pressure at home, blood pressure today markedly elevated On previous visits was noncompliant with medications, especially when seen in the clinic Denies any significant PND orthopnea No lower extremity edema  Terms of his perirectal abscess, this is still draining He has  follow-up with surgery  He reports he is taking carvedilol and losartan as prescribed  Other past medical history reviewed Previoushospital admission for kidney embolism, presumably from mural thrombus Started on Lovenox, transition to warfarin    PMH:   has a past medical history of Abscess and Hypertension.  PSH:    Past Surgical History:  Procedure Laterality Date  . CARDIAC CATHETERIZATION Left 11/17/2015   Procedure: Left Heart Cath and Coronary Angiography;  Surgeon: Iran Ouch, MD;  Location: [facility] INVASIVE CV LAB;  Service: Cardiovascular;  Laterality: Left;  . EYE SURGERY    . INCISION AND DRAINAGE ABSCESS N/A 09/26/2015   Procedure: INCISION AND DRAINAGE perirectal  ABSCESS;  Surgeon: Leafy Ro, MD;  Location: [facility] ORS;  Service: General;  Laterality: N/A;  . INCISION AND DRAINAGE ABSCESS N/A 11/10/2015   Procedure: INCISION AND DRAINAGE ABSCESS;  Surgeon: Vanna Scotland, MD;  Location: [facility] ORS;  Service: Urology;  Laterality: N/A;    Current Outpatient Prescriptions  Medication Sig Dispense Refill  . atorvastatin (LIPITOR) 40 MG tablet Take 1 tablet (40 mg total) by mouth daily at 6 PM. 30 tablet 0  . carvedilol (COREG) 12.5 MG tablet Take 1 tablet (12.5 mg total) by mouth 2 (two) times daily with a meal. 60 tablet 11  . losartan (COZAAR) 100 MG tablet Take 1 tablet (100 mg total) by mouth daily. 30 tablet 11  . warfarin (COUMADIN) 5 MG tablet Take as directed by Coumadin Clinic 60 tablet 0  . cefUROXime (CEFTIN) 500 MG tablet Take 1 tablet (500 mg total) by mouth 2 (two) times daily with a meal. (Patient not taking: Reported  on 02/26/2016) 20 tablet 0  . clindamycin (CLEOCIN) 300 MG capsule Take 2 capsules (600 mg total) by mouth 3 (three) times daily. (Patient not taking: Reported on 02/26/2016) 60 capsule 0  . clindamycin (CLINDAGEL) 1 % gel Apply topically 2 (two) times daily. (Patient not taking: Reported on 02/26/2016) 30 g 0  . isosorbide mononitrate (IMDUR)  30 MG 24 hr tablet Take 1 tablet (30 mg total) by mouth daily. 30 tablet 11   No current facility-administered medications for this visit.      Allergies:   Aspirin and Latex   Social History:  The patient  reports that he has been smoking Cigars.  He has a 40.00 pack-year smoking history. He has never used smokeless tobacco. He reports that he drinks alcohol. He reports that he uses drugs, including Marijuana, about 7 times per week.   Family History:   family history includes CAD in his mother; Diabetes Mellitus II in his mother; Hypertension in his mother; Kidney disease in his mother; Lung cancer in his maternal grandmother; Prostate cancer in his paternal grandfather.    Review of Systems: Review of Systems  Constitutional: Negative.   Respiratory: Negative.   Cardiovascular: Negative.   Gastrointestinal: Negative.   Genitourinary:       Drainage from perirectal abscess  Musculoskeletal: Negative.   Neurological: Negative.   Psychiatric/Behavioral: Negative.   All other systems reviewed and are negative.    PHYSICAL EXAM: VS:  BP (!) 150/110 (BP Location: Left Arm, Patient Position: Sitting, Cuff Size: Normal)   Pulse 73   Ht 6' (1.829 m)   Wt 180 lb 8 oz (81.9 kg)   BMI 24.48 kg/m  , BMI Body mass index is 24.48 kg/m. GEN: Well nourished, well developed, in no acute distress  HEENT: normal  Neck: no JVD, carotid bruits, or masses Cardiac: RRR; no murmurs, rubs, or gallops,no edema  Respiratory:  clear to auscultation bilaterally, normal work of breathing GI: soft, nontender, nondistended, + BS MS: no deformity or atrophy  Skin: warm and dry, no rash, old track marks on his arms bilaterally  Neuro:  Strength and sensation are intact Psych: euthymic mood, full affect    Recent Labs: 12/03/2015: ALT 19 01/15/2016: BUN 7; Creatinine, Ser 0.81; Hemoglobin 13.1; Platelets 220; Potassium 3.7; Sodium 140    Lipid Panel Lab Results  Component Value Date   CHOL 104  11/11/2015   HDL 42 11/11/2015   LDLCALC 54 11/11/2015   TRIG 38 11/11/2015      Wt Readings from Last 3 Encounters:  02/26/16 180 lb 8 oz (81.9 kg)  02/17/16 185 lb 1.6 oz (84 kg)  02/09/16 179 lb 14.4 oz (81.6 kg)       ASSESSMENT AND PLAN:  Essential hypertension Blood pressure continues to run markedly high, recommended he stay on his losartan, carvedilol twice a day, add isosorbide 30 mg daily If blood pressure continues to run high recommended he call our office, we could increase isosorbide up to 30 mg twice a day  Ischemic cardiomyopathy Recommended smoking cessation, stay on his Lipitor Currently wit stable angina symptoms   Mural thrombus of cardiac apex  stressed importance of compliance with his warfarin  He is high risk of recurrent mural thrombus, stroke   Renal infarct (HCC)  as above, will try to maintain INR 2.0 or higher   Perirectal abscess  he has follow-up with surgery for continued drainage from abscess, was unable to afford antibiotics previously prescribed him  Type 2 diabetes mellitus with other circulatory complication, without long-term current use of insulin (HCC) We have encouraged continued exercise, careful diet management   Encounter for anticoagulation discussion and counseling Stressed importance of following his warfarin, staying on the medication   Total encounter time more than 25 minutes  Greater than 50% was spent in counseling and coordination of care with the patient   Disposition:   F/U  6 months  No orders of the defined types were placed in this encounter.    Signed, Dossie Arbourim Aryan Bello, M.D., Ph.D. 02/26/2016  [facility], [place] [phone]

## 2016-02-27 ENCOUNTER — Ambulatory Visit: Payer: Medicaid Other | Admitting: Cardiovascular Disease

## 2016-03-03 ENCOUNTER — Encounter: Payer: Self-pay | Admitting: Urology

## 2016-03-03 ENCOUNTER — Ambulatory Visit (INDEPENDENT_AMBULATORY_CARE_PROVIDER_SITE_OTHER): Payer: Medicaid Other | Admitting: Urology

## 2016-03-03 VITALS — BP 146/93 | HR 71 | Ht 72.0 in | Wt 181.7 lb

## 2016-03-03 DIAGNOSIS — L02215 Cutaneous abscess of perineum: Secondary | ICD-10-CM | POA: Diagnosis not present

## 2016-03-03 DIAGNOSIS — N492 Inflammatory disorders of scrotum: Secondary | ICD-10-CM

## 2016-03-03 DIAGNOSIS — L732 Hidradenitis suppurativa: Secondary | ICD-10-CM

## 2016-03-03 MED ORDER — CLINDAMYCIN PHOSPHATE 1 % EX GEL: Freq: Two times a day (BID) | CUTANEOUS | 3 refills | Status: DC

## 2016-03-03 NOTE — Progress Notes (Signed)
03/03/2016 4:03 PM   Joshua Tate 11-29-1975 [id number]  [facility] [address] [place], [place] [id number]  Chief Complaint  Patient presents with  . Wound Check    HPI: Patient was a 41 year old [place] male who presents today for a wound recheck.  Background history 41 year old male with chronic hidradenitis suppurativa who returns to the office today after being sent from the general surgery office for concern for left scrotal abscess recurrence. He has a complex perirectal, perineal, and left scrotal abscess status post I&D on 11/10/2015.   He returns today with drainage from his scrotum concerning for abscess recurrence. He has noted drainage from his left hemiscrotum for a couple days now. He is very tender to palpation. No fevers or chills. It feels like previous abscesses. He is currently on keflex which he has been on for over a month.  Patient has his last abscess drainage on 01/29/2016.  He has removed all the packing.  Wound culture grew out Prevotella intermedia.  This organism is usually resistant to PCN.    Today, he is complaining of frequency and nocturia.  These are baseline symptoms.  He has not had any pain in the perineal or scrotal area.  He has had scant amount of drainage.  He has not had fevers, chills, nausea or vomiting.  He is using the Cleocin gel and is taking the Ceftin.  He stated [facility] had contacted him for an appointment, but he could not make the visit due to transportation issues.  He will call to reschedule.    PMH is significant for ischemic cardiomyopathy, mural thrombus of cardiac apex, renal infarct and Type 2 DM.       PMH: Past Medical History:  Diagnosis Date  . Abscess    to rectum  . Hypertension     Surgical History: Past Surgical History:  Procedure Laterality Date  . CARDIAC CATHETERIZATION Left 11/17/2015   Procedure: Left Heart Cath and Coronary Angiography;  Surgeon: Iran OuchMuhammad A  Arida, MD;  Location: [facility] INVASIVE CV LAB;  Service: Cardiovascular;  Laterality: Left;  . EYE SURGERY    . INCISION AND DRAINAGE ABSCESS N/A 09/26/2015   Procedure: INCISION AND DRAINAGE perirectal  ABSCESS;  Surgeon: Leafy Roiego F Pabon, MD;  Location: [facility] ORS;  Service: General;  Laterality: N/A;  . INCISION AND DRAINAGE ABSCESS N/A 11/10/2015   Procedure: INCISION AND DRAINAGE ABSCESS;  Surgeon: Vanna ScotlandAshley Brandon, MD;  Location: [facility] ORS;  Service: Urology;  Laterality: N/A;    Home Medications:  Allergies as of 03/03/2016      Reactions   Aspirin    Other reaction(s): Other (See Comments) pt states not allergic to asa.   Latex Rash      Medication List       Accurate as of 03/03/16  4:03 PM. Always use your most recent med list.          atorvastatin 40 MG tablet Commonly known as:  LIPITOR Take 1 tablet (40 mg total) by mouth daily at 6 PM.   carvedilol 12.5 MG tablet Commonly known as:  COREG Take 1 tablet (12.5 mg total) by mouth 2 (two) times daily with a meal.   cefUROXime 500 MG tablet Commonly known as:  CEFTIN Take 1 tablet (500 mg total) by mouth 2 (two) times daily with a meal.   clindamycin 1 % gel Commonly known as:  CLINDAGEL Apply topically 2 (two) times daily.   clindamycin 300  MG capsule Commonly known as:  CLEOCIN Take 2 capsules (600 mg total) by mouth 3 (three) times daily.   isosorbide mononitrate 30 MG 24 hr tablet Commonly known as:  IMDUR Take 1 tablet (30 mg total) by mouth daily.   losartan 100 MG tablet Commonly known as:  COZAAR Take 1 tablet (100 mg total) by mouth daily.   warfarin 5 MG tablet Commonly known as:  COUMADIN Take as directed by Coumadin Clinic       Allergies:  Allergies  Allergen Reactions  . Aspirin     Other reaction(s): Other (See Comments) pt states not allergic to asa.  . Latex Rash    Family History: Family History  Problem Relation Age of Onset  . CAD Mother     a. MI age 81  . Hypertension Mother     . Diabetes Mellitus II Mother   . Kidney disease Mother     Dialysis  . Lung cancer Maternal Grandmother   . Prostate cancer Paternal Grandfather   . Hematuria Neg Hx   . Bladder Cancer Neg Hx     Social History:  reports that he has been smoking Cigars.  He has a 40.00 pack-year smoking history. He has never used smokeless tobacco. He reports that he drinks alcohol. He reports that he uses drugs, including Marijuana, about 7 times per week.  ROS: UROLOGY Frequent Urination?: Yes Hard to postpone urination?: No Burning/pain with urination?: No Get up at night to urinate?: Yes Leakage of urine?: No Urine stream starts and stops?: No Trouble starting stream?: No Do you have to strain to urinate?: No Blood in urine?: No Urinary tract infection?: No Sexually transmitted disease?: No Injury to kidneys or bladder?: No Painful intercourse?: No Weak stream?: No Erection problems?: No Penile pain?: No  Gastrointestinal Nausea?: No Vomiting?: No Indigestion/heartburn?: No Diarrhea?: No Constipation?: No  Constitutional Fever: No Night sweats?: No Weight loss?: No Fatigue?: No  Skin Skin rash/lesions?: No Itching?: No  Eyes Blurred vision?: No Double vision?: No  Ears/Nose/Throat Sore throat?: No Sinus problems?: No  Hematologic/Lymphatic Swollen glands?: No Easy bruising?: No  Cardiovascular Leg swelling?: No Chest pain?: No  Respiratory Cough?: No Shortness of breath?: Yes  Endocrine Excessive thirst?: No  Musculoskeletal Back pain?: No Joint pain?: No  Neurological Headaches?: No Dizziness?: No  Psychologic Depression?: No Anxiety?: No  Physical Exam: BP (!) 146/93 (BP Location: Left Arm, Patient Position: Sitting, Cuff Size: Normal)   Pulse 71   Ht 6' (1.829 m)   Wt 181 lb 11.2 oz (82.4 kg)   BMI 24.64 kg/m   Constitutional:  Alert and oriented, No acute distress. HEENT: [place] AT, moist mucus membranes.  Trachea midline, no  masses. Cardiovascular: No clubbing, cyanosis, or edema. Respiratory: Normal respiratory effort, no increased work of breathing. GI: Abdomen is soft, nontender, nondistended, no abdominal masses GU: No CVA tenderness. The patient has areas of firmness and several healing areas with granuloma tissue from previous drainage sites.  No pus is expelled with vigorous palpation.  No crepitus. No sign of Fournier's. Rectal:  An area of firmness (2 mm x 10 mm) is located in the left perirectal area.  No fluctuance, crepitus or sign of Fournier's is noted.  It has an open area 5 mm x 1 mm.   Skin: No rashes, bruises or suspicious lesions. Lymph: No cervical or inguinal adenopathy. Neurologic: Grossly intact, no focal deficits, moving all 4 extremities. Psychiatric: Normal mood and affect.  Laboratory Data: Lab  Results  Component Value Date   WBC 9.3 01/15/2016   HGB 13.1 01/15/2016   HCT 39.4 (L) 01/15/2016   MCV 87.7 01/15/2016   PLT 220 01/15/2016    Lab Results  Component Value Date   CREATININE 0.81 01/15/2016    Lab Results  Component Value Date   HGBA1C 5.7 (H) 11/11/2015    Assessment & Plan:    1. . Scrotal abscess s/p I & D  - stable at this time  - RTC if abscess returns  2. Suppurative hydradenitis  - continue clindamycin gel   - placed referral to plastics at this time - he will need to reschedule the appointment  - patient is on anticoagulants, but would like plastics input  3. History of perirectal abscess  - stable  Return if symptoms worsen or fail to improve.  Michiel Cowboy, PA-C  [facility] [address] [place], [place] [address] [phone]

## 2016-03-08 ENCOUNTER — Other Ambulatory Visit: Payer: Self-pay | Admitting: Cardiovascular Disease

## 2016-05-26 ENCOUNTER — Ambulatory Visit: Payer: Medicaid Other | Admitting: Cardiovascular Disease

## 2016-05-26 NOTE — Progress Notes (Signed)
Cardiology Office Note  Date:  05/27/2016   ID:  NESTA KIMPLE, DOB 12/20/1975, MRN [id number]  PCP:  [facility]   Chief Complaint  Patient presents with  . OTHER    3 month f/u c/o elevated BP. Pt is not taking any medications at the moment. Meds reviewed verbally with pt.    HPI:  41 y.o.malewith h/o  recurrent perirectal and scrotal abscesses,  Ischemic cardiomyopathy ejection fraction 20% kidney embolism, presumably from mural thrombus HTN,  DM  who presented to [facility] ED 11/10/2015 for evaluation of drainage from his scrotum and perineum, post-operative course  noted EKG changes along the anterolateral leads, Echocardiogram showing ejection fraction 20% with regions of wall motion abnormality, Medication noncompliance He presents today for follow-up of his ischemic cardiomyopathy He has 6 children some are grown, one in kindergarten   cardiac catheterization10/16/2017, dr. Shirlee Limerick LAD lesion, 100 %stenosed.  Mid LAD to Dist LAD lesion, 30 %stenosed.  Dist Cx lesion, 40 %stenosed.  Ost 2nd Diag to 2nd Diag lesion, 100 %stenosed.  Ost 1st Mrg to 1st Mrg lesion, 70 %stenosed.  LV end diastolic pressure is moderately elevated.  Medical management recommended for occluded distal LAD with right-to-left collaterals from the right coronary artery. Also occluded second diagonal with faint collaterals. Borderline disease in OM1.    not been taking his warfarin for several weeks  Reports that he ran out, missed his last Coumadin clinic appointment Also thinks that he is out of his carvedilol, "needs refills" We did show him there are 11 refills pending Reports it may have interfered with his "sex life" Denies having significant shortness of breath on exertion   perirectal abscess, this is still draining He has follow-up with surgery  Other past medical history reviewed Previoushospital admission for kidney embolism, presumably from mural  thrombus Started on Lovenox, transition to warfarin    PMH:   has a past medical history of Abscess and Hypertension.  PSH:    Past Surgical History:  Procedure Laterality Date  . CARDIAC CATHETERIZATION Left 11/17/2015   Procedure: Left Heart Cath and Coronary Angiography;  Surgeon: Iran Ouch, MD;  Location: [facility] INVASIVE CV LAB;  Service: Cardiovascular;  Laterality: Left;  . EYE SURGERY    . INCISION AND DRAINAGE ABSCESS N/A 09/26/2015   Procedure: INCISION AND DRAINAGE perirectal  ABSCESS;  Surgeon: Leafy Ro, MD;  Location: [facility] ORS;  Service: General;  Laterality: N/A;  . INCISION AND DRAINAGE ABSCESS N/A 11/10/2015   Procedure: INCISION AND DRAINAGE ABSCESS;  Surgeon: Vanna Scotland, MD;  Location: [facility] ORS;  Service: Urology;  Laterality: N/A;    Current Outpatient Prescriptions  Medication Sig Dispense Refill  . atorvastatin (LIPITOR) 40 MG tablet TAKE ONE TABLET BY MOUTH ONCE DAILY AT  6PM 30 tablet 6  . carvedilol (COREG) 12.5 MG tablet Take 1 tablet (12.5 mg total) by mouth 2 (two) times daily with a meal. 60 tablet 6  . cefUROXime (CEFTIN) 500 MG tablet Take 1 tablet (500 mg total) by mouth 2 (two) times daily with a meal. (Patient not taking: Reported on 05/27/2016) 20 tablet 0  . clindamycin (CLINDAGEL) 1 % gel Apply topically 2 (two) times daily. (Patient not taking: Reported on 05/27/2016) 30 g 3  . isosorbide mononitrate (IMDUR) 30 MG 24 hr tablet Take 1 tablet (30 mg total) by mouth daily. (Patient not taking: Reported on 05/27/2016) 30 tablet 11  . losartan (COZAAR) 100 MG tablet Take 1 tablet (100 mg  total) by mouth daily. 30 tablet 6  . warfarin (COUMADIN) 5 MG tablet Take as directed by Coumadin Clinic 60 tablet 0   No current facility-administered medications for this visit.      Allergies:   Aspirin and Latex   Social History:  The patient  reports that he has been smoking Cigars.  He has a 40.00 pack-year smoking history. He has never used smokeless  tobacco. He reports that he drinks alcohol. He reports that he uses drugs, including Marijuana, about 7 times per week.   Family History:   family history includes CAD in his mother; Diabetes Mellitus II in his mother; Hypertension in his mother; Kidney disease in his mother; Lung cancer in his maternal grandmother; Prostate cancer in his paternal grandfather.    Review of Systems: Review of Systems  Constitutional: Negative.   Respiratory: Negative.   Cardiovascular: Negative.   Gastrointestinal: Negative.   Genitourinary:       Drainage from perirectal abscess  Musculoskeletal: Negative.   Neurological: Negative.   Psychiatric/Behavioral: Negative.   All other systems reviewed and are negative.    PHYSICAL EXAM: VS:  BP (!) 146/92 (BP Location: Left Arm, Patient Position: Sitting, Cuff Size: Normal)   Pulse 80   Ht 6' (1.829 m)   Wt 183 lb 8 oz (83.2 kg)   BMI 24.89 kg/m  , BMI Body mass index is 24.89 kg/m. GEN: Well nourished, well developed, in no acute distress  HEENT: normal  Neck: no JVD, carotid bruits, or masses Cardiac: RRR; no murmurs, rubs, or gallops,no edema  Respiratory:  clear to auscultation bilaterally, normal work of breathing GI: soft, nontender, nondistended, + BS MS: no deformity or atrophy  Skin: warm and dry, no rash, old track marks on his arms bilaterally  Neuro:  Strength and sensation are intact Psych: euthymic mood, full affect    Recent Labs: 12/03/2015: ALT 19 01/15/2016: BUN 7; Creatinine, Ser 0.81; Hemoglobin 13.1; Platelets 220; Potassium 3.7; Sodium 140    Lipid Panel Lab Results  Component Value Date   CHOL 104 11/11/2015   HDL 42 11/11/2015   LDLCALC 54 11/11/2015   TRIG 38 11/11/2015      Wt Readings from Last 3 Encounters:  05/27/16 183 lb 8 oz (83.2 kg)  03/03/16 181 lb 11.2 oz (82.4 kg)  02/26/16 180 lb 8 oz (81.9 kg)       ASSESSMENT AND PLAN:  Essential hypertension Recommended he restart his  medications Medication noncompliance We have sent in refills No changes made to his medications, suggested he continue carvedilol, isosorbide, losartan  Ischemic cardiomyopathy Recommended smoking cessation,  stay on his Lipitor, he has not been taking this Denies anginal symptoms If he was compliant with his medications we would start him on entresto.    Mural thrombus of cardiac apex  stressed importance of compliance with his warfarin  He is high risk of recurrent mural thrombus, stroke  Missed last appointment  Renal infarct [facility])  as above, will try to maintain INR 2.0 or higher   Perirectal abscess  he has follow-up with surgery for continued drainage from abscess, was unable to afford antibiotics previously prescribed him   Type 2 diabetes mellitus with other circulatory complication, without long-term current use of insulin (HCC) We have encouraged continued exercise, careful diet management   Encounter for anticoagulation discussion and counseling Stressed importance of following his warfarin, staying on the medication  Erectile dysfunction Would likely not qualify for Viagra as he is  taking isosorbide If he does want medications for ED, we would need to stop isosorbide He would need alternate medication for blood pressure control   Total encounter time more than 25 minutes  Greater than 50% was spent in counseling and coordination of care with the patient   Disposition:   F/U  6 months  No orders of the defined types were placed in this encounter.    Signed, Dossie Arbour, M.D., Ph.D. 05/27/2016  [facility] [place], [place] [phone]

## 2016-05-27 ENCOUNTER — Encounter: Payer: Self-pay | Admitting: Cardiovascular Disease

## 2016-05-27 ENCOUNTER — Ambulatory Visit (INDEPENDENT_AMBULATORY_CARE_PROVIDER_SITE_OTHER): Payer: Medicaid Other | Admitting: Cardiovascular Disease

## 2016-05-27 VITALS — BP 146/92 | HR 80 | Ht 72.0 in | Wt 183.5 lb

## 2016-05-27 DIAGNOSIS — E1159 Type 2 diabetes mellitus with other circulatory complications: Secondary | ICD-10-CM | POA: Diagnosis not present

## 2016-05-27 DIAGNOSIS — Z7189 Other specified counseling: Secondary | ICD-10-CM | POA: Diagnosis not present

## 2016-05-27 DIAGNOSIS — I255 Ischemic cardiomyopathy: Secondary | ICD-10-CM

## 2016-05-27 DIAGNOSIS — I10 Essential (primary) hypertension: Secondary | ICD-10-CM | POA: Diagnosis not present

## 2016-05-27 DIAGNOSIS — I200 Unstable angina: Secondary | ICD-10-CM

## 2016-05-27 MED ORDER — LOSARTAN POTASSIUM 100 MG PO TABS: 100.0000 mg | ORAL_TABLET | Freq: Every day | ORAL | 6 refills | Status: DC

## 2016-05-27 MED ORDER — ATORVASTATIN CALCIUM 40 MG PO TABS: ORAL_TABLET | ORAL | 6 refills | Status: DC

## 2016-05-27 MED ORDER — CARVEDILOL 12.5 MG PO TABS: 12.5000 mg | ORAL_TABLET | Freq: Two times a day (BID) | ORAL | 6 refills | Status: DC

## 2016-05-27 MED ORDER — WARFARIN SODIUM 5 MG PO TABS: ORAL_TABLET | ORAL | 0 refills | Status: DC

## 2016-05-27 NOTE — Patient Instructions (Signed)
Medication Instructions:   No medication changes made  We will renew your medication  Labwork:  No new labs needed  Testing/Procedures:  No further testing at this time   I recommend watching educational videos on topics of interest to you at:       [email or web address]  Enter code: [email or web address]    Follow-Up: It was a pleasure seeing you in the office today. Please call us if you have new issues that need to be addressed before your next appt.  [phone]  Your physician wants you to follow-up in: 6 months.  You will receive a reminder letter in the mail two months in advance. If you don't receive a letter, please call our office to schedule the follow-up appointment.  If you need a refill on your cardiac medications before your next appointment, please call your pharmacy.

## 2016-06-01 ENCOUNTER — Telehealth: Payer: Self-pay | Admitting: Cardiovascular Disease

## 2016-06-01 NOTE — Telephone Encounter (Signed)
Received cardiac clearance request for pt to proceed w/ urologic surgery, specifically excision of skin or hydrdenitis on 06/16/16 @ [facility]. Please provide instruction regarding when pt may come off of warfarin prior to surgery and instructions on bridging, if necessary.  Please fax to [phone], attn: Urology Surgery Scheduler.

## 2016-06-01 NOTE — Telephone Encounter (Signed)
Routed to fax # provided. 

## 2016-06-01 NOTE — Telephone Encounter (Signed)
Acceptable risk for urologic procedure Would stop warfarin 5 days prior to procedure He has been subtherapeutic  secondary to noncompliance with warfarin

## 2016-06-09 ENCOUNTER — Ambulatory Visit (INDEPENDENT_AMBULATORY_CARE_PROVIDER_SITE_OTHER): Payer: Medicaid Other

## 2016-06-09 DIAGNOSIS — I513 Intracardiac thrombosis, not elsewhere classified: Secondary | ICD-10-CM

## 2016-06-09 DIAGNOSIS — Z5181 Encounter for therapeutic drug level monitoring: Secondary | ICD-10-CM | POA: Diagnosis not present

## 2016-06-09 LAB — POCT INR: INR: 1.1

## 2016-06-23 ENCOUNTER — Ambulatory Visit (INDEPENDENT_AMBULATORY_CARE_PROVIDER_SITE_OTHER): Payer: Medicaid Other

## 2016-06-23 DIAGNOSIS — Z5181 Encounter for therapeutic drug level monitoring: Secondary | ICD-10-CM

## 2016-06-23 DIAGNOSIS — I513 Intracardiac thrombosis, not elsewhere classified: Secondary | ICD-10-CM

## 2016-06-23 LAB — POCT INR: INR: 1.2

## 2016-06-30 ENCOUNTER — Ambulatory Visit (INDEPENDENT_AMBULATORY_CARE_PROVIDER_SITE_OTHER): Payer: Medicaid Other

## 2016-06-30 DIAGNOSIS — Z5181 Encounter for therapeutic drug level monitoring: Secondary | ICD-10-CM

## 2016-06-30 DIAGNOSIS — I513 Intracardiac thrombosis, not elsewhere classified: Secondary | ICD-10-CM

## 2016-06-30 LAB — POCT INR: INR: 2.8

## 2016-07-13 ENCOUNTER — Ambulatory Visit: Payer: Medicaid Other | Admitting: Urology

## 2016-07-14 ENCOUNTER — Ambulatory Visit (INDEPENDENT_AMBULATORY_CARE_PROVIDER_SITE_OTHER): Payer: Medicaid Other

## 2016-07-14 DIAGNOSIS — Z5181 Encounter for therapeutic drug level monitoring: Secondary | ICD-10-CM | POA: Diagnosis not present

## 2016-07-14 DIAGNOSIS — I513 Intracardiac thrombosis, not elsewhere classified: Secondary | ICD-10-CM | POA: Diagnosis not present

## 2016-07-14 LAB — POCT INR: INR: 2.4

## 2016-07-18 NOTE — Progress Notes (Deleted)
07/19/2016 11:21 PM   Adonis HousekeeperJohn G Nierman 1975/10/03 [id number]  Ref[facility] provider[facility] [address] [place], [place] [id number]  No chief complaint on file.   HPI: Patient was a 41 year old African American male who presents today for a wound recheck.  Background history 41 year old male with chronic hidradenitis suppurativa who returns to the office today after being sent from the general surgery office for concern for left scrotal abscess recurrence. He has a complex perirectal, perineal, and left scrotal abscess status post I&D on 11/10/2015.   He returns today with drainage from his scrotum concerning for abscess recurrence. He has noted drainage from his left hemiscrotum for a couple days now. He is very tender to palpation. No fevers or chills. It feels like previous abscesses. He is currently on keflex which he has been on for over a month.  Patient has his last abscess drainage on 01/29/2016.  He has removed all the packing.  Wound culture grew out Prevotella intermedia.  This organism is usually resistant to PCN.    Today, he is complaining of frequency and nocturia.  These are baseline symptoms.  He has not had any pain in the perineal or scrotal area.  He has had scant amount of drainage.  He has not had fevers, chills, nausea or vomiting.  He is using the Cleocin gel and is taking the Ceftin.  He stated [facility] had contacted him for an appointment, but he could not make the visit due to transportation issues.  He will call to reschedule.    PMH is significant for ischemic cardiomyopathy, mural thrombus of cardiac apex, renal infarct and Type 2 DM.       PMH: Past Medical History:  Diagnosis Date  . Abscess    to rectum  . Hypertension     Surgical History: Past Surgical History:  Procedure Laterality Date  . CARDIAC CATHETERIZATION Left 11/17/2015   Procedure: Left Heart Cath and Coronary Angiography;  Surgeon: Iran OuchMuhammad A Arida, MD;  Location: [facility]  INVASIVE CV LAB;  Service: Cardiovascular;  Laterality: Left;  . EYE SURGERY    . INCISION AND DRAINAGE ABSCESS N/A 09/26/2015   Procedure: INCISION AND DRAINAGE perirectal  ABSCESS;  Surgeon: Leafy Roiego F Pabon, MD;  Location: [facility] ORS;  Service: General;  Laterality: N/A;  . INCISION AND DRAINAGE ABSCESS N/A 11/10/2015   Procedure: INCISION AND DRAINAGE ABSCESS;  Surgeon: Vanna ScotlandAshley Brandon, MD;  Location: [facility] ORS;  Service: Urology;  Laterality: N/A;    Home Medications:  Allergies as of 07/19/2016      Reactions   Aspirin    Other reaction(s): Other (See Comments) pt states not allergic to asa.   Latex Rash      Medication List       Accurate as of 07/18/16 11:21 PM. Always use your most recent med list.          atorvastatin 40 MG tablet Commonly known as:  LIPITOR TAKE ONE TABLET BY MOUTH ONCE DAILY AT  6PM   carvedilol 12.5 MG tablet Commonly known as:  COREG Take 1 tablet (12.5 mg total) by mouth 2 (two) times daily with a meal.   cefUROXime 500 MG tablet Commonly known as:  CEFTIN Take 1 tablet (500 mg total) by mouth 2 (two) times daily with a meal.   clindamycin 1 % gel Commonly known as:  CLINDAGEL Apply topically 2 (two) times daily.   isosorbide mononitrate 30 MG 24 hr tablet Commonly known as:  IMDUR Take 1 tablet (30 mg total) by mouth daily.   losartan 100 MG tablet Commonly known as:  COZAAR Take 1 tablet (100 mg total) by mouth daily.   warfarin 5 MG tablet Commonly known as:  COUMADIN Take as directed by [facility] Clinic       Allergies:  Allergies  Allergen Reactions  . Aspirin     Other reaction(s): Other (See Comments) pt states not allergic to asa.  . Latex Rash    Family History: Family History  Problem Relation Age of Onset  . CAD Mother        a. MI age 64  . Hypertension Mother   . Diabetes Mellitus II Mother   . Kidney disease Mother        Dialysis  . Lung cancer Maternal Grandmother   . Prostate cancer Paternal Grandfather     . Hematuria Neg Hx   . Bladder Cancer Neg Hx     Social History:  reports that he has been smoking Cigars.  He has a 40.00 pack-year smoking history. He has never used smokeless tobacco. He reports that he drinks alcohol. He reports that he uses drugs, including Marijuana, about 7 times per week.  ROS:                                        Physical Exam: There were no vitals taken for this visit.  Constitutional:  Alert and oriented, No acute distress. HEENT: [place] AT, moist mucus membranes.  Trachea midline, no masses. Cardiovascular: No clubbing, cyanosis, or edema. Respiratory: Normal respiratory effort, no increased work of breathing. GI: Abdomen is soft, nontender, nondistended, no abdominal masses GU: No CVA tenderness. The patient has areas of firmness and several healing areas with granuloma tissue from previous drainage sites.  No pus is expelled with vigorous palpation.  No crepitus. No sign of Fournier's. Rectal:  An area of firmness (2 mm x 10 mm) is located in the left perirectal area.  No fluctuance, crepitus or sign of Fournier's is noted.  It has an open area 5 mm x 1 mm.   Skin: No rashes, bruises or suspicious lesions. Lymph: No cervical or inguinal adenopathy. Neurologic: Grossly intact, no focal deficits, moving all 4 extremities. Psychiatric: Normal mood and affect.  Laboratory Data: Lab Results  Component Value Date   WBC 9.3 01/15/2016   HGB 13.1 01/15/2016   HCT 39.4 (L) 01/15/2016   MCV 87.7 01/15/2016   PLT 220 01/15/2016    Lab Results  Component Value Date   CREATININE 0.81 01/15/2016    Lab Results  Component Value Date   HGBA1C 5.7 (H) 11/11/2015    Assessment & Plan:    1. . Scrotal abscess s/p I & D  - stable at this time  - RTC if abscess returns  2. Suppurative hydradenitis  - continue clindamycin gel   - placed referral to plastics at this time - he will need to reschedule the appointment  - patient is on  anticoagulants, but would like plastics input  3. History of perirectal abscess  - stable  No Follow-up on file.  Michiel Cowboy, PA-C  [facility] [address] [place], [place] [address] [phone]

## 2016-07-19 ENCOUNTER — Ambulatory Visit: Payer: Medicaid Other | Admitting: Urology

## 2016-08-05 ENCOUNTER — Other Ambulatory Visit: Payer: Self-pay | Admitting: Cardiovascular Disease

## 2016-08-05 NOTE — Telephone Encounter (Signed)
Please review for refill. Thanks!  

## 2016-08-21 ENCOUNTER — Other Ambulatory Visit: Payer: Self-pay | Admitting: Cardiovascular Disease

## 2016-08-23 NOTE — Telephone Encounter (Signed)
Refill Request.  

## 2016-10-27 ENCOUNTER — Telehealth: Payer: Self-pay

## 2016-10-27 NOTE — Telephone Encounter (Signed)
Pt is overdue for coumadin check. Attempted to contact him to schedule appt, but a young girl answered his phone and said he is not home. Asked her to have him call back.

## 2016-12-22 ENCOUNTER — Ambulatory Visit (INDEPENDENT_AMBULATORY_CARE_PROVIDER_SITE_OTHER): Payer: Medicaid Other

## 2016-12-22 DIAGNOSIS — Z5181 Encounter for therapeutic drug level monitoring: Secondary | ICD-10-CM

## 2016-12-22 DIAGNOSIS — I513 Intracardiac thrombosis, not elsewhere classified: Secondary | ICD-10-CM | POA: Diagnosis not present

## 2016-12-22 LAB — POCT INR: INR: 1.2

## 2016-12-22 MED ORDER — WARFARIN SODIUM 5 MG PO TABS: ORAL_TABLET | ORAL | 0 refills | Status: DC

## 2016-12-22 NOTE — Patient Instructions (Addendum)
Please take 2 tablets today, then resume taking 1 tablet daily. Recheck INR on 2 weeks.

## 2017-01-05 ENCOUNTER — Ambulatory Visit (INDEPENDENT_AMBULATORY_CARE_PROVIDER_SITE_OTHER): Payer: Medicaid Other

## 2017-01-05 DIAGNOSIS — I513 Intracardiac thrombosis, not elsewhere classified: Secondary | ICD-10-CM | POA: Diagnosis not present

## 2017-01-05 DIAGNOSIS — Z5181 Encounter for therapeutic drug level monitoring: Secondary | ICD-10-CM

## 2017-01-05 LAB — POCT INR: INR: 1.8

## 2017-01-05 NOTE — Patient Instructions (Signed)
Please take an extra tablets tonight, then start new dosage of 1 tablet daily except 2 tablets on Mondays and Fridays. Recheck INR on 2 weeks.

## 2017-02-14 ENCOUNTER — Ambulatory Visit (INDEPENDENT_AMBULATORY_CARE_PROVIDER_SITE_OTHER): Payer: Medicaid Other

## 2017-02-14 DIAGNOSIS — Z5181 Encounter for therapeutic drug level monitoring: Secondary | ICD-10-CM | POA: Diagnosis not present

## 2017-02-14 DIAGNOSIS — I513 Intracardiac thrombosis, not elsewhere classified: Secondary | ICD-10-CM

## 2017-02-14 LAB — POCT INR: INR: 1.3

## 2017-02-14 MED ORDER — WARFARIN SODIUM 5 MG PO TABS: ORAL_TABLET | ORAL | 0 refills | Status: DC

## 2017-02-14 NOTE — Patient Instructions (Signed)
Please take an extra tablet tonight, take 2 tablets tomorrow, then start new dosage of 1 tablet daily except 2 tablets on Mondays, Wednesdays and Fridays. Recheck INR on 2 weeks.

## 2017-02-28 ENCOUNTER — Other Ambulatory Visit: Payer: Self-pay | Admitting: Cardiovascular Disease

## 2017-02-28 NOTE — Telephone Encounter (Signed)
Please review for refill, Thanks !  

## 2017-05-08 IMAGING — CT CT CTA ABD/PEL W/CM AND/OR W/O CM
2 of 9 series · 12 of 46 positions shown, 17 images · IV contrast (isovue)
Comparison: 11/10/2015

CLINICAL DATA: Abdominal pain with nausea and vomiting since last
night

EXAM:
CTA ABDOMEN AND PELVIS wITHOUT AND WITH CONTRAST
TECHNIQUE: Multidetector CT imaging of the abdomen and pelvis was performed
using the standard protocol during bolus administration of
intravenous contrast. Multiplanar reconstructed images and MIPs were
obtained and reviewed to evaluate the vascular anatomy.
CONTRAST:  100 cc Isovue 370

[Series 5: axial venous · axial · portal-venous · 0.81mm/px · z∈[-541,-137]mm · 10 of 236 slices shown, 15 images]
[im 17/236  soft-tissue]
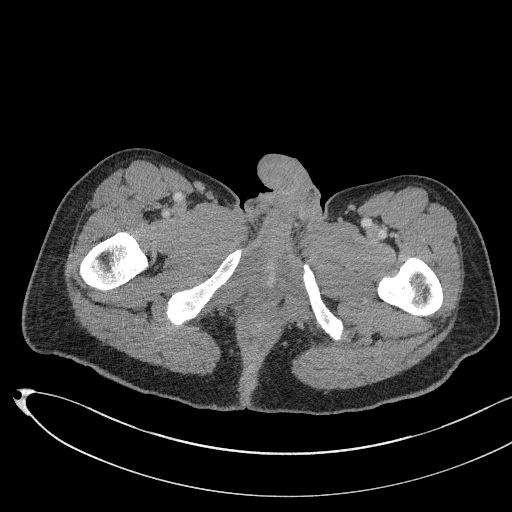
[im 17/236  bone]
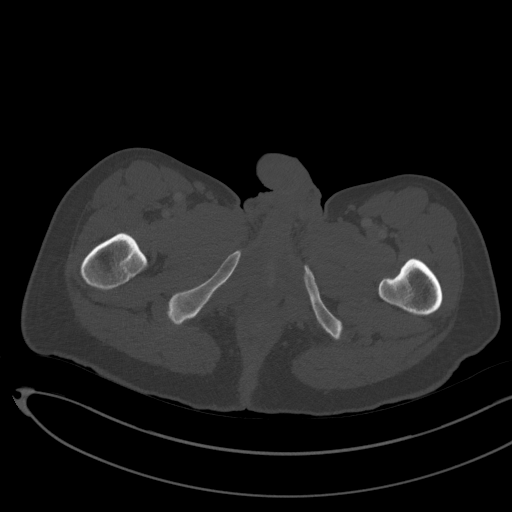
[im 51/236  soft-tissue]
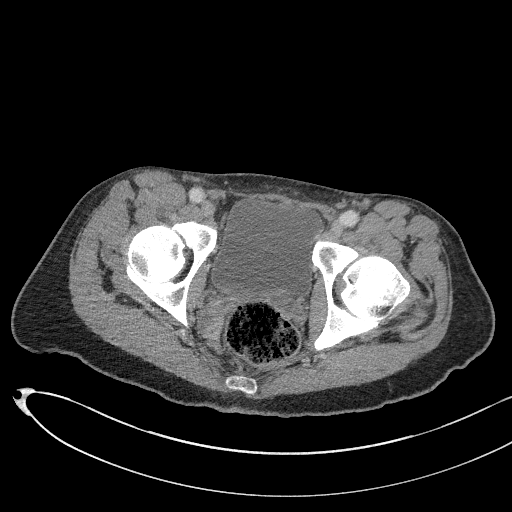
[im 68/236  soft-tissue]
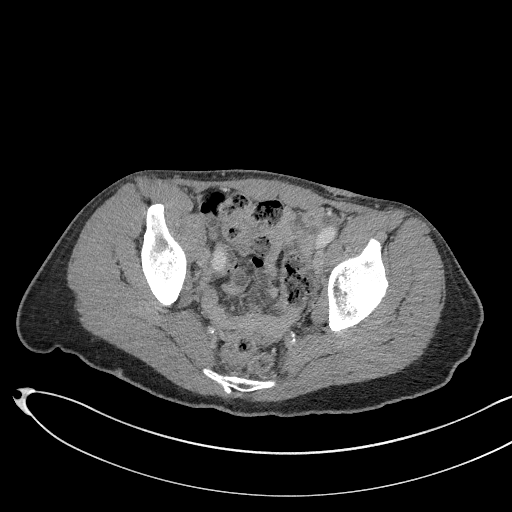
[im 101/236  soft-tissue]
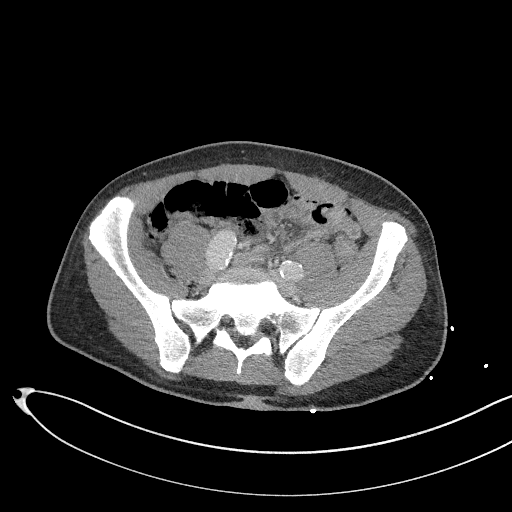
[im 118/236  soft-tissue]
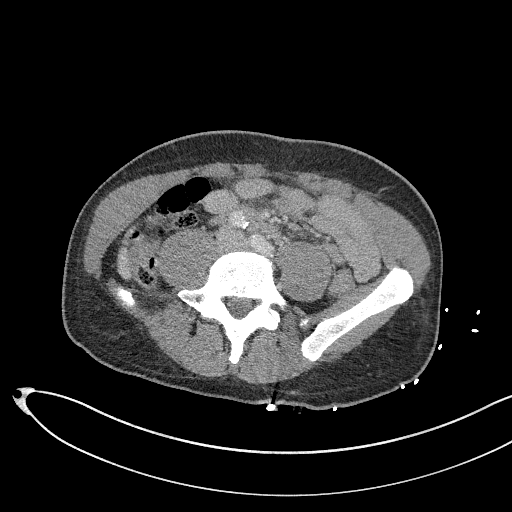
[im 135/236  soft-tissue]
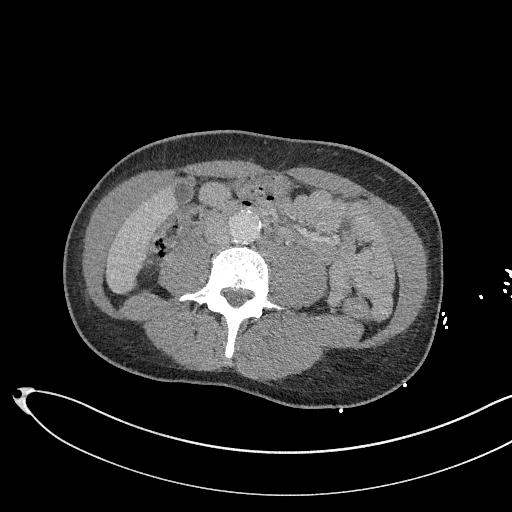
[im 168/236  soft-tissue]
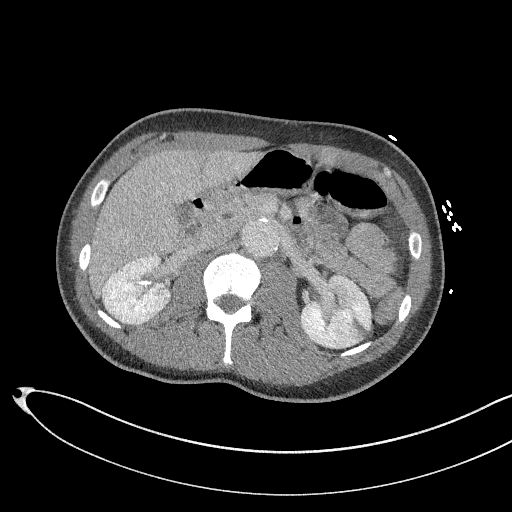
[im 168/236  lung]
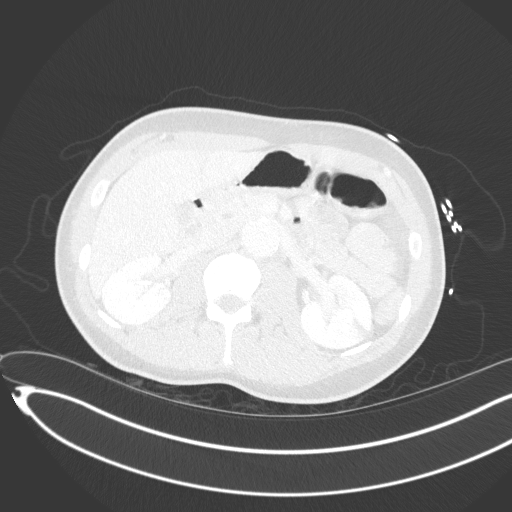
[im 185/236  soft-tissue]
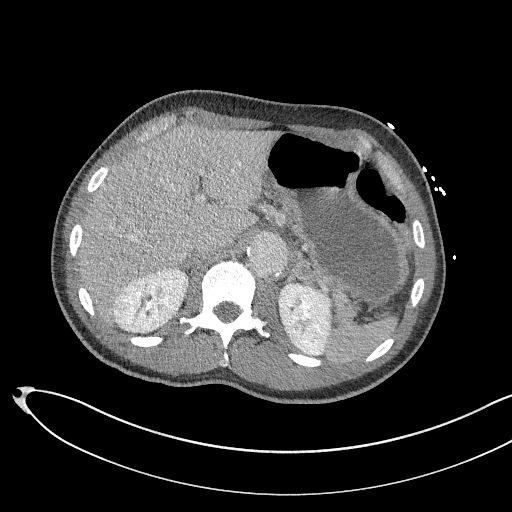
[im 185/236  lung]
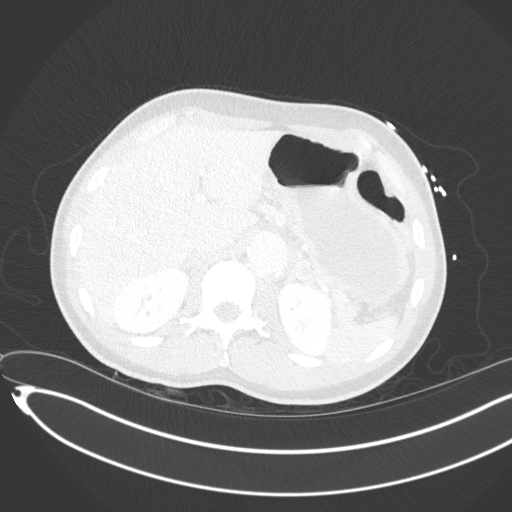
[im 202/236  lung]
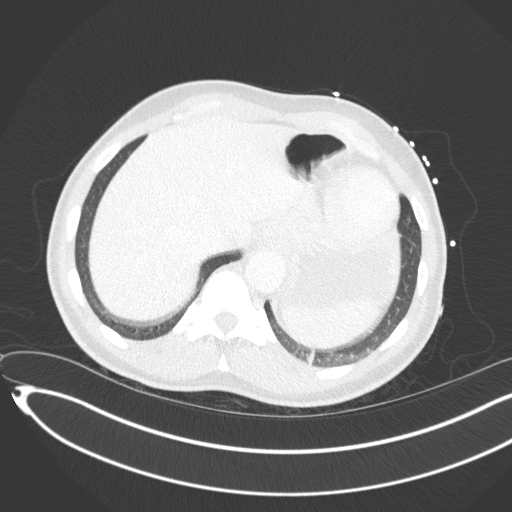
[im 219/236  soft-tissue]
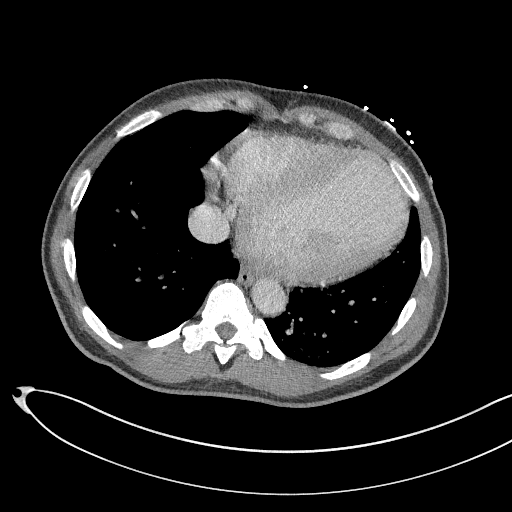
[im 219/236  lung]
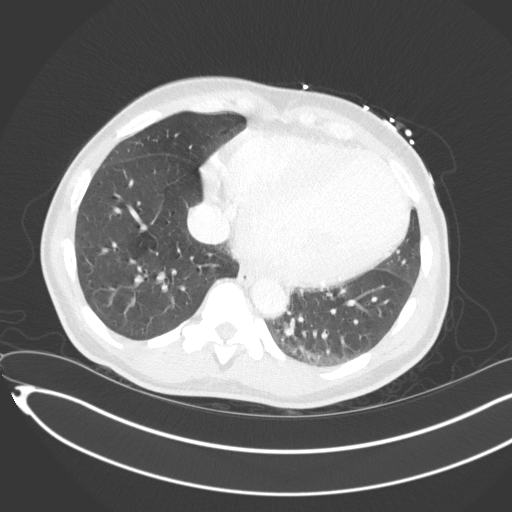
[im 219/236  bone]
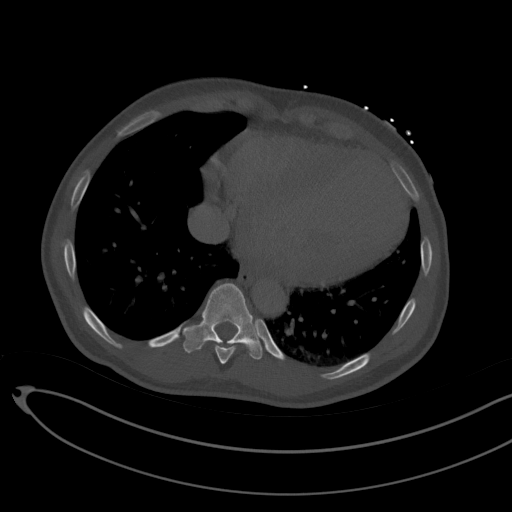

[Series 8: coronal mpr · coronal · 0.74mm/px · 2 of 128 slices shown]
[im 43/128  soft-tissue]
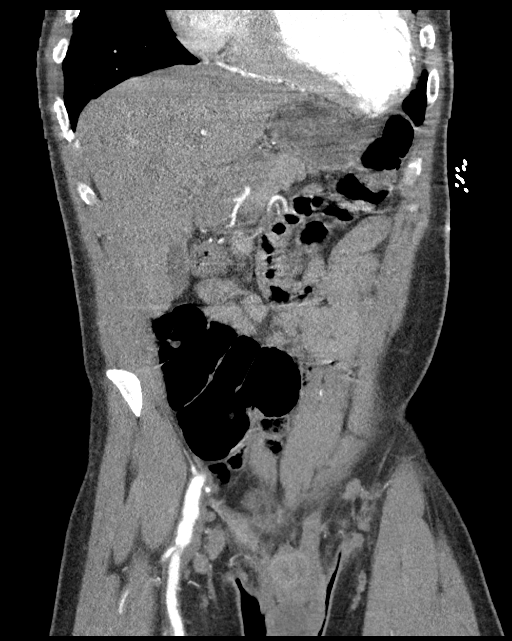
[im 85/128  soft-tissue]
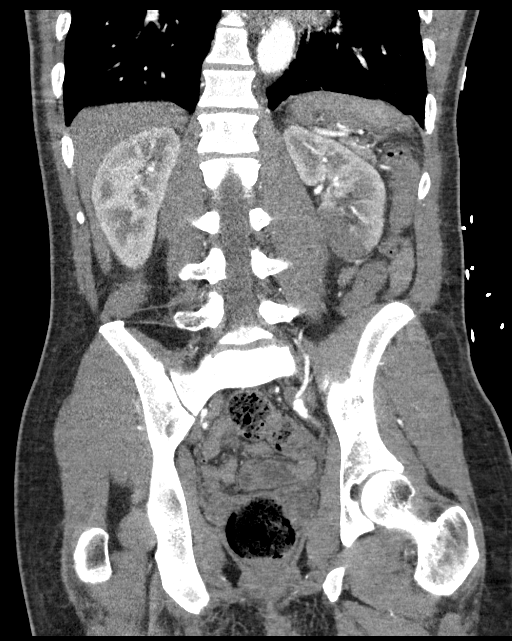

[12 of 46 positions shown; findings below may reference images not displayed]

FINDINGS: VASCULAR

Aorta: Maximal diameter of the aorta is at the level of the renal
artery takeoff measuring 3.2 cm. Mild diffuse atherosclerotic
calcification. Aorta is patent within the lumen.

Celiac: Patent.  Branch vessels are grossly patent.

SMA: Patent.

Renals: Single renal arteries are patent

IMA: IMA is very diminutive but grossly patent.

Inflow: Maximal right common iliac artery diameter is 2.3 cm.
Internal and external iliac arteries are patent. Maximal right
external iliac artery diameter is 1.1 cm. The common iliac artery is
patent. Left common iliac artery maximal diameter is 1.5 cm. It is
patent. Left external and internal iliac arteries are mildly ectatic
and patent.

Proximal Outflow: Grossly patent.

Veins: Portal, splenic, superior mesenteric, hepatic, and renal
veins are patent.

Review of the MIP images confirms the above findings.

NON-VASCULAR

Lower chest: Dependent atelectasis

Hepatobiliary: Unremarkable liver and gallbladder

Pancreas: Unremarkable

Spleen: Unremarkable

Adrenals/Urinary Tract: Adrenal glands and right kidney are
unremarkable. There are wedge-shaped low-density areas throughout
the left kidney. This occurs primarily in the mid left kidney, and
lower pole. The largest wedge-shaped area is in the lower pole
measuring 5.1 x 3.3 cm. The appearance is most consistent with
multiple infarcts. Unremarkable bladder.

Stomach/Bowel: No obvious mass in the colon. Lack of oral contrast
limits evaluation of the bowel. No obvious evidence of small-bowel
obstruction.

Lymphatic: No evidence of para-aortic adenopathy. No obvious iliac
adenopathy. Bilateral inguinal adenopathy has not subjectively
improved.

Reproductive: Associated inflammatory changes in the perineum have
improved. The perineum and scrotum are only partially imaged on this
study. Prostate is within normal limits.

Other: No free-fluid.

Musculoskeletal: No vertebral compression deformity.
IMPRESSION: VASCULAR

Maximal aortic diameter is 3.2 cm. Recommend followup by ultrasound
in 3 years. This recommendation follows ACR consensus guidelines:
White Paper of the ACR Incidental Findings Committee II on Vascular
Findings. [HOSPITAL] 2317; [DATE].

Multiple wedge-shaped low-density defects within the left kidney
most consistent with infarcts. This can be seen with vasculitis and
embolic phenomenon. This is favored over an inflammatory process or
infiltration by tumor.

Right common iliac artery aneurysm measuring 2.3 cm. Remainder of
the iliac vasculature is ectatic.

NON-VASCULAR

Inflammatory changes in the perineum are improved. Inguinal
adenopathy is stable.

## 2017-09-27 ENCOUNTER — Other Ambulatory Visit: Payer: Self-pay | Admitting: Cardiovascular Disease

## 2017-09-27 NOTE — Telephone Encounter (Signed)
Please review for refill, Thanks !  

## 2018-01-10 NOTE — Progress Notes (Deleted)
NO SHOW

## 2018-01-11 ENCOUNTER — Ambulatory Visit: Payer: Medicaid Other | Admitting: Cardiovascular Disease

## 2018-01-12 ENCOUNTER — Encounter: Payer: Self-pay | Admitting: Cardiovascular Disease

## 2018-02-03 ENCOUNTER — Telehealth: Payer: Self-pay | Admitting: Cardiovascular Disease

## 2018-02-03 NOTE — Telephone Encounter (Signed)
-----   Message from Kendrick Fries, [place] sent at 02/02/2018  9:58 AM EST ----- Pt hasn't seen TG since 05/2016. Pt is OV for 6 month f/u with TG pt is coming in 02/13/2017. Pt went to [facility] for Cardiac Cath/Procedure 03/18/2017. I'm not sure if pt wants to keep appointment or if he will be following up with [facility] for his cardiac needs.   Thank you, Jearld Adjutant

## 2018-02-03 NOTE — Telephone Encounter (Signed)
Attempted to call patient and clarify appointments No answer, No VM Will try again later

## 2018-02-06 ENCOUNTER — Ambulatory Visit (INDEPENDENT_AMBULATORY_CARE_PROVIDER_SITE_OTHER): Payer: Medicaid Other

## 2018-02-06 DIAGNOSIS — I513 Intracardiac thrombosis, not elsewhere classified: Secondary | ICD-10-CM

## 2018-02-06 DIAGNOSIS — Z5181 Encounter for therapeutic drug level monitoring: Secondary | ICD-10-CM

## 2018-02-06 LAB — POCT INR: INR: 1.1 — AB (ref 2.0–3.0)

## 2018-02-06 MED ORDER — WARFARIN SODIUM 5 MG PO TABS
ORAL_TABLET | ORAL | 0 refills | Status: DC
Start: 2018-02-06 — End: 2018-03-16

## 2018-02-06 NOTE — Patient Instructions (Signed)
Please resume dosage of 1 tablet daily except 2 tablets on Mondays, Wednesdays and Fridays. Recheck INR in 1 week when you see Dr. Mariah Milling.

## 2018-02-12 NOTE — Progress Notes (Deleted)
NO SHOW

## 2018-02-13 ENCOUNTER — Ambulatory Visit: Payer: Medicaid Other | Admitting: Cardiovascular Disease

## 2018-02-13 NOTE — Telephone Encounter (Signed)
Pt was seen for INR check on 02/06/18 after not being seen in our coumadin clinic in over a year.  His INR at that time was 1.1. Advised pt that he has not been seen by Dr. Mariah Milling since 05/2016 and that I cannot see him unless he is seen by a provider in our office at least once a year.  Pt reports that he has been followed by [facility] cardiology and they have been refilling his coumadin & monitoring his INR, but they don't monitor it as closely as our office, so he would like to resume INR checks in our office. Pt scheduled an appt w/ Dr. Mariah Milling for today (02/13/18) and was sched for 1 week INR check at the same time. Pt No-Showed for both appts today.  Pt No-Showed for every INR check w/ in the past year - his last kept appt w/ me was 02/14/17 & then on 02/06/18. I sent in warfarin refill on 02/06/18 of 5 mg #30 w/ 0 refills, which would be a 3 week supply at his current dose.  I attempted to call his number, but there was no answer or vm. Since he has not been seen by a provider in the past year, how should I proceed if/when he calls/comes in for INR check?

## 2018-02-14 ENCOUNTER — Encounter: Payer: Self-pay | Admitting: Cardiovascular Disease

## 2018-02-15 NOTE — Telephone Encounter (Signed)
He is followed at [facility] We should d/c

## 2018-03-14 NOTE — Progress Notes (Deleted)
Cardiology Office Note Date:  03/14/2018  Patient ID:  Joshua Tate, Joshua Tate 05/07/1975, MRN [id number] PCP:  [facility], [facility]  Cardiologist:  Lucienne Minksrefresh   Chief Complaint: ***  History of Present Illness: Joshua Tate is a 43 y.o. 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  Review of records indicate prior to 2017 the patient had no known coronary disease or heart failure.  He was undergoing surgery for hidradenitis supprativa in 11/2015 and developed ST segment changes during his procedure.  Following this, he underwent echo which revealed an EF of 20 to 25% with suggestion of LV thrombus.  Remaining echocardiogram findings showed akinesis of the mid apical anterior and anterior lateral myocardium, dyskinesis of the apical myocardium, mild aortic insufficiency, mild to moderate mitral regurgitation, moderately dilated left atrium, mildly dilated right atrium.  He was also found to have a renal infarction which was thought to be secondary to the LV thrombus and was started on anticoagulation as well as evidence-based heart failure therapy.  He underwent LHC at that time which revealed 100% distal LAD occlusion as well as fully occluded diagonal branch and disease in his OM.  He was started on medical therapy for this and did not receive PCI.  It appears per records, he has been noncompliant with medications as the patient has been particularly concerned about medication impact on his erectile dysfunction.  Most recent cardiac cath at [facility] from 03/2017 showed 100% mid LAD occlusion, moderately calcified stenoses of the  RCA and LCx.  RHC showed an RA mean of 3, RV 30/4, PCWP mean 13, PA sat 67%, Fick CO 5.6, Fick CI 2.6.  Most recent echo from [facility] in 04/2017 showed an EF of 10 to 15%.  He was last seen by [facility] and 05/2017 with a weight of 193 pounds and felt to be euvolemic.  He was continued on his current medication regimen including Coreg and losartan.  He was instructed to pick up his previously prescribed spironolactone.  ***   Past Medical History:  Diagnosis Date  . Abscess    to rectum  . Hypertension     Past Surgical History:  Procedure Laterality Date  . CARDIAC CATHETERIZATION Left 11/17/2015   Procedure: Left Heart Cath and Coronary Angiography;  Surgeon: Iran Ouch, MD;  Location: [facility] INVASIVE CV LAB;  Service: Cardiovascular;  Laterality: Left;  . EYE SURGERY    . INCISION AND DRAINAGE ABSCESS N/A 09/26/2015   Procedure: INCISION AND DRAINAGE perirectal  ABSCESS;  Surgeon: Leafy Ro, MD;  Location: [facility] ORS;  Service: General;  Laterality: N/A;  . INCISION AND DRAINAGE ABSCESS N/A 11/10/2015   Procedure: INCISION AND DRAINAGE ABSCESS;  Surgeon: Vanna Scotland, MD;  Location: [facility] ORS;  Service: Urology;  Laterality: N/A;    No outpatient medications have been marked as taking for the 03/16/18 encounter (Appointment) with Sondra Barges, PA-C.    Allergies:   Aspirin and Latex   Social History:  The patient  reports that he has been smoking cigars. He has a 40.00 pack-year smoking history. He has never used smokeless tobacco. He reports current alcohol use. He reports current drug use. Frequency: 7.00 times per  week. Drug: Marijuana.   Family History:  The patient's family history includes CAD in his mother; Diabetes Mellitus II in his mother; Hypertension in his mother; Kidney disease in his mother; Lung cancer in his maternal grandmother; Prostate cancer in his paternal grandfather.  ROS:   ROS   PHYSICAL EXAM: *** VS:  There were no vitals taken for this visit. BMI: There is  no height or weight on file to calculate BMI.  Physical Exam   EKG:  Was ordered and interpreted by me today. Shows ***  Recent Labs: No results found for requested labs within last 8760 hours.  No results found for requested labs within last 8760 hours.   CrCl cannot be calculated (Patient's most recent lab result is older than the maximum 21 days allowed.).   Wt Readings from Last 3 Encounters:  05/27/16 183 lb 8 oz (83.2 kg)  03/03/16 181 lb 11.2 oz (82.4 kg)  02/26/16 180 lb 8 oz (81.9 kg)     Other studies reviewed: Additional studies/records reviewed today include: summarized above  ASSESSMENT AND PLAN:  1. ***  Disposition: F/u with *** in   Current medicines are reviewed at length with the patient today.  The patient did not have any concerns regarding medicines.  Elinor Dodge PA-C 03/14/2018 7:17 AM     [facility] [address] [place], [place] [address] [phone]

## 2018-03-16 ENCOUNTER — Other Ambulatory Visit: Payer: Self-pay

## 2018-03-16 ENCOUNTER — Ambulatory Visit: Payer: Medicaid Other | Admitting: Physician Assistant

## 2018-03-16 MED ORDER — WARFARIN SODIUM 5 MG PO TABS
ORAL_TABLET | ORAL | 0 refills | Status: DC
Start: 2018-03-16 — End: 2018-03-20

## 2018-03-16 NOTE — Telephone Encounter (Signed)
Spoke with pt. He made appt for next Wednesday. He has been without coumadin for >1 week. Advised how dangerous this could be and that will send for enough to get him to appt, but that he needs to make appt for additional fills. He states understanding.

## 2018-03-20 ENCOUNTER — Other Ambulatory Visit: Payer: Self-pay

## 2018-03-20 MED ORDER — WARFARIN SODIUM 5 MG PO TABS: ORAL_TABLET | ORAL | 0 refills | Status: DC

## 2018-03-22 ENCOUNTER — Encounter: Payer: Self-pay | Admitting: Cardiovascular Disease

## 2018-03-22 ENCOUNTER — Ambulatory Visit (INDEPENDENT_AMBULATORY_CARE_PROVIDER_SITE_OTHER): Payer: Medicaid Other

## 2018-03-22 ENCOUNTER — Ambulatory Visit (INDEPENDENT_AMBULATORY_CARE_PROVIDER_SITE_OTHER): Payer: Medicaid Other | Admitting: Cardiovascular Disease

## 2018-03-22 VITALS — BP 142/82 | HR 69 | Ht 72.0 in | Wt 202.0 lb

## 2018-03-22 DIAGNOSIS — E1159 Type 2 diabetes mellitus with other circulatory complications: Secondary | ICD-10-CM

## 2018-03-22 DIAGNOSIS — Z5181 Encounter for therapeutic drug level monitoring: Secondary | ICD-10-CM

## 2018-03-22 DIAGNOSIS — I513 Intracardiac thrombosis, not elsewhere classified: Secondary | ICD-10-CM | POA: Diagnosis not present

## 2018-03-22 DIAGNOSIS — I255 Ischemic cardiomyopathy: Secondary | ICD-10-CM

## 2018-03-22 DIAGNOSIS — I10 Essential (primary) hypertension: Secondary | ICD-10-CM | POA: Diagnosis not present

## 2018-03-22 LAB — POCT INR: INR: 1.2 — AB (ref 2.0–3.0)

## 2018-03-22 MED ORDER — WARFARIN SODIUM 5 MG PO TABS: ORAL_TABLET | ORAL | 0 refills | Status: DC

## 2018-03-22 NOTE — Patient Instructions (Signed)
Please take 3 tablets today, and tomorrow, then resume dosage of 1 tablet daily except 2 tablets on Mondays, Wednesdays and Fridays. Recheck INR in 2.5 weeks.

## 2018-03-22 NOTE — Progress Notes (Signed)
Cardiology Office Note  Date:  03/22/2018   ID:  Joshua Tate, DOB 08-16-1975, MRN [id number]  PCP:  [facility]   Chief Complaint  Patient presents with  . other    F/U Denies CP SOB Dizziness. Medications reviewed verbally with patient.    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 HTN,  DM  [facility] ED 11/10/2015 for evaluation of drainage from his scrotum and perineum, post-operative course  noted EKG changes along the anterolateral leads, Echocardiogram ejection fraction 20% with regions of wall motion abnormality, Medication noncompliance He presents today for follow-up of his ischemic cardiomyopathy He has 6 children some are grown, one in first grade  Last time he was seen in our clinic in April 2018 Noncompliance with his warfarin and our Coumadin clinic  Reports he [facility] Last visit April 2019 At that time it was recommended he start spironolactone but he never started this There was discussion of ICD but he is not interested  Denies having any leg edema, abdominal bloating, PND orthopnea Reports compliance with some of his medications, does not know how many refills, did not bring his medications with him  [facility] from [facility] detailing Coreg 25 twice daily And notes have 12.5 twice daily, he does not know Reports he had some side effects on some of the medications and does not know if they were changed based on his symptoms  Also comes in today for warfarin check Out of refills, has not been seen in quite some time Has been off warfarin for 10 days took 1 dose yesterday INR 1.2 today   [facility] with him in detail Echo 05/01/2017  Dilated left ventricle - severe  Left ventricular hypertrophy - mild to moderate  Severely decreased left ventricular systolic function, ejection fraction  10 to  15%  Normal right ventricular systolic function  Dilated left atrium  EKG personally reviewed by myself on todays visit Shows normal sinus rhythm rate 69 bpm LVH, T wave abnormality V5, V6, 1 and aVL, 2, aVF  Other records reviewed with him  cardiac catheterization 11/17/2015, dr. 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  Mid LAD to Dist LAD lesion, 30 %stenosed.  Dist Cx lesion, 40 %stenosed.  Ost 2nd Diag to 2nd Diag lesion, 100 %stenosed.  Ost 1st Mrg to 1st Mrg lesion, 70 %stenosed.  LV end diastolic pressure is moderately elevated.  Medical management recommended for occluded distal LAD with right-to-left collaterals from the right coronary artery. Also occluded second diagonal with faint collaterals. Borderline disease in OM1.    Other past medical history reviewed Previoushospital admission for kidney embolism, presumably from mural thrombus Started on Lovenox, transition to warfarin    PMH:   has a past medical history of Abscess and Hypertension.  PSH:    Past Surgical History:  Procedure Laterality Date  . CARDIAC CATHETERIZATION Left 11/17/2015   Procedure: Left Heart Cath and Coronary Angiography;  Surgeon: Iran OuchMuhammad A Arida, MD;  Location: [facility] INVASIVE CV LAB;  Service: Cardiovascular;  Laterality: Left;  . EYE SURGERY    . INCISION AND DRAINAGE ABSCESS N/A 09/26/2015   Procedure: INCISION AND DRAINAGE perirectal  ABSCESS;  Surgeon: Leafy Roiego F Pabon, MD;  Location: [facility] ORS;  Service: General;  Laterality: N/A;  . INCISION AND DRAINAGE ABSCESS N/A 11/10/2015   Procedure: INCISION AND DRAINAGE ABSCESS;  Surgeon: Vanna ScotlandAshley Brandon, MD;  Location: [facility] ORS;  Service: Urology;  Laterality: N/A;    Current Outpatient Medications  Medication Sig Dispense Refill  . atorvastatin (LIPITOR) 40 MG tablet TAKE ONE TABLET BY MOUTH ONCE DAILY AT  6PM 30 tablet 6  . carvedilol (COREG) 12.5 MG tablet Take 1 tablet (12.5 mg total) by mouth 2 (two) times daily with a meal. 60  tablet 6  . clindamycin (CLINDAGEL) 1 % gel Apply topically 2 (two) times daily. 30 g 3  . furosemide (LASIX) 20 MG tablet Take 20 mg by mouth 2 (two) times daily.  11  . losartan (COZAAR) 100 MG tablet Take 1 tablet (100 mg total) by mouth daily. 30 tablet 6  . warfarin (COUMADIN) 5 MG tablet Take as directed by the coumadin clinic.DO NOT REFILL - PT HAS NOT BEEN SEEN BY MD IN OUR OFFICE IN 2 YRS - SEES CARDS @ [facility] 15 tablet 0  . isosorbide mononitrate (IMDUR) 30 MG 24 hr tablet Take 1 tablet (30 mg total) by mouth daily. (Patient not taking: Reported on 05/27/2016) 30 tablet 11   No current facility-administered medications for this visit.      Allergies:   Aspirin and Latex   Social History:  The patient  reports that he has been smoking cigars. He has a 40.00 pack-year smoking history. He has never used smokeless tobacco. He reports current alcohol use. He reports current drug use. Frequency: 7.00 times per week. Drug: Marijuana.   Family History:   family history includes CAD in his mother; Diabetes Mellitus II in his mother; Hypertension in his mother; Kidney disease in his mother; Lung cancer in his maternal grandmother; Prostate cancer in his paternal grandfather.    Review of Systems: Review of Systems  Constitutional: Negative.   Respiratory: Negative.   Cardiovascular: Negative.   Gastrointestinal: Negative.   Genitourinary: Negative.   Musculoskeletal: Negative.   Neurological: Negative.   Psychiatric/Behavioral: Negative.   All other systems reviewed and are negative.    PHYSICAL EXAM: VS:  BP (!) 142/82 (BP Location: Left Arm, Patient Position: Sitting, Cuff Size: Normal)   Pulse 69   Ht 6' (1.829 m)   Wt 202 lb (91.6 kg)   BMI 27.40 kg/m  , BMI Body mass index is 27.4 kg/m. GEN: Well nourished, well developed, in no acute distress  HEENT: normal  Neck: no JVD, carotid bruits, or masses Cardiac: RRR; no murmurs, rubs, or gallops,no edema  Respiratory:  clear  to auscultation bilaterally, normal work of breathing GI: soft, nontender, nondistended, + BS MS: no deformity or atrophy  Skin: warm and dry, no rash, old track marks on his arms bilaterally  Neuro:  Strength and sensation are intact Psych: euthymic mood, full affect   Recent Labs: No results found for requested labs within last 8760 hours.    Lipid Panel Lab Results  Component Value Date   CHOL 104 11/11/2015   HDL 42 11/11/2015   LDLCALC 54 11/11/2015   TRIG 38 11/11/2015      Wt Readings from Last 3 Encounters:  03/22/18 202 lb (91.6 kg)  05/27/16 183 lb 8 oz (83.2 kg)  03/03/16 181 lb 11.2 oz (82.4 kg)      ASSESSMENT AND PLAN:  Essential hypertension  continue carvedilol, isosorbide, losartan Medication noncompliance especially with his warfarin Unclear if he is getting his refills from [facility], call [facility] Last seen [facility] April 2019 Did not take his spironolactone does not want ICD  Ischemic cardiomyopathy Recommended smoking cessation, still  smoking cigars stay on his Lipitor, compliance unclear Not a good candidate to start spironolactone given he will not follow-up for lab work No medication changes made   Mural thrombus of cardiac apex  stressed importance of compliance with his warfarin  He is high risk of recurrent mural thrombus, stroke  Missed many appointments and noncompliant with his warfarin  Renal infarct (HCC) INR subtherapeutic Will restart today but strongly recommended he follow-up in clinic for warfarin refill  Perirectal abscess Reports this is not a active issue  Type 2 diabetes mellitus with other circulatory complication, without long-term current use of insulin (HCC) Recommend close follow-up with primary care  Encounter for anticoagulation discussion and counseling As above recommended compliance with following up with anticoagulation clinic appointments    Total encounter time more than 25 minutes  Greater than 50% was spent  in counseling and coordination of care with the patient   Disposition:   F/U  6 months  No orders of the defined types were placed in this encounter.    Signed, Dossie Arbour, M.D., Ph.D. 03/22/2018  [facility] [place], [place] [phone]

## 2018-03-22 NOTE — Patient Instructions (Signed)

## 2018-03-23 NOTE — Addendum Note (Signed)
Addended by: Aurelio Jew on: 03/23/2018 08:19 AM   Modules accepted: Orders

## 2018-03-31 ENCOUNTER — Other Ambulatory Visit: Payer: Self-pay | Admitting: Cardiovascular Disease

## 2018-03-31 NOTE — Telephone Encounter (Signed)
Refill Request.  

## 2018-04-10 ENCOUNTER — Ambulatory Visit (INDEPENDENT_AMBULATORY_CARE_PROVIDER_SITE_OTHER): Payer: Medicaid Other

## 2018-04-10 DIAGNOSIS — Z5181 Encounter for therapeutic drug level monitoring: Secondary | ICD-10-CM | POA: Diagnosis not present

## 2018-04-10 DIAGNOSIS — I513 Intracardiac thrombosis, not elsewhere classified: Secondary | ICD-10-CM

## 2018-04-10 LAB — POCT INR: INR: 1.6 — AB (ref 2.0–3.0)

## 2018-04-10 NOTE — Patient Instructions (Signed)
Please START NEW DOSAGE of 2 tablets daily except 1 tablets on Mondays, Wednesdays and Fridays. Recheck INR in 3 weeks.

## 2018-04-18 ENCOUNTER — Ambulatory Visit: Payer: Medicaid Other | Admitting: Nurse Practitioner

## 2018-05-02 ENCOUNTER — Other Ambulatory Visit: Payer: Self-pay | Admitting: Cardiovascular Disease

## 2018-05-02 NOTE — Telephone Encounter (Signed)
Please review for refill. Thanks!  

## 2018-08-18 ENCOUNTER — Telehealth: Payer: Self-pay

## 2018-08-18 NOTE — Telephone Encounter (Signed)

## 2018-10-11 ENCOUNTER — Other Ambulatory Visit: Payer: Self-pay | Admitting: Cardiovascular Disease

## 2018-10-11 NOTE — Telephone Encounter (Signed)
If Home Health RN is calling please get Coumadin Nurse on the phone STAT  1.  Are you calling in regards to an appointment? Yes wants asap   2.  Are you calling for a refill ? Yes out x 1 week   3.  Are you having bleeding issues? No   4.  Do you need clearance to hold Coumadin? No    Please route to the Coumadin Clinic Pool

## 2018-10-11 NOTE — Telephone Encounter (Signed)
Pt has no-showed for last 5 appts for INR check, last appt w/ me was 04/10/18. He will need to come in for appt before I can send in any refills. Pt also sees cardiology @ [facility]; perhaps they have been sending in his refills, as he has not received from this office since March.

## 2018-10-23 ENCOUNTER — Ambulatory Visit (INDEPENDENT_AMBULATORY_CARE_PROVIDER_SITE_OTHER): Payer: Medicaid Other

## 2018-10-23 ENCOUNTER — Other Ambulatory Visit: Payer: Self-pay

## 2018-10-23 DIAGNOSIS — I513 Intracardiac thrombosis, not elsewhere classified: Secondary | ICD-10-CM | POA: Diagnosis not present

## 2018-10-23 DIAGNOSIS — Z5181 Encounter for therapeutic drug level monitoring: Secondary | ICD-10-CM | POA: Diagnosis not present

## 2018-10-23 LAB — POCT INR: INR: 1.1 — AB (ref 2.0–3.0)

## 2018-10-23 NOTE — Patient Instructions (Signed)
Please take 2 tablets today, 3 tablets tomorrow, then resume dosage of 2 tablets daily except 1 tablets on Mondays, Wednesdays and Fridays. Recheck INR in 1 week.

## 2018-10-24 ENCOUNTER — Other Ambulatory Visit: Payer: Self-pay | Admitting: Cardiovascular Disease

## 2018-10-24 NOTE — Telephone Encounter (Signed)
Refill Request.  

## 2018-10-30 ENCOUNTER — Ambulatory Visit (INDEPENDENT_AMBULATORY_CARE_PROVIDER_SITE_OTHER): Payer: Medicaid Other

## 2018-10-30 ENCOUNTER — Other Ambulatory Visit: Payer: Self-pay

## 2018-10-30 DIAGNOSIS — Z5181 Encounter for therapeutic drug level monitoring: Secondary | ICD-10-CM

## 2018-10-30 DIAGNOSIS — I513 Intracardiac thrombosis, not elsewhere classified: Secondary | ICD-10-CM | POA: Diagnosis not present

## 2018-10-30 LAB — POCT INR: INR: 1.5 — AB (ref 2.0–3.0)

## 2018-10-30 NOTE — Patient Instructions (Signed)
Please take another tablet when you get home today (for a total of 3 tablets today), then START NEW DOSAGE of 2 tablets every day. Recheck INR in 2 weeks.

## 2018-12-13 ENCOUNTER — Other Ambulatory Visit: Payer: Self-pay | Admitting: Cardiovascular Disease

## 2018-12-13 MED ORDER — WARFARIN SODIUM 5 MG PO TABS: ORAL_TABLET | ORAL | 0 refills | Status: DC

## 2018-12-13 NOTE — Telephone Encounter (Signed)
°*  STAT* If patient is at the pharmacy, call can be transferred to refill team.   1. Which medications need to be refilled? (please list name of each medication and dose if known) Warfarin (COUMADIN) 5 MG   2. Which pharmacy/location (including street and city if local pharmacy) is medication to be sent to? [place]  3. Do they need a 30 day or 90 day supply? 90 day

## 2018-12-13 NOTE — Telephone Encounter (Signed)
Pt frequently No-shows for INR checks. Last INR check 10/30/18, pt did not show for that appt and most recently 12/11/18. Have discussed compliance w/ pt's appts on multiple occasions (he also sees [facility] cardiology). I am giving him a 1 week supply of warfarin to get him to next scheduled appt on 12/20/18.

## 2018-12-20 ENCOUNTER — Other Ambulatory Visit: Payer: Self-pay

## 2018-12-20 ENCOUNTER — Ambulatory Visit (INDEPENDENT_AMBULATORY_CARE_PROVIDER_SITE_OTHER): Payer: Medicaid Other

## 2018-12-20 DIAGNOSIS — Z5181 Encounter for therapeutic drug level monitoring: Secondary | ICD-10-CM

## 2018-12-20 DIAGNOSIS — I513 Intracardiac thrombosis, not elsewhere classified: Secondary | ICD-10-CM | POA: Diagnosis not present

## 2018-12-20 LAB — POCT INR: INR: 1.1 — AB (ref 2.0–3.0)

## 2018-12-20 MED ORDER — WARFARIN SODIUM 5 MG PO TABS: ORAL_TABLET | ORAL | 0 refills | Status: DC

## 2018-12-20 NOTE — Patient Instructions (Signed)
Please take 3 tablets today and tomorrow, resume dosage of 2 tablets every day. Recheck INR in 2 weeks.

## 2019-01-01 ENCOUNTER — Other Ambulatory Visit: Payer: Self-pay

## 2019-01-01 ENCOUNTER — Ambulatory Visit (INDEPENDENT_AMBULATORY_CARE_PROVIDER_SITE_OTHER): Payer: Medicaid Other

## 2019-01-01 DIAGNOSIS — I513 Intracardiac thrombosis, not elsewhere classified: Secondary | ICD-10-CM | POA: Diagnosis not present

## 2019-01-01 DIAGNOSIS — Z5181 Encounter for therapeutic drug level monitoring: Secondary | ICD-10-CM | POA: Diagnosis not present

## 2019-01-01 LAB — POCT INR: INR: 2.5 (ref 2.0–3.0)

## 2019-01-01 NOTE — Patient Instructions (Signed)
Please continue dosage of 2 tablets every day. Recheck INR in 3 weeks.

## 2019-02-12 ENCOUNTER — Other Ambulatory Visit: Payer: Self-pay

## 2019-02-12 ENCOUNTER — Ambulatory Visit (INDEPENDENT_AMBULATORY_CARE_PROVIDER_SITE_OTHER): Payer: Medicaid Other

## 2019-02-12 DIAGNOSIS — I513 Intracardiac thrombosis, not elsewhere classified: Secondary | ICD-10-CM

## 2019-02-12 DIAGNOSIS — Z5181 Encounter for therapeutic drug level monitoring: Secondary | ICD-10-CM

## 2019-02-12 DIAGNOSIS — H35039 Hypertensive retinopathy, unspecified eye: Secondary | ICD-10-CM | POA: Diagnosis not present

## 2019-02-12 DIAGNOSIS — R9431 Abnormal electrocardiogram [ECG] [EKG]: Secondary | ICD-10-CM

## 2019-02-12 DIAGNOSIS — I255 Ischemic cardiomyopathy: Secondary | ICD-10-CM | POA: Diagnosis not present

## 2019-02-12 LAB — POCT INR: INR: 1.6 — AB (ref 2.0–3.0)

## 2019-02-12 MED ORDER — WARFARIN SODIUM 5 MG PO TABS: ORAL_TABLET | ORAL | 0 refills | Status: DC

## 2019-02-12 NOTE — Patient Instructions (Signed)
Please take 3 tablets tonight & tomorrow, then continue dosage of 2 tablets every day. Recheck INR in 2 weeks.

## 2019-02-26 ENCOUNTER — Ambulatory Visit (INDEPENDENT_AMBULATORY_CARE_PROVIDER_SITE_OTHER): Payer: Medicaid Other

## 2019-02-26 ENCOUNTER — Other Ambulatory Visit: Payer: Self-pay

## 2019-02-26 DIAGNOSIS — Z5181 Encounter for therapeutic drug level monitoring: Secondary | ICD-10-CM

## 2019-02-26 DIAGNOSIS — I513 Intracardiac thrombosis, not elsewhere classified: Secondary | ICD-10-CM

## 2019-02-26 LAB — POCT INR: INR: 2.3 (ref 2.0–3.0)

## 2019-02-26 MED ORDER — WARFARIN SODIUM 5 MG PO TABS: ORAL_TABLET | ORAL | 0 refills | Status: DC

## 2019-02-26 NOTE — Progress Notes (Signed)
Pt states that he needs to have his "heart checked out" b/c he is having some new SOBOE.  He reports that he just saw cardiologist @ [facility] ~2 mos ago and Dr. Mariah Milling ~6 mos ago.  Advised pt that he would most likely need to have 1 cardiologist so that one can prescribe meds - he states that he has been told this previously, but he "lets them actually take care of my heart - I only see Dr. Mariah Milling every once in a while so I can get my coumadin checked here".

## 2019-02-26 NOTE — Patient Instructions (Signed)
-   Continue dosage of 2 tablets every day. - Recheck INR in 3 weeks.

## 2019-03-01 ENCOUNTER — Telehealth: Payer: Self-pay | Admitting: *Deleted

## 2019-03-01 NOTE — Telephone Encounter (Signed)
From: Joline Maxcy Sent: 03/01/2019   2:17 PM EST To: Kendrick Fries, CMA  Hey lady patient says he hasnt been able to be seen by [facility] and would like to see [place].  He is aware he should not continue to see both and has decided to come to [facility] for cards.    ----- Message ----- From: Kendrick Fries, CMA Sent: 03/01/2019  11:36 AM EST To: [facility] Scheduling  Pt seems to be following up with [facility] cardiology . I'm not sure if pt is going there only for EP reasons or if he will continue to follow up with Dr.Gollan? Pt seems to be going back and forth with [facility] and [place] and I'm not sure why.  Please advise if pt needs to cancel appointment.

## 2019-03-01 NOTE — Telephone Encounter (Signed)
-----   Message from Joline Maxcy sent at 03/01/2019  2:17 PM EST ----- Hey lady patient says he hasnt been able to be seen by [facility] and would like to see [place].  He is aware he should not continue to see both and has decided to come to [facility] for cards.    ----- Message ----- From: Kendrick Fries, CMA Sent: 03/01/2019  11:36 AM EST To: [facility] Scheduling  Pt seems to be following up with [facility] cardiology . I'm not sure if pt is going there only for EP reasons or if he will continue to follow up with Dr.Gollan? Pt seems to be going back and forth with [facility] and [place] and I'm not sure why.  Please advise if pt needs to cancel appointment.

## 2019-03-02 ENCOUNTER — Encounter: Payer: Self-pay | Admitting: Physician Assistant

## 2019-03-02 ENCOUNTER — Other Ambulatory Visit: Payer: Self-pay

## 2019-03-02 ENCOUNTER — Ambulatory Visit (INDEPENDENT_AMBULATORY_CARE_PROVIDER_SITE_OTHER): Payer: Medicaid Other | Admitting: Physician Assistant

## 2019-03-02 VITALS — BP 150/80 | HR 82 | Ht 72.0 in | Wt 208.0 lb

## 2019-03-02 DIAGNOSIS — L0291 Cutaneous abscess, unspecified: Secondary | ICD-10-CM

## 2019-03-02 DIAGNOSIS — F17200 Nicotine dependence, unspecified, uncomplicated: Secondary | ICD-10-CM

## 2019-03-02 DIAGNOSIS — I25118 Atherosclerotic heart disease of native coronary artery with other forms of angina pectoris: Secondary | ICD-10-CM

## 2019-03-02 DIAGNOSIS — I10 Essential (primary) hypertension: Secondary | ICD-10-CM

## 2019-03-02 DIAGNOSIS — Z5181 Encounter for therapeutic drug level monitoring: Secondary | ICD-10-CM

## 2019-03-02 DIAGNOSIS — N280 Ischemia and infarction of kidney: Secondary | ICD-10-CM

## 2019-03-02 DIAGNOSIS — R0602 Shortness of breath: Secondary | ICD-10-CM

## 2019-03-02 DIAGNOSIS — K219 Gastro-esophageal reflux disease without esophagitis: Secondary | ICD-10-CM

## 2019-03-02 DIAGNOSIS — R0600 Dyspnea, unspecified: Secondary | ICD-10-CM

## 2019-03-02 DIAGNOSIS — R0609 Other forms of dyspnea: Secondary | ICD-10-CM

## 2019-03-02 DIAGNOSIS — I255 Ischemic cardiomyopathy: Secondary | ICD-10-CM

## 2019-03-02 DIAGNOSIS — E785 Hyperlipidemia, unspecified: Secondary | ICD-10-CM

## 2019-03-02 DIAGNOSIS — H35039 Hypertensive retinopathy, unspecified eye: Secondary | ICD-10-CM

## 2019-03-02 DIAGNOSIS — E1159 Type 2 diabetes mellitus with other circulatory complications: Secondary | ICD-10-CM

## 2019-03-02 DIAGNOSIS — H25042 Posterior subcapsular polar age-related cataract, left eye: Secondary | ICD-10-CM

## 2019-03-02 DIAGNOSIS — R55 Syncope and collapse: Secondary | ICD-10-CM

## 2019-03-02 DIAGNOSIS — I513 Intracardiac thrombosis, not elsewhere classified: Secondary | ICD-10-CM | POA: Diagnosis not present

## 2019-03-02 MED ORDER — ISOSORBIDE MONONITRATE ER 30 MG PO TB24: 30.0000 mg | ORAL_TABLET | Freq: Every day | ORAL | 11 refills | Status: DC

## 2019-03-02 NOTE — Progress Notes (Signed)
Office Visit    Patient Name: Joshua Tate Date of Encounter: 03/04/2019  Primary Care Provider:  [facility] [place] Primary Cardiologist:  No primary care provider on file.  Chief Complaint    44 year old male with history of CAD medically managed by cardiac catheterization 16/11/9602, chronic systolic CHF/ICM with prior suspected mural thrombus by echo 11/2015, renal infarct, recurrent perirectal and scrotal abscesses, hypertension, DM 2, and who presents today for follow-up.   Past Medical History    Past Medical History:  Diagnosis Date  . Abscess    to rectum  . Hypertension    Past Surgical History:  Procedure Laterality Date  . CARDIAC CATHETERIZATION Left 11/17/2015   Procedure: Left Heart Cath and Coronary Angiography;  Surgeon: Wellington Hampshire, MD;  Location: [facility];  Service: Cardiovascular;  Laterality: Left;  . EYE SURGERY    . INCISION AND DRAINAGE ABSCESS N/A 09/26/2015   Procedure: INCISION AND DRAINAGE perirectal  ABSCESS;  Surgeon: Jules Husbands, MD;  Location: [facility];  Service: General;  Laterality: N/A;  . INCISION AND DRAINAGE ABSCESS N/A 11/10/2015   Procedure: INCISION AND DRAINAGE ABSCESS;  Surgeon: Hollice Espy, MD;  Location: [facility];  Service: Urology;  Laterality: N/A;    Allergies  Allergies  Allergen Reactions  . Aspirin     Other reaction(s): Other (See Comments) pt states not allergic to asa.  . Latex Rash    History of Present Illness    In 11/2015 he was treated with IV heparin for possible mural thrombus and transition to ASA only per MD.  He was started on heart failure medications but declined inpatient cardiac cath as he wanted to complete this as an outpatient.  He had diagnostic cardiac cath completed with Dr. Fletcher Anon, MD on 11/17/2015 that showed severe one-vessel CAD with an occluded distal LAD and right to left collaterals from the RCA.  There was also an occluded D2 with faint collaterals and borderline  disease of OM1.  Moderately elevated LVEDP.  LV angiography was not diagnostic and it was elected not to do prior injection given suspected LV thrombus.  He saw his primary cardiologist on follow-up and was doing well with some shortness of breath.  He was continued on current medications. It was noted that there was concern for medication compliance. Also noted was that the patient did not wish to be considered for ICD.  Since that time, he reports that he has not been taking his Imdur and with request it be resent to his pharmacy today. He reports Warfarin compliance with most recent INR therapeutic. He notes DOE when running up stairs and occasional SOB at rest when thinking about something stressful. He notes occasional dizziness with locking his knees or with vasovagal etiology, such as with pain 2/2 his ulcers of his LLE. His BP today is elevated with his report that this is 2/2 his pain today 2/2 his infectious ulcers. He notes that his chronic occipital swelling prevents him from lying flat on his back but denies any s/sx consistent with orthopnea. No LEE, PND, or early satiety. He does note occasional racing HR and palpitations with EKG today significant for ectopy and extrasystole appreciated on today's exam. No LOC or syncope. He is uncertain if started on his statin.  He does not check his BP or weights at home but will do so if he is able to get a BP cuff. He does not have a regular exercise program. We discussed  increasing exercise as tolerated and home BP checks and weights. He is currently smoking 3 black and mild per day with no desire to quit smoking today. He occasionally drinks; however, he stated that he intends to stop drinking as he realizes that this does not agree with him. He denies any recent s/sx consistent with bleeding on anticoagulation. Labs today as below were deferred 2/2 need to get home to his kids.    Home Medications    Prior to Admission medications   Medication Sig  Start Date End Date Taking? Authorizing Provider  atorvastatin (LIPITOR) 40 MG tablet TAKE ONE TABLET BY MOUTH ONCE DAILY AT  6PM 05/27/16   Antonieta Iba, MD  carvedilol (COREG) 12.5 MG tablet Take 1 tablet (12.5 mg total) by mouth 2 (two) times daily with a meal. 05/27/16 03/22/18  Gollan, Tollie Pizza, MD  clindamycin (CLINDAGEL) 1 % gel Apply topically 2 (two) times daily. 03/03/16   Michiel Cowboy A, PA-C  furosemide (LASIX) 20 MG tablet Take 20 mg by mouth 2 (two) times daily. 12/30/17   [provider]  isosorbide mononitrate (IMDUR) 30 MG 24 hr tablet Take 1 tablet (30 mg total) by mouth daily. Patient not taking: Reported on 05/27/2016 02/26/16 02/25/17  Antonieta Iba, MD  losartan (COZAAR) 100 MG tablet Take 1 tablet (100 mg total) by mouth daily. 05/27/16 03/22/18  Antonieta Iba, MD  warfarin (COUMADIN) 5 MG tablet Take as directed by the anti-coag clinic. 02/26/19   Antonieta Iba, MD    Review of Systems    He denies chest pain, pnd, orthopnea, n, v, syncope, edema, weight gain, or early satiety. He reports occasional DOE, mainly if running up his stairs. He reports SOB with stressful thoughts but otherwise no SOB. He notes dizziness with locking his knees or with vasovagal etiology such as 2/2 pain. He feels his heart racing and palpitations at time.  All other systems reviewed and are otherwise negative except as noted above.  Physical Exam    VS:  BP (!) 150/80 (BP Location: Left Arm, Patient Position: Sitting, Cuff Size: Normal)   Pulse 82   Ht 6' (1.829 m)   Wt 208 lb (94.3 kg)   SpO2 98%   BMI 28.21 kg/m  , BMI Body mass index is 28.21 kg/m. GEN: Well nourished, well developed, in no acute distress. HEENT: normal. Occipital bump reported as a stable finding and as noted in his PMH. Neck: Supple, no JVD, carotid bruits, or masses. Cardiac: RRR with extrasystole appreciated, no murmurs, rubs, or gallops. No clubbing, cyanosis, edema.  Radials/DP/PT 2+ and  equal bilaterally.  Respiratory:  Respirations regular and unlabored, coarse breath sounds bilaterally. GI: Soft, nontender, nondistended, BS + x 4. MS: no deformity or atrophy. LLE ulcers. Skin: warm and dry, no rash. Neuro:  Strength and sensation are intact. Psych: Normal affect.  Accessory Clinical Findings    ECG personally reviewed by me today - SR with PVCs and LVH with repolarization abnormalities, 82bpm, prolonged QRS as noted on previous EKG and borderline LBBB but not new when compared with previous EKG. New TWI noted on EKG in inferior leads .  Filed Weights   03/02/19 1513  Weight: 208 lb (94.3 kg)      Cath  11/17/2015  Dist LAD lesion, 100 %stenosed.  Mid LAD to Dist LAD lesion, 30 %stenosed.  Dist Cx lesion, 40 %stenosed.  Ost 2nd Diag to 2nd Diag lesion, 100 %stenosed.  Ost 1st Mrg to  1st Mrg lesion, 70 %stenosed.  LV end diastolic pressure is moderately elevated. 1. Severe one-vessel coronary artery disease with occluded distal LAD with right-to-left collaterals from the right coronary artery. Also occluded second diagonal with faint collaterals. Borderline disease in OM1. 2. Moderately elevated left ventricular end-diastolic pressure. Left ventricular angiography was not diagnostic and I elected not to do power injection given suspected LV thrombus. Recommendations: Recommend medical therapy for coronary artery disease and ischemic cardiomyopathy. I increased the dose of carvedilol to 6.25 mg twice daily. The patient needs a small dose diuretic.  Echo  11/10/2015 - Left ventricle: The cavity size was moderately dilated. There was  mild concentric hypertrophy. Systolic function was severely  reduced. The estimated ejection fraction was in the range of 20%  to 25%. Akinesis of the mid-apicalanterior and anterolateral  myocardium. Dyskinesis of the apical myocardium. Features are  consistent with a pseudonormal left ventricular filling pattern,    with concomitant abnormal relaxation and increased filling  pressure (grade 2 diastolic dysfunction). There was severe  spontaneous echo contrast, indicative of stasis. Cannot exclude  apical thrombus.  - Aortic valve: There was mild regurgitation.  - Mitral valve: There was mild to moderate regurgitation.  - Left atrium: The atrium was moderately dilated.  - Right atrium: The atrium was mildly dilated.   Assessment & Plan    HFrEF (EF 20-25%), ICM CAD with severe 1v CAD --No current CP. DOE with running up stairs. Euvolemic on exam. Reviewed his EKG, poorly controlled pressure with LVH, and ectopy with PVCs on EKG. 2017 cath showed severe 1v CAD and moderately elevated LVEDP. Power injection deferred given suspected LV thrombus. Recommendation was for continued medical therapy. 2017 echo showed LAE, mild LVH, EF 20-25%, RWMA as above, G2DD, possible apical thrombus. He also has valvular dz as outlined below. He continues to defer referral to EP today for consideration of ICD given LVEF and sx as above.  Lifestyle changes discussed including increase of activity as tolerated, smoking cessation, and cessation of alcohol use. He does not wish to quit smoking his 3 black and mild cigarettes at this time. He reports occasional alcohol use but that he intends to stop this completely. He will attempt to increase activity as tolerated. Dietary changes also discussed, including glycemic control as below. Continue ASA, Warfarin (in lieu of ASA), Coreg, statin, lasix, and Imdur. Recheck CBC on warfarin and BMET/Mg on lasix. He did not fill his Imdur with refill to be called into the pharmacy. Encouraged daily weights and explained that this will assist with assessing volume status. Recommended he obtain a BP cuff. Reassess ICD at follow-up with consideration of known CAD and poor medication compliance with significantly reduced EF.   Essential Hypertension --BP suboptimal. Strict BP control  recommended with BP elevated today and possible medication changes discussed with patient report that BP elevated 2/2 pain today and with preference to defer medication changes at this time. Encouraged home BP checks. He does not have a BP cuff at this time and discussed obtaining one on [place]. He will call the office if obtains cuff and continued elevated pressures. Continue medications as above.   Palpitations, Racing HR Dizziness, DOE, SOB --As above. Reports intermittent dizziness, though in the setting of vasovagal etiology of pain or when locking his knees. Reports also SOB at rest with stressful thoughts. DOE with running up stairs. Discussed Zio given ectopy on EKG and s/sx reported with patient preference to defer. Discussed previous recommendation for ICD/EP evaluation. Prefers  to defer EP referral as well. Discussed recheck of renal function and electrolytes as well as TSH/FT4 given his ectopy / palpitations and with patient stating he will come back to get these labs as he needs to get home to his kids. Discussed CBC given his dizziness with patient preference to defer 2/2 needing to go home to his kids. If labs not obtained at RTC, recommend collection at that time.     Mural thrombus of cardiac apex    History of renal infarct --Reports warfarin compliance with current INR therapeutic at 2.3. Check CBC given intermittent dizziness. Stressed ongoing warfarin compliance / INR checks.  DM2 --He is not using his insulin. Stressed restart of insulin and glycemic control. Per PCP.  HLD --Stressed ongoing statin use, periodic recheck of lipids.  Current tobacco use --Smoking cessation advised. He does not wish to quit at this time with quitting resources discussed.  Current infectious ulcers --Possible that contributing to elevated BP. Recommend follow-up per PCP.   Disposition: Obtain BMET/Mg, CBC, TSH/FT4. RTC 1 mo. Continue to recommend referral to EP for ICD given CAD with low EF  and poor medication compliance. Continue to recommend smoking cessation. Encourage lifestyle changes and medication compliance.    Lennon Alstrom, PA-C 03/02/2019 3:00PM

## 2019-03-02 NOTE — Patient Instructions (Signed)
Medication Instructions:  RESUME Imdur 30 mg daily. An Rx has been sent to your pharmacy. *If you need a refill on your cardiac medications before your next appointment, please call your pharmacy*  Lab Work: Bmet. Cbc, Tsh, Magnesium  Please have your labs drawn upstairs at the [facility]  If you have labs (blood work) drawn today and your tests are completely normal, you will receive your results only by: Marland Kitchen MyChart Message (if you have MyChart) OR . A paper copy in the mail If you have any lab test that is abnormal or we need to change your treatment, we will call you to review the results.  Testing/Procedures: None ordered  Follow-Up: At [facility], you and your health needs are our priority.  As part of our continuing mission to provide you with exceptional heart care, we have created designated Provider Care Teams.  These Care Teams include your primary Cardiologist (physician) and Advanced Practice Providers (APPs -  Physician Assistants and Nurse Practitioners) who all work together to provide you with the care you need, when you need it.  Your next appointment:   4 week(s)  The format for your next appointment:   In Person  Provider:    You may see Dr. Mariah Milling or one of the following Advanced Practice Providers on your designated Care Team:    Nicolasa Ducking, NP  Eula Listen, PA-C  Marisue Ivan, PA-C   Other Instructions N/A

## 2019-03-12 ENCOUNTER — Encounter: Payer: Self-pay | Admitting: Family Medicine

## 2019-03-12 ENCOUNTER — Other Ambulatory Visit: Payer: Self-pay

## 2019-03-12 ENCOUNTER — Ambulatory Visit: Payer: Medicaid Other | Admitting: Family Medicine

## 2019-03-12 DIAGNOSIS — Z113 Encounter for screening for infections with a predominantly sexual mode of transmission: Secondary | ICD-10-CM | POA: Diagnosis not present

## 2019-03-12 DIAGNOSIS — Z202 Contact with and (suspected) exposure to infections with a predominantly sexual mode of transmission: Secondary | ICD-10-CM | POA: Diagnosis not present

## 2019-03-12 LAB — GRAM STAIN

## 2019-03-12 MED ORDER — METRONIDAZOLE 500 MG PO TABS: 2000.0000 mg | ORAL_TABLET | Freq: Once | ORAL | 0 refills | Status: AC

## 2019-03-12 NOTE — Progress Notes (Signed)
Here today for STD screening. Declines bloodwork. Brok Stocking, RN ? ?

## 2019-03-12 NOTE — Progress Notes (Signed)
[facility] Department STI clinic/screening visit  Subjective:  Joshua Tate is a 44 y.o. male being seen today for  Chief Complaint  Patient presents with  . SEXUALLY TRANSMITTED DISEASE     The patient reports they do not have symptoms.   Patient has the following medical conditions:   Patient Active Problem List   Diagnosis Date Noted  . Encounter for anticoagulation discussion and counseling 02/26/2016  . Encounter for therapeutic drug monitoring 12/17/2015  . Renal infarct ([place]) 12/03/2015  . Nausea & vomiting 12/03/2015  . Diarrhea 12/03/2015  . Unstable angina ([place])   . Scrotal abscess 11/10/2015  . Abnormal EKG 11/10/2015  . Hypertension 11/10/2015  . Diabetes mellitus ([place]) 11/10/2015  . Cardiomyopathy ([place]) 11/10/2015  . 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 Perirectal abscess 09/26/2015  . 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 Esophageal reflux 12/04/2008    HPI  Pt reports he is here as a contact to trich. Denies symptoms.  See flowsheet for further details and programmatic requirements.    No components found for: HCV  The following portions of the patient's history were reviewed and updated as appropriate: allergies, current medications, past medical history, past social history, past surgical history and problem list.  Objective:  There were no vitals filed for this visit.   Physical Exam Constitutional:      Appearance: Normal appearance.  HENT:     Head: Normocephalic and atraumatic.     Comments: No nits or hair loss    Mouth/Throat:     Mouth: Mucous membranes are moist.     Pharynx: Oropharynx is clear. No oropharyngeal exudate or posterior oropharyngeal erythema.  Pulmonary:     Effort: Pulmonary  effort is normal.  Abdominal:     General: Abdomen is flat.     Palpations: Abdomen is soft. There is no hepatomegaly or mass.     Tenderness: There is no abdominal tenderness.  Genitourinary:    Pubic Area: No rash or pubic lice.      Penis: Normal.      Testes:        Right: Mass, tenderness or swelling not present.        Left: Tenderness (mild) and swelling (open wound from hidradenitis furuncle) present. Mass not present.     Epididymis:     Right: Normal.     Left: Normal.     Rectum: Normal.  Lymphadenopathy:     Head:     Right side of head: No preauricular or posterior auricular adenopathy.     Left side of head: No preauricular or posterior auricular adenopathy.     Cervical: No cervical adenopathy.     Upper Body:     Right upper body: No supraclavicular or axillary adenopathy.     Left upper body: No supraclavicular or axillary adenopathy.     Lower Body: No right inguinal adenopathy. No left inguinal adenopathy.  Skin:    General: Skin is warm and dry.     Findings: No rash.  Neurological:     Mental Status: He is alert and oriented to person, place, and time.       Assessment and Plan:  Joshua Tate is a 44 y.o. male presenting to the [facility] Department for STI screening  1. Screening examination for venereal disease -Screenings today as below. Treat gram stain per standing order. -Patient does meet criteria for HepB, HepC Screening. Declines these screenings as well as HIV and syphilis screenings. -Counseled on warning s/sx and when to seek care. Recommended condom use with all sex and discussed importance of condom use for STI prevention. - Gram stain - Gonococcus culture  2. Trichomonas contact -Will treat as contact to Trich with Metronidazole 2g po once today. Advised to take with food, avoid alcohol for 24 hrs before - 72 hrs after taking medicine. RTC if vomits < 2 hr after taking medicine. -Advised no sex for 7 days until both  pt and partner completes treatment and encouraged condoms with all sex. -No known allergy to this medication, however pt is taking coumadin. I advised pt to call his coumadin provider before taking medication. - metroNIDAZOLE (FLAGYL) 500 MG tablet; Take 4 tablets (2,000 mg total) by mouth once for 1 dose.  Dispense: 4 tablet; Refill: 0    Return for screening as needed.  Future Appointments  Date Time Provider Department Center  03/19/2019  4:00 PM [facility]  03/30/2019  3:45 PM Marisue Ivan D, PA-C [facility]    Ann Held, PA-C

## 2019-03-16 LAB — GONOCOCCUS CULTURE

## 2019-03-19 ENCOUNTER — Ambulatory Visit (INDEPENDENT_AMBULATORY_CARE_PROVIDER_SITE_OTHER): Payer: Medicaid Other

## 2019-03-19 ENCOUNTER — Other Ambulatory Visit: Payer: Self-pay

## 2019-03-19 DIAGNOSIS — Z5181 Encounter for therapeutic drug level monitoring: Secondary | ICD-10-CM | POA: Diagnosis not present

## 2019-03-19 DIAGNOSIS — I513 Intracardiac thrombosis, not elsewhere classified: Secondary | ICD-10-CM

## 2019-03-19 LAB — POCT INR: INR: 4.2 — AB (ref 2.0–3.0)

## 2019-03-19 NOTE — Patient Instructions (Signed)
-   hold wafarin today AND HAVE A SERVING OF GREENS - Continue dosage of 2 tablets every day. - Recheck INR in 3 weeks.

## 2019-03-30 ENCOUNTER — Ambulatory Visit (INDEPENDENT_AMBULATORY_CARE_PROVIDER_SITE_OTHER): Payer: Medicaid Other | Admitting: Physician Assistant

## 2019-03-30 ENCOUNTER — Other Ambulatory Visit: Payer: Self-pay

## 2019-03-30 ENCOUNTER — Encounter: Payer: Self-pay | Admitting: Physician Assistant

## 2019-03-30 VITALS — BP 120/88 | HR 70 | Ht 72.0 in | Wt 205.0 lb

## 2019-03-30 DIAGNOSIS — I5020 Unspecified systolic (congestive) heart failure: Secondary | ICD-10-CM

## 2019-03-30 DIAGNOSIS — I255 Ischemic cardiomyopathy: Secondary | ICD-10-CM | POA: Diagnosis not present

## 2019-03-30 DIAGNOSIS — I2510 Atherosclerotic heart disease of native coronary artery without angina pectoris: Secondary | ICD-10-CM

## 2019-03-30 DIAGNOSIS — I340 Nonrheumatic mitral (valve) insufficiency: Secondary | ICD-10-CM

## 2019-03-30 DIAGNOSIS — I10 Essential (primary) hypertension: Secondary | ICD-10-CM | POA: Diagnosis not present

## 2019-03-30 DIAGNOSIS — R0600 Dyspnea, unspecified: Secondary | ICD-10-CM

## 2019-03-30 DIAGNOSIS — I513 Intracardiac thrombosis, not elsewhere classified: Secondary | ICD-10-CM

## 2019-03-30 DIAGNOSIS — R0609 Other forms of dyspnea: Secondary | ICD-10-CM

## 2019-03-30 DIAGNOSIS — I5022 Chronic systolic (congestive) heart failure: Secondary | ICD-10-CM

## 2019-03-30 DIAGNOSIS — I253 Aneurysm of heart: Secondary | ICD-10-CM

## 2019-03-30 DIAGNOSIS — Z720 Tobacco use: Secondary | ICD-10-CM

## 2019-03-30 DIAGNOSIS — R002 Palpitations: Secondary | ICD-10-CM

## 2019-03-30 DIAGNOSIS — Z7901 Long term (current) use of anticoagulants: Secondary | ICD-10-CM

## 2019-03-30 DIAGNOSIS — R000 Tachycardia, unspecified: Secondary | ICD-10-CM

## 2019-03-30 DIAGNOSIS — R0602 Shortness of breath: Secondary | ICD-10-CM

## 2019-03-30 DIAGNOSIS — E1169 Type 2 diabetes mellitus with other specified complication: Secondary | ICD-10-CM

## 2019-03-30 DIAGNOSIS — R791 Abnormal coagulation profile: Secondary | ICD-10-CM

## 2019-03-30 NOTE — Progress Notes (Signed)
Office Visit    Patient Name: Joshua Tate Date of Encounter: 03/30/2019  Primary Care Provider:  [facility] Primary Cardiologist:  Dr. 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   Past Medical History    Past Medical History:  Diagnosis Date  . Abscess    to rectum  . Hypertension    Past Surgical History:  Procedure Laterality Date  . CARDIAC CATHETERIZATION Left 11/17/2015   Procedure: Left Heart Cath and Coronary Angiography;  Surgeon: Iran Ouch, MD;  Location: [facility] INVASIVE CV LAB;  Service: Cardiovascular;  Laterality: Left;  . EYE SURGERY    . INCISION AND DRAINAGE ABSCESS N/A 09/26/2015   Procedure: INCISION AND DRAINAGE perirectal  ABSCESS;  Surgeon: Leafy Ro, MD;  Location: [facility] ORS;  Service: General;  Laterality: N/A;  . INCISION AND DRAINAGE ABSCESS N/A 11/10/2015   Procedure: INCISION AND DRAINAGE ABSCESS;  Surgeon: Vanna Scotland, MD;  Location: [facility] ORS;  Service: Urology;  Laterality: N/A;    Allergies  Allergies  Allergen Reactions  . Aspirin     Other reaction(s): Other (See Comments) pt states not allergic to asa.  . Latex Rash    History of Present Illness    In 11/2015 he was treated with IV heparin for possible mural thrombus and transition to ASA only per MD.  He was started on heart failure medications but declined inpatient cardiac cath as he wanted to complete this as an outpatient.  He had diagnostic cardiac cath completed with Dr. Kirke Corin, MD on 11/17/2015 that showed severe one-vessel CAD with an occluded distal LAD and right to left collaterals  from the RCA.  There was also an occluded D2 with faint collaterals and borderline disease of OM1.  Moderately elevated LVEDP.  LV angiography was not diagnostic and it was elected not to do prior injection given suspected LV thrombus.    Since then, he has been followed by both [facility] and the [facility] office.  On 03/18/2017, he underwent R/LHC. The LAD gave off 2 small and diffusely diseased diag branches before 100% occlusion. The m/dLAD and a very large diag branch (dual LAD anatomy) filled via L-L collaterals.  OM1 30- 40%s,  OM3 50%s, dLCx 40%s. The RCA was noted to be tortuous and 40% calcified stenosis of the dRCA / rPDA. RHC showed RVSP , PCWP mean 13 mmHg, Fick CO/CI as 5.6/2.6 and remainder of L/RHC details below. Medical management recommended, as well as secondary prevention.  [facility] TTE 05/01/2017 showed severe LVE, LVH, LAE, EF 10-15%. He saw his primary cardiologist in our clinic, Dr. Mariah Milling, on 03/22/2018 and was out of his current medications. It was noted that there was concern for medication compliance, including warfarin. Spironolactone was not recommended 2/2 need for lab compliance. Unspecified side effects were noted on Coreg. Notes indicate he declined ICD.  He was seen 09/08/18 by [facility] and started on spironolactone with Coreg dose increased. ICD was discussed again with patient stating he was willing to consider / think on and  information provided. Most recent 09/18/2018 echo showed severe LVE, mild LVH, EF 10 -15%, segmental wall motion abnormality (  apical) with apical aneurysm and LV thrombus, and mild MR. Limited echo was performed with contrast but reportedly unable to further quantify the burden of the LV thrombus on review of notes. He was continued on warfarin.  He was seen in the [place] office 03/02/2019 and reported Warfarin compliance with INR therapeutic. He noted DOE, specifically  when running up stairs. He had occasional SOB at rest, specifically when thinking  about something stressful. He felt dizziness with locking his knees or with vasovagal etiology, such as with pain 2/2 his ulcers of his LLE. He felt racing HR and palpitations with frequent ectopy on EKG, similar to the most recent EKG at [facility]. No LOC or syncope. No regular exercise program with plan to increase activity as tolerated. He was working to get a BP cuff and agreeable to daily weights and BP checks. He was smoking 3 black and mild per day without plan to quit. He rarely to occasionally drank alcohol. Labs ordered but deferred 2/2 needing to get home to his kids.    He presents today 03/30/2019 and is feeling well when compared with that of his previous visit.  He reports he no realizes he was ill at his previous visit, which contributed to some of his sx. He denies recent CP. He still reports DOE / SOB as outlined at his 03/02/19 visit. No sx of worsening HF, including orthopnea, abdominal distention, LEE, PND, or early satiety. He reports improved yet still ocassionally present presyncope, attributed to his recovery from his illness and also relief of pain 2/2 his LE ulcers that have healed since his 1/29 visit. No loss of consciousness or syncope. No s/sx of bleeding. BP today 120/88 and much improved, which he also attributes to his recovery / reduced pain.  He continues to note racing heart rate and present but less frequent palpitations. He continues to smoke 3 black & mild cigarettes per day without desire to quit, and he notes he still rarely drinks alcohol. He started his Imdur since our last visit and notes headache but (as outlined below) wants to trial it a little longer before trying a new medication. Medication compliance reported.   Home Medications    Prior to Admission medications   Medication Sig Start Date End Date Taking? Authorizing Provider  atorvastatin (LIPITOR) 40 MG tablet TAKE ONE TABLET BY MOUTH ONCE DAILY AT  6PM 05/27/16   Antonieta Iba, MD  carvedilol (COREG) 12.5  MG tablet Take 1 tablet (12.5 mg total) by mouth 2 (two) times daily with a meal. 05/27/16 03/02/19  Antonieta Iba, MD  furosemide (LASIX) 20 MG tablet Take 20 mg by mouth 2 (two) times daily. 12/30/17   [provider]  isosorbide mononitrate (IMDUR) 30 MG 24 hr tablet Take 1 tablet (30 mg total) by mouth daily. 03/02/19 03/01/20  Marisue Ivan D, PA-C  losartan (COZAAR) 100 MG tablet Take 1 tablet (100 mg total) by mouth daily. 05/27/16 03/02/19  Antonieta Iba, MD  Naproxen Sodium 220 MG CAPS Take by mouth.    [provider]  timolol (TIMOPTIC) 0.5 % ophthalmic solution Administer 1 drop into the left eye Two (2) times a day. 06/08/18 06/08/19  [provider]  warfarin (COUMADIN) 5 MG tablet Take as directed by the anti-coag clinic. 02/26/19   Antonieta Iba, MD    Review of Systems    He denies chest pain, pnd, orthopnea, n, v, syncope, edema, weight gain, or early satiety.  He reports racing  heart rate and improved but still present palpitations, stable dyspnea and SOB at rest, slightly improved dizziness, and new headache since starting Imdur.   All other systems reviewed and are otherwise negative except as noted above.  Physical Exam    VS:  BP 120/88 (BP Location: Left Arm, Patient Position: Sitting, Cuff Size: Normal)   Pulse 70   Ht 6' (1.829 m)   Wt 205 lb (93 kg)   SpO2 96%   BMI 27.80 kg/m  , BMI Body mass index is 27.8 kg/m. GEN: Well nourished, well developed, in no acute distress. HEENT: normal, chronic / known occipital enlargement  Neck: Supple, no JVD, carotid bruits, or masses. Cardiac: RRR, 1/6 systolic murmur. No rubs or gallops. No clubbing, cyanosis, edema.  Radials/DP/PT 2+ and equal bilaterally.  Respiratory:  Respirations regular and unlabored, coarse breath sounds bilaterally. GI: Soft, nontender, nondistended, BS + x 4. MS: no deformity or atrophy. Skin: warm and dry, no rash. Neuro:  Strength and sensation are  intact. Psych: Normal affect.  Accessory Clinical Findings    ECG personally reviewed by me today NSR, 70bpm, LAE, LVH, repolarization abnormalities as seen in previous EKGs, TWI in lateral leads and improved in inferior leads with resolved PVCs when compared with 1/29,  QTc 453, QRS 122- no acute changes from previous.  VITALS Reviewed   Temp Readings from Last 3 Encounters:  01/15/16 98.6 F (37 C) (Oral)  01/07/16 98.6 F (37 C) (Oral)  12/31/15 98.2 F (36.8 C) (Oral)   BP Readings from Last 3 Encounters:  03/30/19 120/88  03/02/19 (!) 150/80  03/22/18 (!) 142/82   Pulse Readings from Last 3 Encounters:  03/30/19 70  03/02/19 82  03/22/18 69    Wt Readings from Last 3 Encounters:  03/30/19 205 lb (93 kg)  03/02/19 208 lb (94.3 kg)  03/22/18 202 lb (91.6 kg)     LABS  reviewed   [facility]where Labs present? Yes/No: Yes   [facility] labs:  09/08/2018-Sodium 143, K3.9, CL 110, CO2 25, BUN 9, CR 0.83 04/11/2018 hemoglobin A1c 6.6 03/16/2010: Total cholesterol 188, triglycerides 148, HDL 53, non-HDL 135, LDL direct 116  Lab Results  Component Value Date   WBC 9.3 01/15/2016   HGB 13.1 01/15/2016   HCT 39.4 (L) 01/15/2016   MCV 87.7 01/15/2016   PLT 220 01/15/2016   Lab Results  Component Value Date   CREATININE 0.81 01/15/2016   BUN 7 01/15/2016   NA 140 01/15/2016   K 3.7 01/15/2016   CL 108 01/15/2016   CO2 26 01/15/2016   Lab Results  Component Value Date   ALT 19 12/03/2015   AST 24 12/03/2015   ALKPHOS 90 12/03/2015   BILITOT 0.9 12/03/2015   Lab Results  Component Value Date   CHOL 104 11/11/2015   HDL 42 11/11/2015   LDLCALC 54 11/11/2015   TRIG 38 11/11/2015   CHOLHDL 2.5 11/11/2015    Lab Results  Component Value Date   HGBA1C 5.7 (H) 11/11/2015   No results found for: TSH   STUDIES/PROCEDURES reviewed    09/18/18 Echo  Limited study to evaluate ventricular function  Ultrasound enhancing agent utilized to improve endocardial border   definition  Dilated left ventricle - severe  Left ventricular hypertrophy - mild  Severely decreased left ventricular systolic function, ejection fraction  10 to 15%  Segmental wall motion abnormality - (apical) with apical aneurysm and LV  thrombus.  Mitral regurgitation - mild  Normal right ventricular systolic  function   [facility] 03/18/2017 Cardiac Catheterization Laboratory [facility] [place], [place] Tel: [phone]  Fax: [phone] FINAL CARDIAC CATHETERIZATION REPORT Date of Procedure: 03/18/2017 Findings: 1. There is 100% mid LAD occlusion 2. Moderate, calcified stenosis of the RCA and LCx systems 3. Left heart cath deferred due to known LV thrombus 4. Right heart cath- RA mean 3, RV 30/4, PCWP mean 13 5. PA sat 67, Fick CO/CI 5.6/2.6 Recommendations:  Medical Management  Aggressive secondary prevention  ASA 81 mg daily indefinitely  F/u in cardiology clinic as scheduled Referring Physician: Aundra Dubin, MD Primary Cardiologist: Aundra Dubin, MD Primary Care Physician: [facility] Performing Attending: Hoy Finlay, MD Diagnostic Fellow: Berdine Dance, MD Interventional Fellow: N/A Procedures performed: Right Heart Catheterization and Coronary angiography Access Site: Right Radial Artery and Right Brachial Vein FINDINGS Right Heart Catheterization  Pressures  Right atrium Mean 3 mm Hg  Right ventricle Systolic 30 mm Hg, End-diastolic 4 mm Hg  Pulmonary artery Systolic 29 mm Hg, Diastolic 9 mm Hg, Mean 18 mm Hg  Pulmonary capillary wedge Mean 13 mm Hg  Arterial saturation: 96% Mixed venous saturation: 67% Fick cardiac output: 5.6 L/min Fick cardiac index: 2.6 L/min/m^2 Systemic vascular resistance (SVR): 0.89 dynes*s/cm^5 Hemodynamics Aortic pressure: 106/68 mm Hg (mean 86 mm Hg) Coronary Angiography  Dominance: Co dominant Left Main: The left main coronary artery (LMCA) is a large-caliber vessel  that  originates from the left coronary sinus. It bifurcates into the left  anterior descending (LAD) and left circumflex (LCx) arteries. There is no  angiographic evidence of significant disease in the LMCA. LAD: The LAD is a large-caliber vessel that gives off two small diffusely  diseased diagonal (D) branches before 100% occlusion. The mid and distal  LAD along with one very large diagonal branch (dual LAD anatomy) fill via  left to left collaterals. Left Circumflex: The LCx is a medium-caliber vessel that gives off three  obtuse marginal (OM) branches and a left PL branch. OM1 is a medium  caliber branching vessel supplying a large territory with diffuse proximal  disease up to 30-40%. OM2 is a tiny vessel. OM3 is a medium caliber  branching vessel supplying a medium sized territory with moderate diffuse  disease up to 50%. The left PL branch is small with mild diffuse disease.  The LCx otherwise has only mild luminal irregularities in the proximal and  mid vessel with moderate disease in the distal vessel up to 40%. Right Coronary: The right coronary artery (RCA) is a medium-caliber  tortuous vessel originating from the right coronary sinus. It bifurcates  distally into the posterior descending artery (PDA) and a posterolateral  (PL) branch consistent with a right dominant system. The rPDA gives off  several large septal branches. There is moderate (up to 40%) calcified  stenosis of the distal RCA and rPDA. The proximal and mix vessel have only  mild luminal irregularities. Left Coronary Berdine Dance, MD Cardiology Fellow, PGY6  [facility] 11/17/2015  Dist LAD lesion, 100 %stenosed.  Mid LAD to Dist LAD lesion, 30 %stenosed.  Dist Cx lesion, 40 %stenosed.  Ost 2nd Diag to 2nd Diag lesion, 100 %stenosed.  Ost 1st Mrg to 1st Mrg lesion, 70 %stenosed.  LV end diastolic pressure is moderately elevated. 1. Severe one-vessel coronary artery disease with occluded distal LAD with  right-to-left collaterals from the right coronary artery. Also occluded second diagonal with faint collaterals. Borderline disease in OM1. 2. Moderately elevated left ventricular end-diastolic pressure.  Left ventricular angiography was not diagnostic and I elected not to do power injection given suspected LV thrombus. Recommendations: Recommend medical therapy for coronary artery disease and ischemic cardiomyopathy. I increased the dose of carvedilol to 6.25 mg twice daily. The patient needs a small dose diuretic.  TTE 11/10/2015 - Left ventricle: The cavity size was moderately dilated. There was  mild concentric hypertrophy. Systolic function was severely  reduced. The estimated ejection fraction was in the range of 20%  to 25%. Akinesis of the mid-apicalanterior and anterolateral  myocardium. Dyskinesis of the apical myocardium. Features are  consistent with a pseudonormal left ventricular filling pattern,  with concomitant abnormal relaxation and increased filling  pressure (grade 2 diastolic dysfunction). There was severe  spontaneous echo contrast, indicative of stasis. Cannot exclude  apical thrombus.  - Aortic valve: There was mild regurgitation.  - Mitral valve: There was mild to moderate regurgitation.  - Left atrium: The atrium was moderately dilated.  - Right atrium: The atrium was mildly dilated.   Assessment & Plan    HFrEF (EF 10-15%, [facility] 09/2018 Echo) LV Thrombus / Apical Aneurysm CAD (2019 [facility] Cath) --No recent CP.  Stable DOE/SOB as reported at 1/29 visit.  Improved dizziness, ongoing racing HR. EKG without acute changes and resolution of PVCs captured on on 1/29 EKG with patient report of ongoing but improved palpitations. [facility] 09/2018 TTE with severely reduced LVEF 10 -15% and LV thrombus / apical aneurysm. Warfarin compliance confirmed; therefore, discussed repeat TTE to reassess previous findings with patient preference to defer at this time.  Discussed  transition from Losartan to [facility] for optimal GDMT and if BP / renal function allow, as well as if cost of medication reasonable. Will obtain BMET/Mg. Notes HA with Imdur but prefers to try the medication for longer before discontinuing. EP referral revisited today with patient agreement to schedule a visit after discussion of reasoning behind ICD for primary prevention. He is agreeable to meet with Dr. Caryl Comes / EP for further information, though he notes he is still undecided. Schedule appointment with EP. Smoking cessation/ diet and exercise discussed. Of note, he is followed by both [facility] and the [place] office and states today he is undecided regarding which to make his primary practice.   Supratherapeutic INR LV Thrombus / Apical Aneurysm --Last INR 4.2 and supratherapeutic. Check CBC with diff. Continue to follow with clinic / continue INR checks. Warfarin compliance confirmed. At RTC, revisit a repeat echo to reassess LV thrombus / apical aneurysm.  Essential Hypertension --BP improved from SBP 150 at previous visit with BP 120/88 and attributed to resolution of illness / pain as outlined above. Continue current medications for now. Encouraged home BP checks and lifestyle changes.  Palpitations, Racing HR, Presyncope --As above. Previously discussed Zio with patient preference to defer. Revisited today with decision to defer pending EP visit. Check electrolytes and FT4/TSH. Check CBC given dizziness and as warfarin -no reported s/sx consistent with bleeding reported today.   DM2 --Not on insulin. Last [facility] check of A1C 6.6. Defer to PCP.   HLD --Continue statin. Recheck lipids / LFTs.   Tobacco use --No current plan to quit. Smoking cessation advised. Call 1-800-QUIT-NOW (226) 130-1492) for help with quitting smoking.   Medication changes: No changes today. Future considerations include transition from Losartan to [facility] +/- discontinuing Imdur if continued HA.  Labs ordered:  BMET/Mg, TSH, CBC with diff, lipids/ dLDL, LFTs Studies / Imaging ordered: None. Future considerations include echo, Zio. Disposition: EP referral for  discussion /consideration of ICD. Continue INR checks / Coumadin clinic visits as scheduled. RTC 1 month or sooner if needed.  Total time spent with patient today 50 minutes. This includes reviewing records, evaluating the patient, and coordinating care. Face-to-face time >50%.    Lennon AlstromJacquelyn D Gwendolynn Merkey, PA-C 03/30/2019, 5:40 PM

## 2019-03-30 NOTE — Patient Instructions (Signed)
Medication Instructions:  Your physician recommends that you continue on your current medications as directed. Please refer to the Current Medication list given to you today.  *If you need a refill on your cardiac medications before your next appointment, please call your pharmacy*   Lab Work: Get labs upstairs, orders are in.   If you have labs (blood work) drawn today and your tests are completely normal, you will receive your results only by: Marland Kitchen MyChart Message (if you have MyChart) OR . A paper copy in the mail If you have any lab test that is abnormal or we need to change your treatment, we will call you to review the results.   Testing/Procedures: None ordered   Follow-Up: At [facility], you and your health needs are our priority.  As part of our continuing mission to provide you with exceptional heart care, we have created designated Provider Care Teams.  These Care Teams include your primary Cardiologist (physician) and Advanced Practice Providers (APPs -  Physician Assistants and Nurse Practitioners) who all work together to provide you with the care you need, when you need it.  We recommend signing up for the patient portal called "MyChart".  Sign up information is provided on this After Visit Summary.  MyChart is used to connect with patients for Virtual Visits (Telemedicine).  Patients are able to view lab/test results, encounter notes, upcoming appointments, etc.  Non-urgent messages can be sent to your provider as well.   To learn more about what you can do with MyChart, go to [email or web address].    Your next appointment:   4 week(s)  The format for your next appointment:   In Person  Provider:     You may see Dr. Mariah Milling or Marisue Ivan, PA-C.     Other Instructions Referral to EP

## 2019-04-19 ENCOUNTER — Ambulatory Visit: Payer: Medicaid Other | Admitting: Internal Medicine

## 2019-04-20 ENCOUNTER — Encounter: Payer: Self-pay | Admitting: Internal Medicine

## 2019-04-23 ENCOUNTER — Ambulatory Visit (INDEPENDENT_AMBULATORY_CARE_PROVIDER_SITE_OTHER): Payer: Medicaid Other

## 2019-04-23 ENCOUNTER — Other Ambulatory Visit: Payer: Self-pay

## 2019-04-23 DIAGNOSIS — Z5181 Encounter for therapeutic drug level monitoring: Secondary | ICD-10-CM | POA: Diagnosis not present

## 2019-04-23 DIAGNOSIS — I513 Intracardiac thrombosis, not elsewhere classified: Secondary | ICD-10-CM | POA: Diagnosis not present

## 2019-04-23 LAB — POCT INR: INR: 1.4 — AB (ref 2.0–3.0)

## 2019-04-23 NOTE — Patient Instructions (Signed)
-   take 3 tablets warfarin today & tomorrow - Continue warfarin dosage of 2 tablets every day. - Recheck INR in 3 weeks.

## 2019-05-05 NOTE — Progress Notes (Signed)
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 HTN,  DM  [facility] ED 11/10/2015 for evaluation of drainage from his scrotum and perineum, post-operative course  noted EKG changes along the anterolateral leads, Echocardiogram ejection fraction 20% with regions of wall motion abnormality, Medication noncompliance He presents today for follow-up of his ischemic cardiomyopathy He has 6 children some are grown, one in first grade  Last time he was seen in our clinic in 03/2018 Noncompliance with his warfarin and our Coumadin clinic  Last seen at [facility] cardiology August 2020 At that time start on spironolactone 12.5 daily At that time was on Lasix 20 twice daily, Lipitor 40, aspirin, losartan 100 daily, Coreg 25 twice daily  [facility] discussed ICD with him Still smoking  No recent echocardiogram in our system, last was October 2017 ejection fraction 20 to 25%  Echocardiogram [facility] August 2020 ejection fraction 10 to 15% Apical aneurysm, LV thrombus noted  Recurrence of his scrotal abscess October 2020 Had incision and drainage hydrinitis , recurrent  INR 4.2 one-month ago,on ABX Repeat 1.4 Has run out of warfarin 4 days  04/28/19 Went to [place], Sugars elevated,  "they did a heart cath",  Records pulled up from care everywhere and reviewed 100% mid LAD occlusion, moderate RCA L left circumflex disease Collaterals No significant change from March 2019 at [facility]  He denies any significant lower extremity edema, no abdominal bloating, no weight change He does not have a list of his medications with him today Reports having headache on spironolactone, is not taking this  Note from [facility] detailing Coreg 25 twice  daily Our notes have 12.5 twice daily, he does not know the dose Same as on a previous clinic visit  After running out of his warfarin likely secondary to no-shows at clinic visits  [facility]ocardi[facility] reviewed with him in detail Echo 05/01/2017  Dilated left ventricle - severe  Left ventricular hypertrophy - mild to moderate  Severely decreased left ventricular systolic function, ejection fraction  10 to 15%  Normal right ventricular systolic function  Dilated left atrium  EKG personally reviewed by myself on todays visit Shows normal sinus rhythm rate 80 bpm LVH, T wave abnormality V5, V6, 1 and aVL, 2, aVF , PVCs noted  Other records reviewed with him  cardiac catheterization 11/17/2015, dr. Shirlee Limerick LAD lesion, 100 %stenosed.  Mid LAD to Dist LAD lesion, 30 %stenosed.  Dist Cx lesion, 40 %stenosed.  Ost 2nd Diag to 2nd Diag lesion, 100 %stenosed.  Ost 1st Mrg to 1st Mrg lesion, 70 %stenosed.  LV end diastolic pressure is moderately elevated.  Medical management recommended for occluded distal LAD with right-to-left collaterals from the right coronary artery. Also occluded second diagonal with faint collaterals. Borderline disease in OM1.  Other past medical history reviewed Previoushospital admission for kidney embolism, presumably from mural thrombus Started on Lovenox, transition to warfarin   PMH:   has a past medical history of Abscess and Hypertension.  PSH:    Past Surgical History:  Procedure Laterality Date  . CARDIAC CATHETERIZATION Left 11/17/2015   Procedure: Left Heart Cath and Coronary Angiography;  Surgeon: Iran Ouch, MD;  Location: [facility] INVASIVE CV LAB;  Service: Cardiovascular;  Laterality: Left;  .  EYE SURGERY    . INCISION AND DRAINAGE ABSCESS N/A 09/26/2015   Procedure: INCISION AND DRAINAGE perirectal  ABSCESS;  Surgeon: Leafy Ro, MD;  Location: [facility] ORS;  Service: General;  Laterality: N/A;  . INCISION AND DRAINAGE  ABSCESS N/A 11/10/2015   Procedure: INCISION AND DRAINAGE ABSCESS;  Surgeon: Vanna Scotland, MD;  Location: [facility] ORS;  Service: Urology;  Laterality: N/A;    Current Outpatient Medications  Medication Sig Dispense Refill  . atorvastatin (LIPITOR) 40 MG tablet TAKE ONE TABLET BY MOUTH ONCE DAILY AT  6PM 90 tablet 3  . carvedilol (COREG) 12.5 MG tablet Take 1 tablet (12.5 mg total) by mouth 2 (two) times daily with a meal. 180 tablet 3  . furosemide (LASIX) 20 MG tablet Take 1 tablet (20 mg total) by mouth 2 (two) times daily. 180 tablet 3  . isosorbide mononitrate (IMDUR) 30 MG 24 hr tablet Take 1 tablet (30 mg total) by mouth daily. 90 tablet 3  . losartan (COZAAR) 100 MG tablet Take 1 tablet (100 mg total) by mouth daily. 90 tablet 3  . Naproxen Sodium 220 MG CAPS Take by mouth.    . timolol (TIMOPTIC) 0.5 % ophthalmic solution Administer 1 drop into the left eye Two (2) times a day.    . warfarin (COUMADIN) 5 MG tablet Take 2 tablets daily or as directed by the Anticoagulation Clinic. 180 tablet 3   No current facility-administered medications for this visit.    Allergies:   Aspirin and Latex   Social History:  The patient  reports that he has been smoking cigars. He has a 10.00 pack-year smoking history. He has never used smokeless tobacco. He reports current alcohol use. He reports current drug use. Frequency: 21.00 times per week. Drug: Marijuana.   Family History:   family history includes CAD in his mother; Diabetes Mellitus II in his mother; Hypertension in his mother; Kidney disease in his mother; Lung cancer in his maternal grandmother; Prostate cancer in his paternal grandfather.    Review of Systems: Review of Systems  Constitutional: Negative.   Respiratory: Negative.   Cardiovascular: Negative.   Gastrointestinal: Negative.   Genitourinary: Negative.   Musculoskeletal: Negative.   Neurological: Negative.   Psychiatric/Behavioral: Negative.   All other systems reviewed  and are negative.   PHYSICAL EXAM: VS:  BP 125/75 (BP Location: Left Arm)   Pulse 86   Ht 6' (1.829 m)   Wt 198 lb (89.8 kg)   SpO2 97%   BMI 26.85 kg/m  , BMI Body mass index is 26.85 kg/m. GEN: Well nourished, well developed, in no acute distress  HEENT: normal  Neck: no JVD, carotid bruits, or masses Cardiac: RRR; no murmurs, rubs, or gallops,no edema  Respiratory:  clear to auscultation bilaterally, normal work of breathing GI: soft, nontender, nondistended, + BS MS: no deformity or atrophy  Skin: warm and dry, no rash, old track marks on his arms bilaterally  Neuro:  Strength and sensation are intact Psych: euthymic mood, full affect   Recent Labs: No results found for requested labs within last 8760 hours.    Lipid Panel Lab Results  Component Value Date   CHOL 104 11/11/2015   HDL 42 11/11/2015   LDLCALC 54 11/11/2015   TRIG 38 11/11/2015      Wt Readings from Last 3 Encounters:  05/07/19 198 lb (89.8 kg)  03/30/19 205 lb (93 kg)  03/02/19 208 lb (94.3 kg)      ASSESSMENT AND  PLAN:  Essential hypertension Refills made for carvedilol, isosorbide, losartan Medication noncompliance especially with his warfarin We have refilled the warfarin given his LV thrombus seen on echocardiogram at [facility] -Does not want spironolactone, reports it causes a headache Discussed transitioning to Entresto at some point  Ischemic cardiomyopathy Recommended smoking cessation, still smoking  Lipitor refilled Denies anginal symptoms Recent catheterization results reviewed from procedure done in [place]  Mural thrombus of cardiac apex  stressed importance of compliance with his warfarin  He is high risk of recurrent mural thrombus, stroke  He is aware, warfarin refilled  Renal infarct (HCC) INR subtherapeutic Will restart today but strongly recommended he follow-up in clinic for warfarin refill  Perirectal abscess/hidradenitis Active issue, recent I&D Still healing  but slowly Recurrent issues dating back several years  Type 2 diabetes mellitus with other circulatory complication, without long-term current use of insulin (HCC) Markedly elevated sugars high 400s when he was at [facility] Did not feel well, was supposed to go to [place] but ended up in the hospital -Reports numbers are better now    Total encounter time more than 25 minutes  Greater than 50% was spent in counseling and coordination of care with the patient   Disposition:   F/U  6 months  No orders of the defined types were placed in this encounter.    Signed, Esmond Plants, M.D., Ph.D. 05/07/2019  [place], [place]

## 2019-05-07 ENCOUNTER — Other Ambulatory Visit: Payer: Self-pay

## 2019-05-07 ENCOUNTER — Encounter: Payer: Self-pay | Admitting: Cardiovascular Disease

## 2019-05-07 ENCOUNTER — Ambulatory Visit (INDEPENDENT_AMBULATORY_CARE_PROVIDER_SITE_OTHER): Payer: Medicaid Other | Admitting: Cardiovascular Disease

## 2019-05-07 VITALS — BP 125/75 | HR 86 | Ht 72.0 in | Wt 198.0 lb

## 2019-05-07 DIAGNOSIS — I513 Intracardiac thrombosis, not elsewhere classified: Secondary | ICD-10-CM

## 2019-05-07 DIAGNOSIS — I10 Essential (primary) hypertension: Secondary | ICD-10-CM | POA: Diagnosis not present

## 2019-05-07 DIAGNOSIS — I255 Ischemic cardiomyopathy: Secondary | ICD-10-CM | POA: Diagnosis not present

## 2019-05-07 DIAGNOSIS — I5020 Unspecified systolic (congestive) heart failure: Secondary | ICD-10-CM

## 2019-05-07 MED ORDER — ATORVASTATIN CALCIUM 40 MG PO TABS: ORAL_TABLET | ORAL | 3 refills | Status: AC

## 2019-05-07 MED ORDER — LOSARTAN POTASSIUM 100 MG PO TABS: 100.0000 mg | ORAL_TABLET | Freq: Every day | ORAL | 3 refills | Status: AC

## 2019-05-07 MED ORDER — FUROSEMIDE 20 MG PO TABS: 20.0000 mg | ORAL_TABLET | Freq: Two times a day (BID) | ORAL | 3 refills | Status: AC

## 2019-05-07 MED ORDER — WARFARIN SODIUM 5 MG PO TABS: ORAL_TABLET | ORAL | 3 refills | Status: DC

## 2019-05-07 MED ORDER — WARFARIN SODIUM 5 MG PO TABS: ORAL_TABLET | ORAL | 0 refills | Status: DC

## 2019-05-07 MED ORDER — ISOSORBIDE MONONITRATE ER 30 MG PO TB24: 30.0000 mg | ORAL_TABLET | Freq: Every day | ORAL | 3 refills | Status: AC

## 2019-05-07 MED ORDER — CARVEDILOL 12.5 MG PO TABS: 12.5000 mg | ORAL_TABLET | Freq: Two times a day (BID) | ORAL | 3 refills | Status: AC

## 2019-05-07 NOTE — Telephone Encounter (Signed)
Warfarin refill sent per request.

## 2019-05-07 NOTE — Patient Instructions (Addendum)
Medication Instructions:  We have sent in refill for warfarin  Please call and let us know the dose of the coreg/carvedilol (12.5 vs 25?)    If you need a refill on your cardiac medications before your next appointment, please call your pharmacy.    Lab work: No new labs needed   If you have labs (blood work) drawn today and your tests are completely normal, you will receive your results only by: Marland Kitchen MyChart Message (if you have MyChart) OR . A paper copy in the mail If you have any lab test that is abnormal or we need to change your treatment, we will call you to review the results.   Testing/Procedures: No new testing needed   Follow-Up: At [facility], you and your health needs are our priority.  As part of our continuing mission to provide you with exceptional heart care, we have created designated Provider Care Teams.  These Care Teams include your primary Cardiologist (physician) and Advanced Practice Providers (APPs -  Physician Assistants and Nurse Practitioners) who all work together to provide you with the care you need, when you need it.  . You will need a follow up appointment in 6 months   . Providers on your designated Care Team:   . Nicolasa Ducking, NP . Eula Listen, PA-C . Marisue Ivan, PA-C  Any Other Special Instructions Will Be Listed Below (If Applicable).  For educational health videos Log in to : [email or web address] Or : [email or web address], password : [email or web address]

## 2019-05-07 NOTE — Telephone Encounter (Signed)
*  STAT* If patient is at the pharmacy, call can be transferred to refill team.   1. Which medications need to be refilled? (please list name of each medication and dose if known) warfarin 5 MG  2. Which pharmacy/location (including street and city if local pharmacy) is medication to be sent to? [facility] on [address]   3. Do they need a 30 day or 90 day supply? 30 day

## 2019-05-08 ENCOUNTER — Other Ambulatory Visit: Payer: Self-pay

## 2019-05-08 MED ORDER — WARFARIN SODIUM 5 MG PO TABS: ORAL_TABLET | ORAL | 3 refills | Status: AC

## 2019-05-09 NOTE — Addendum Note (Signed)
Addended by: Thayer Headings, Dereon Corkery L on: 05/09/2019 01:41 PM   Modules accepted: Orders

## 2019-05-14 ENCOUNTER — Other Ambulatory Visit: Payer: Self-pay

## 2019-05-14 ENCOUNTER — Ambulatory Visit (INDEPENDENT_AMBULATORY_CARE_PROVIDER_SITE_OTHER): Payer: Medicaid Other

## 2019-05-14 DIAGNOSIS — Z5181 Encounter for therapeutic drug level monitoring: Secondary | ICD-10-CM

## 2019-05-14 DIAGNOSIS — I513 Intracardiac thrombosis, not elsewhere classified: Secondary | ICD-10-CM | POA: Diagnosis not present

## 2019-05-14 LAB — POCT INR: INR: 2.3 (ref 2.0–3.0)

## 2019-05-14 NOTE — Patient Instructions (Signed)
-   Continue warfarin dosage of 2 tablets every day. - Recheck INR in 4 weeks.

## 2019-06-11 ENCOUNTER — Ambulatory Visit (INDEPENDENT_AMBULATORY_CARE_PROVIDER_SITE_OTHER): Payer: Medicaid Other

## 2019-06-11 ENCOUNTER — Other Ambulatory Visit: Payer: Self-pay

## 2019-06-11 DIAGNOSIS — I513 Intracardiac thrombosis, not elsewhere classified: Secondary | ICD-10-CM | POA: Diagnosis not present

## 2019-06-11 DIAGNOSIS — Z5181 Encounter for therapeutic drug level monitoring: Secondary | ICD-10-CM

## 2019-06-11 LAB — POCT INR: INR: 3.9 — AB (ref 2.0–3.0)

## 2019-06-11 NOTE — Patient Instructions (Signed)
-   skip warfarin tomorrow, have a serving of greens today - Continue warfarin dosage of 2 tablets every day. - Recheck INR in 4 weeks.   - Try to pick a day or days to have your greens and have them on the same day each week

## 2019-06-13 NOTE — Progress Notes (Deleted)
ELECTROPHYSIOLOGY CONSULT NOTE  Patient ID: Joshua Tate, MRN: [id number], DOB/AGE: 45-Aug-1977 45 y.o. Admit date: (Not on file) Date of Consult: 06/13/2019  Primary Physician: [facility] Primary Cardiologist: ***     Joshua Tate is a 44 y.o. male who is being seen today for the evaluation of *** at the request of ***.    HPI Joshua Tate is a 44 y.o. male  Referred for consideration of an ICD in setting of ischemic cardiomyopathy*  DATE TEST EF   10/17 Echo   20 %   8/20 Echo (CE)  10-15% Apical aneurysm/CLOT  3/21 LHC (CE)   LAD-t; CX/RCA mod   Date Cr K Hgb  10/20 0.86 4.2 14.1  ***/*** *** *** ***   Hx of cardiac embolism to kidney   Past Medical History:  Diagnosis Date  . Abscess    to rectum  . Hypertension       Surgical History:  Past Surgical History:  Procedure Laterality Date  . CARDIAC CATHETERIZATION Left 11/17/2015   Procedure: Left Heart Cath and Coronary Angiography;  Surgeon: Iran Ouch, MD;  Location: [facility] INVASIVE CV LAB;  Service: Cardiovascular;  Laterality: Left;  . EYE SURGERY    . INCISION AND DRAINAGE ABSCESS N/A 09/26/2015   Procedure: INCISION AND DRAINAGE perirectal  ABSCESS;  Surgeon: Leafy Ro, MD;  Location: [facility] ORS;  Service: General;  Laterality: N/A;  . INCISION AND DRAINAGE ABSCESS N/A 11/10/2015   Procedure: INCISION AND DRAINAGE ABSCESS;  Surgeon: Vanna Scotland, MD;  Location: [facility] ORS;  Service: Urology;  Laterality: N/A;     Home Meds: No outpatient medications have been marked as taking for the 06/14/19 encounter (Appointment) with Duke Salvia, MD.    Allergies:  Allergies  Allergen Reactions  . Aspirin     Other reaction(s): Other (See Comments) pt states not allergic to asa.  . Latex Rash    Social History   Socioeconomic History  . Marital status: Single    Spouse name: Not on file  . Number of children: Not on file  . Years of education: Not on file  . Highest  education level: Not on file  Occupational History  . Not on file  Tobacco Use  . Smoking status: Current Every Day Smoker    Packs/day: 0.50    Years: 20.00    Pack years: 10.00    Types: Cigars  . Smokeless tobacco: Never Used  Substance and Sexual Activity  . Alcohol use: Yes    Comment: 1x/month  . Drug use: Yes    Frequency: 21.0 times per week    Types: Marijuana  . Sexual activity: Yes    Partners: Female  Other Topics Concern  . Not on file  Social History Narrative  . Not on file   Social Determinants of Health   Financial Resource Strain:   . Difficulty of Paying Living Expenses:   Food Insecurity:   . Worried About Programme researcher, broadcasting/film/video in the Last Year:   . Barista in the Last Year:   Transportation Needs:   . Freight forwarder (Medical):   Marland Kitchen Lack of Transportation (Non-Medical):   Physical Activity:   . Days of Exercise per Week:   . Minutes of Exercise per Session:   Stress:   . Feeling of Stress :   Social Connections:   . Frequency of Communication with Friends and Family:   .  Frequency of Social Gatherings with Friends and Family:   . Attends Religious Services:   . Active Member of Clubs or Organizations:   . Attends Archivist Meetings:   Marland Kitchen Marital Status:   Intimate Partner Violence:   . Fear of Current or Ex-Partner:   . Emotionally Abused:   Marland Kitchen Physically Abused:   . Sexually Abused:      Family History  Problem Relation Age of Onset  . CAD Mother        a. MI age 12  . Hypertension Mother   . Diabetes Mellitus II Mother   . Kidney disease Mother        Dialysis  . Lung cancer Maternal Grandmother   . Prostate cancer Paternal Grandfather   . Hematuria Neg Hx   . Bladder Cancer Neg Hx      ROS:  Please see the history of present illness.   {ros [id number]}  All other systems reviewed and negative.    Physical Exam:*** There were no vitals taken for this visit. General: Well developed, well nourished  male in no acute distress. Head: Normocephalic, atraumatic, sclera non-icteric, no xanthomas, nares are without discharge. EENT: normal  Lymph Nodes:  none Neck: Negative for carotid bruits. JVD not elevated. Back:without scoliosis kyphosis*** Lungs: Clear bilaterally to auscultation without wheezes, rales, or rhonchi. Breathing is unlabored. Heart: RRR with S1 S2. No *** ***/6 systolic*** murmur . No rubs, or gallops appreciated. Abdomen: Soft, non-tender, non-distended with normoactive bowel sounds. No hepatomegaly. No rebound/guarding. No obvious abdominal masses. Msk:  Strength and tone appear normal for age. Extremities: No clubbing or cyanosis. No*** ***+*** edema.  Distal pedal pulses are 2+ and equal bilaterally. Skin: Warm and Dry Neuro: Alert and oriented X 3. CN III-XII intact Grossly normal sensory and motor function . Psych:  Responds to questions appropriately with a normal affect.      Labs: Cardiac Enzymes No results for input(s): CKTOTAL, CKMB, TROPONINI in the last 72 hours. CBC Lab Results  Component Value Date   WBC 9.3 01/15/2016   HGB 13.1 01/15/2016   HCT 39.4 (L) 01/15/2016   MCV 87.7 01/15/2016   PLT 220 01/15/2016   PROTIME: Recent Labs    06/11/19 1557  INR 3.9*   Chemistry No results for input(s): NA, K, CL, CO2, BUN, CREATININE, CALCIUM, PROT, BILITOT, ALKPHOS, ALT, AST, GLUCOSE in the last 168 hours.  Invalid input(s): LABALBU Lipids Lab Results  Component Value Date   CHOL 104 11/11/2015   HDL 42 11/11/2015   LDLCALC 54 11/11/2015   TRIG 38 11/11/2015   BNP No results found for: PROBNP Thyroid Function Tests: No results for input(s): TSH, T4TOTAL, T3FREE, THYROIDAB in the last 72 hours.  Invalid input(s): FREET3 Miscellaneous No results found for: DDIMER  Radiology/Studies:  No results found.  EKG: ***   Assessment and Plan: *** Virl Axe

## 2019-06-14 ENCOUNTER — Ambulatory Visit: Payer: Medicaid Other | Admitting: Internal Medicine

## 2019-07-09 ENCOUNTER — Other Ambulatory Visit: Payer: Self-pay

## 2019-07-09 ENCOUNTER — Ambulatory Visit (INDEPENDENT_AMBULATORY_CARE_PROVIDER_SITE_OTHER): Payer: Medicaid Other

## 2019-07-09 DIAGNOSIS — I513 Intracardiac thrombosis, not elsewhere classified: Secondary | ICD-10-CM

## 2019-07-09 DIAGNOSIS — Z5181 Encounter for therapeutic drug level monitoring: Secondary | ICD-10-CM | POA: Diagnosis not present

## 2019-07-09 LAB — POCT INR: INR: 4.4 — AB (ref 2.0–3.0)

## 2019-07-09 NOTE — Patient Instructions (Addendum)
-   skip warfarin tomorrow, have a serving of greens today - START NEW warfarin dosage of 2 tablets every day EXCEPT 1 tablet on MONDAYS & FRIDAYS. - Recheck INR in 3 weeks.

## 2019-07-30 ENCOUNTER — Other Ambulatory Visit: Payer: Self-pay

## 2019-07-30 ENCOUNTER — Ambulatory Visit (INDEPENDENT_AMBULATORY_CARE_PROVIDER_SITE_OTHER): Payer: Medicaid Other

## 2019-07-30 DIAGNOSIS — I513 Intracardiac thrombosis, not elsewhere classified: Secondary | ICD-10-CM | POA: Diagnosis not present

## 2019-07-30 DIAGNOSIS — Z5181 Encounter for therapeutic drug level monitoring: Secondary | ICD-10-CM | POA: Diagnosis not present

## 2019-07-30 LAB — POCT INR: INR: 1.5 — AB (ref 2.0–3.0)

## 2019-07-30 NOTE — Patient Instructions (Signed)
-   take extra warfarin when you get home (for a total of 2 tabs today) - resume warfarin dosage of 2 tablets every day EXCEPT 1 tablet on MONDAYS & FRIDAYS. - Recheck INR in 3 weeks.   - Try to pick a day or days to have your greens and have them on the same day each week

## 2019-08-16 ENCOUNTER — Ambulatory Visit: Payer: Medicaid Other | Admitting: Internal Medicine

## 2019-08-20 ENCOUNTER — Encounter: Payer: Self-pay | Admitting: Internal Medicine

## 2019-09-12 ENCOUNTER — Telehealth: Payer: Self-pay

## 2019-09-12 NOTE — Telephone Encounter (Signed)
Attempted to contact pt to schedule overdue INR check. Pt was in ED @ [facility] 09/10/19. No answer, voicemail not set up.

## 2019-10-24 ENCOUNTER — Telehealth: Payer: Self-pay

## 2019-10-24 NOTE — Telephone Encounter (Signed)
Letter mailed to pt.  

## 2019-10-24 NOTE — Telephone Encounter (Signed)
Attempted to contact pt, as he is overdue for INR check (last check in our office 07/30/19). No answer, voicemail not set up, unable to leave message.

## 2020-03-04 DEATH — deceased
